# Patient Record
Sex: Female | Born: 1965
Health system: Southern US, Community
[De-identification: ages and names within clinical notes are randomized; demographics above are authoritative.]

## PROBLEM LIST (undated history)

## (undated) DIAGNOSIS — I493 Ventricular premature depolarization: Secondary | ICD-10-CM

## (undated) DIAGNOSIS — I4729 Other ventricular tachycardia: Secondary | ICD-10-CM

## (undated) DIAGNOSIS — R55 Syncope and collapse: Secondary | ICD-10-CM

## (undated) DIAGNOSIS — I700 Atherosclerosis of aorta: Secondary | ICD-10-CM

## (undated) DIAGNOSIS — E119 Type 2 diabetes mellitus without complications: Secondary | ICD-10-CM

## (undated) DIAGNOSIS — K219 Gastro-esophageal reflux disease without esophagitis: Secondary | ICD-10-CM

## (undated) DIAGNOSIS — N182 Chronic kidney disease, stage 2 (mild): Secondary | ICD-10-CM

## (undated) DIAGNOSIS — I480 Paroxysmal atrial fibrillation: Secondary | ICD-10-CM

## (undated) DIAGNOSIS — I4719 Other supraventricular tachycardia: Secondary | ICD-10-CM

## (undated) DIAGNOSIS — K5100 Ulcerative (chronic) pancolitis without complications: Secondary | ICD-10-CM

## (undated) DIAGNOSIS — I10 Essential (primary) hypertension: Secondary | ICD-10-CM

## (undated) DIAGNOSIS — Z780 Asymptomatic menopausal state: Secondary | ICD-10-CM

## (undated) DIAGNOSIS — R001 Bradycardia, unspecified: Secondary | ICD-10-CM

## (undated) DIAGNOSIS — M069 Rheumatoid arthritis, unspecified: Secondary | ICD-10-CM

## (undated) DIAGNOSIS — N952 Postmenopausal atrophic vaginitis: Secondary | ICD-10-CM

## (undated) DIAGNOSIS — G4733 Obstructive sleep apnea (adult) (pediatric): Secondary | ICD-10-CM

## (undated) DIAGNOSIS — K859 Acute pancreatitis without necrosis or infection, unspecified: Secondary | ICD-10-CM

## (undated) DIAGNOSIS — I471 Supraventricular tachycardia: Secondary | ICD-10-CM

## (undated) DIAGNOSIS — I2510 Atherosclerotic heart disease of native coronary artery without angina pectoris: Secondary | ICD-10-CM

## (undated) DIAGNOSIS — K8020 Calculus of gallbladder without cholecystitis without obstruction: Secondary | ICD-10-CM

## (undated) DIAGNOSIS — E785 Hyperlipidemia, unspecified: Secondary | ICD-10-CM

## (undated) DIAGNOSIS — B251 Cytomegaloviral hepatitis: Secondary | ICD-10-CM

## (undated) DIAGNOSIS — E669 Obesity, unspecified: Secondary | ICD-10-CM

## (undated) DIAGNOSIS — I491 Atrial premature depolarization: Secondary | ICD-10-CM

## (undated) DIAGNOSIS — K5190 Ulcerative colitis, unspecified, without complications: Secondary | ICD-10-CM

## (undated) DIAGNOSIS — D49519 Neoplasm of unspecified behavior of unspecified kidney: Secondary | ICD-10-CM

## (undated) HISTORY — DX: Gastro-esophageal reflux disease without esophagitis: K21.9

## (undated) HISTORY — DX: Obstructive sleep apnea (adult) (pediatric): G47.33

## (undated) HISTORY — DX: Cytomegaloviral hepatitis: B25.1

## (undated) HISTORY — DX: Essential (primary) hypertension: I10

## (undated) HISTORY — DX: Atherosclerosis of aorta: I70.0

## (undated) HISTORY — DX: Syncope and collapse: R55

## (undated) HISTORY — DX: Other ventricular tachycardia: I47.29

## (undated) HISTORY — DX: Other supraventricular tachycardia: I47.19

## (undated) HISTORY — DX: Type 2 diabetes mellitus without complications: E11.9

## (undated) HISTORY — DX: Obesity, unspecified: E66.9

## (undated) HISTORY — DX: Asymptomatic menopausal state: Z78.0

## (undated) HISTORY — DX: Bradycardia, unspecified: R00.1

## (undated) HISTORY — DX: Chronic kidney disease, stage 2 (mild): N18.2

## (undated) HISTORY — DX: Atrial premature depolarization: I49.1

## (undated) HISTORY — DX: Rheumatoid arthritis, unspecified: M06.9

## (undated) HISTORY — PX: CATARACT EXTRACTION: SUR2

## (undated) HISTORY — DX: Atherosclerotic heart disease of native coronary artery without angina pectoris: I25.10

## (undated) HISTORY — DX: Ulcerative (chronic) pancolitis without complications: K51.00

## (undated) HISTORY — DX: Hyperlipidemia, unspecified: E78.5

## (undated) HISTORY — DX: Acute pancreatitis without necrosis or infection, unspecified: K85.90

## (undated) HISTORY — DX: Ventricular premature depolarization: I49.3

## (undated) HISTORY — PX: EYE SURGERY: SHX253

## (undated) HISTORY — DX: Neoplasm of unspecified behavior of unspecified kidney: D49.519

## (undated) HISTORY — DX: Paroxysmal atrial fibrillation: I48.0

## (undated) HISTORY — DX: Calculus of gallbladder without cholecystitis without obstruction: K80.20

## (undated) HISTORY — DX: Supraventricular tachycardia: I47.1

## (undated) HISTORY — DX: Postmenopausal atrophic vaginitis: N95.2

## (undated) HISTORY — DX: Ulcerative colitis, unspecified, without complications: K51.90

---

## 2000-07-16 HISTORY — PX: DILATION AND CURETTAGE OF UTERUS: SHX78

## 2000-07-16 HISTORY — PX: TOTAL ABDOMINAL HYSTERECTOMY W/ BILATERAL SALPINGOOPHORECTOMY: SHX83

## 2003-07-17 DIAGNOSIS — B251 Cytomegaloviral hepatitis: Secondary | ICD-10-CM

## 2003-07-17 HISTORY — DX: Cytomegaloviral hepatitis: B25.1

## 2004-03-11 ENCOUNTER — Emergency Department (HOSPITAL_COMMUNITY): Admission: EM | Admit: 2004-03-11 | Discharge: 2004-03-12 | Payer: Self-pay | Admitting: Emergency Medicine

## 2004-08-01 ENCOUNTER — Encounter: Admission: RE | Admit: 2004-08-01 | Discharge: 2004-10-30 | Payer: Self-pay | Admitting: Family Medicine

## 2005-09-20 ENCOUNTER — Encounter: Admission: RE | Admit: 2005-09-20 | Discharge: 2005-09-20 | Payer: Self-pay | Admitting: Family Medicine

## 2005-11-10 ENCOUNTER — Emergency Department (HOSPITAL_COMMUNITY): Admission: EM | Admit: 2005-11-10 | Discharge: 2005-11-10 | Payer: Self-pay | Admitting: Emergency Medicine

## 2006-01-13 DIAGNOSIS — E119 Type 2 diabetes mellitus without complications: Secondary | ICD-10-CM | POA: Insufficient documentation

## 2006-01-13 DIAGNOSIS — N952 Postmenopausal atrophic vaginitis: Secondary | ICD-10-CM | POA: Insufficient documentation

## 2006-01-13 HISTORY — DX: Type 2 diabetes mellitus without complications: E11.9

## 2006-01-13 HISTORY — DX: Postmenopausal atrophic vaginitis: N95.2

## 2006-07-23 ENCOUNTER — Emergency Department (HOSPITAL_COMMUNITY): Admission: EM | Admit: 2006-07-23 | Discharge: 2006-07-23 | Payer: Self-pay | Admitting: Emergency Medicine

## 2006-10-17 ENCOUNTER — Encounter: Admission: RE | Admit: 2006-10-17 | Discharge: 2006-10-17 | Payer: Self-pay | Admitting: Obstetrics and Gynecology

## 2007-05-10 ENCOUNTER — Emergency Department (HOSPITAL_COMMUNITY): Admission: EM | Admit: 2007-05-10 | Discharge: 2007-05-10 | Payer: Self-pay | Admitting: Emergency Medicine

## 2007-07-18 ENCOUNTER — Emergency Department (HOSPITAL_COMMUNITY): Admission: EM | Admit: 2007-07-18 | Discharge: 2007-07-18 | Payer: Self-pay | Admitting: Emergency Medicine

## 2007-10-23 ENCOUNTER — Encounter: Admission: RE | Admit: 2007-10-23 | Discharge: 2007-10-23 | Payer: Self-pay | Admitting: General Practice

## 2007-11-03 ENCOUNTER — Encounter: Admission: RE | Admit: 2007-11-03 | Discharge: 2007-11-03 | Payer: Self-pay | Admitting: Family Medicine

## 2008-07-27 ENCOUNTER — Encounter: Admission: RE | Admit: 2008-07-27 | Discharge: 2008-07-27 | Payer: Self-pay | Admitting: Family Medicine

## 2009-10-25 ENCOUNTER — Encounter: Admission: RE | Admit: 2009-10-25 | Discharge: 2009-10-25 | Payer: Self-pay | Admitting: Obstetrics & Gynecology

## 2010-08-06 ENCOUNTER — Encounter: Payer: Self-pay | Admitting: Family Medicine

## 2010-09-07 ENCOUNTER — Emergency Department (HOSPITAL_BASED_OUTPATIENT_CLINIC_OR_DEPARTMENT_OTHER)
Admission: EM | Admit: 2010-09-07 | Discharge: 2010-09-07 | Disposition: A | Discharge status: Home / Self Care | Payer: 59 | Attending: Emergency Medicine | Admitting: Emergency Medicine

## 2010-09-07 DIAGNOSIS — R197 Diarrhea, unspecified: Secondary | ICD-10-CM | POA: Insufficient documentation

## 2010-09-07 DIAGNOSIS — K219 Gastro-esophageal reflux disease without esophagitis: Secondary | ICD-10-CM | POA: Insufficient documentation

## 2010-09-07 DIAGNOSIS — R112 Nausea with vomiting, unspecified: Secondary | ICD-10-CM | POA: Insufficient documentation

## 2010-09-07 DIAGNOSIS — E119 Type 2 diabetes mellitus without complications: Secondary | ICD-10-CM | POA: Insufficient documentation

## 2010-09-07 DIAGNOSIS — E7800 Pure hypercholesterolemia, unspecified: Secondary | ICD-10-CM | POA: Insufficient documentation

## 2010-09-07 LAB — URINALYSIS, ROUTINE W REFLEX MICROSCOPIC
Bilirubin Urine: NEGATIVE
Ketones, ur: 15 mg/dL — AB
Leukocytes, UA: NEGATIVE
Nitrite: NEGATIVE
Urine Glucose, Fasting: >1000 mg/dL — AB
Urobilinogen, UA: 0.2 mg/dL (ref 0.0–1.0)

## 2010-09-07 LAB — URINE MICROSCOPIC-ADD ON

## 2010-09-07 LAB — BASIC METABOLIC PANEL WITH GFR
BUN: 20 mg/dL (ref 6–23)
CO2: 28 meq/L (ref 19–32)
Calcium: 9 mg/dL (ref 8.4–10.5)
Chloride: 99 meq/L (ref 96–112)
Creatinine, Ser: 0.9 mg/dL (ref 0.4–1.2)
GFR calc Af Amer: >60 mL/min (ref >60)
GFR calc non Af Amer: >60 mL/min (ref >60)
Glucose, Bld: 337 mg/dL — ABNORMAL HIGH (ref 70–99)
Potassium: 4.4 meq/L (ref 3.5–5.1)
Sodium: 138 meq/L (ref 135–145)

## 2010-09-07 LAB — GLUCOSE, CAPILLARY
Glucose-Capillary: 365 mg/dL — ABNORMAL HIGH (ref 70–99)
Glucose-Capillary: 274 mg/dL — ABNORMAL HIGH (ref 70–99)

## 2010-11-28 ENCOUNTER — Other Ambulatory Visit: Payer: Self-pay | Admitting: Nurse Practitioner

## 2010-11-28 DIAGNOSIS — Z1231 Encounter for screening mammogram for malignant neoplasm of breast: Secondary | ICD-10-CM

## 2010-12-13 ENCOUNTER — Ambulatory Visit: Payer: 59

## 2010-12-13 ENCOUNTER — Other Ambulatory Visit: Payer: Self-pay | Admitting: Obstetrics & Gynecology

## 2010-12-13 ENCOUNTER — Ambulatory Visit
Admission: RE | Admit: 2010-12-13 | Discharge: 2010-12-13 | Disposition: A | Discharge status: Home / Self Care | Payer: 59 | Source: Ambulatory Visit | Attending: Nurse Practitioner | Admitting: Nurse Practitioner

## 2010-12-13 DIAGNOSIS — Z1231 Encounter for screening mammogram for malignant neoplasm of breast: Secondary | ICD-10-CM

## 2011-03-22 ENCOUNTER — Ambulatory Visit (HOSPITAL_COMMUNITY)
Admission: RE | Admit: 2011-03-22 | Discharge: 2011-03-22 | Disposition: A | Discharge status: Home / Self Care | Payer: 59 | Source: Ambulatory Visit | Attending: Rheumatology | Admitting: Rheumatology

## 2011-03-22 ENCOUNTER — Other Ambulatory Visit (HOSPITAL_COMMUNITY): Payer: Self-pay | Admitting: Rheumatology

## 2011-03-22 DIAGNOSIS — D869 Sarcoidosis, unspecified: Secondary | ICD-10-CM

## 2011-03-22 DIAGNOSIS — Z111 Encounter for screening for respiratory tuberculosis: Secondary | ICD-10-CM | POA: Insufficient documentation

## 2011-03-27 LAB — HEPATITIS PANEL, ACUTE: HM Hepatitis Screen: NEGATIVE

## 2011-04-05 LAB — BASIC METABOLIC PANEL
BUN: 16
Calcium: 9.2
Chloride: 101
Creatinine, Ser: 0.91
GFR calc Af Amer: >60
GFR calc non Af Amer: >60

## 2011-04-05 LAB — URINE MICROSCOPIC-ADD ON

## 2011-04-05 LAB — CBC
Hemoglobin: 13.3
MCHC: 34.3
Platelets: 228

## 2011-04-05 LAB — URINALYSIS, ROUTINE W REFLEX MICROSCOPIC
Leukocytes, UA: NEGATIVE
Urobilinogen, UA: 1

## 2011-04-05 LAB — DIFFERENTIAL
Eosinophils Absolute: 0
Lymphocytes Relative: 9 — ABNORMAL LOW
Lymphs Abs: 1
Monocytes Absolute: 0.4
Monocytes Relative: 4

## 2012-07-16 DIAGNOSIS — K859 Acute pancreatitis without necrosis or infection, unspecified: Secondary | ICD-10-CM

## 2012-07-16 HISTORY — DX: Acute pancreatitis without necrosis or infection, unspecified: K85.90

## 2012-08-06 ENCOUNTER — Other Ambulatory Visit: Payer: Self-pay | Admitting: Obstetrics & Gynecology

## 2012-08-06 DIAGNOSIS — Z1231 Encounter for screening mammogram for malignant neoplasm of breast: Secondary | ICD-10-CM

## 2012-08-08 ENCOUNTER — Ambulatory Visit
Admission: RE | Admit: 2012-08-08 | Discharge: 2012-08-08 | Disposition: A | Discharge status: Home / Self Care | Payer: 59 | Source: Ambulatory Visit | Attending: Obstetrics & Gynecology | Admitting: Obstetrics & Gynecology

## 2012-08-08 DIAGNOSIS — Z1231 Encounter for screening mammogram for malignant neoplasm of breast: Secondary | ICD-10-CM

## 2012-11-07 ENCOUNTER — Encounter: Payer: Self-pay | Admitting: *Deleted

## 2012-11-10 ENCOUNTER — Encounter: Payer: Self-pay | Admitting: Nurse Practitioner

## 2012-11-10 ENCOUNTER — Ambulatory Visit (INDEPENDENT_AMBULATORY_CARE_PROVIDER_SITE_OTHER): Payer: 59 | Admitting: Nurse Practitioner

## 2012-11-10 VITALS — BP 104/64 | Ht 64.75 in | Wt 232.0 lb

## 2012-11-10 DIAGNOSIS — Z01419 Encounter for gynecological examination (general) (routine) without abnormal findings: Secondary | ICD-10-CM

## 2012-11-10 DIAGNOSIS — N952 Postmenopausal atrophic vaginitis: Secondary | ICD-10-CM

## 2012-11-10 MED ORDER — ESTROGENS, CONJUGATED 0.625 MG/GM VA CREA: 1.0000 g | TOPICAL_CREAM | VAGINAL | Status: DC

## 2012-11-10 MED ORDER — ESTRADIOL 0.5 MG PO TABS: 0.5000 mg | ORAL_TABLET | Freq: Every day | ORAL | Status: DC

## 2012-11-10 NOTE — Patient Instructions (Addendum)

## 2012-11-10 NOTE — Progress Notes (Deleted)
47 y.o. G3P3 Married {Race/ethnicity:17218} Fe here for annual exam.    Patient's last menstrual period was 07/16/2000.          Sexually active: {yes no:314532}  The current method of family planning is {contraception:315051}.    Exercising: {yes no:314532}  {types:19826} Smoker:  {YES NO:22349}  Health Maintenance: Pap:  * MMG:  *** Colonoscopy:  *** BMD:   *** TDaP:  *** Labs: ***   reports that she has never smoked. She does not have any smokeless tobacco history on file. She reports that  drinks alcohol. She reports that she does not use illicit drugs.  Past Medical History  Diagnosis Date  . Depression 01/2006  . Atrophic vaginitis 01/2006  . Diabetes mellitus without complication 01/2006  . Viral hepatitis 2005  . Idiopathic edema 2010  . Hypertension     on meds just to protect kidneys. pt does not have high blood pressure    Past Surgical History  Procedure Laterality Date  . Dilation and curettage of uterus  2002  . Abdominal hysterectomy  2002  . Cesarean section      Current Outpatient Prescriptions  Medication Sig Dispense Refill  . aspirin 325 MG EC tablet Take 325 mg by mouth daily.      . Calcium-Vitamin D-Vitamin K (CALCIUM + D + K PO) Take by mouth daily.      Marland Kitchen estradiol (ESTRACE) 0.5 MG tablet Take 0.5 mg by mouth daily.      . Etanercept (ENBREL [place]) Inject into the skin every 14 (fourteen) days.       . Exenatide (BYETTA 5 MCG PEN [place]) Inject into the skin daily.      Marland Kitchen FOLIC ACID PO Take by mouth. Take 2 a day      . glipiZIDE (GLUCOTROL) 5 MG tablet Take 5 mg by mouth 2 (two) times daily before a meal.      . methotrexate (RHEUMATREX) 2.5 MG tablet Take by mouth. Take 4 pills every other week      . Multiple Vitamins-Minerals (MULTIVITAMIN PO) Take by mouth daily.      . RABEprazole Sodium (ACIPHEX PO) Take by mouth daily.      . ramipril (ALTACE) 5 MG tablet Take 5 mg by mouth daily.       No current facility-administered medications for this  visit.    Family History  Problem Relation Age of Onset  . Diabetes Mother   . Hypertension Mother   . Hypertension Father   . Hypertension Sister   . Diabetes Paternal Aunt   . Cancer Paternal Aunt     bone  . Cancer Maternal Grandmother     ovarian  . Diabetes Maternal Grandmother     ROS:  Pertinent items are noted in HPI.  Otherwise, a comprehensive ROS was negative.  Exam:   BP 104/64  Ht 5' 4.75" (1.645 m)  Wt 232 lb (105.235 kg)  BMI 38.89 kg/m2  LMP 07/16/2000  Weight change: Height: 5' 4.75" (164.5 cm)  Ht Readings from Last 3 Encounters:  11/10/12 5' 4.75" (1.645 m)    General appearance: alert, cooperative and appears stated age Head: Normocephalic, without obvious abnormality, atraumatic Neck: no adenopathy, supple, symmetrical, trachea midline and thyroid {EXAM; THYROID:18604} Lungs: clear to auscultation bilaterally Breasts: {Exam; breast:13139::"normal appearance, no masses or tenderness"} Heart: regular rate and rhythm Abdomen: soft, non-tender; no masses,  no organomegaly Extremities: extremities normal, atraumatic, no cyanosis or edema Skin: Skin color, texture, turgor normal. No  rashes or lesions Lymph nodes: Cervical, supraclavicular, and axillary nodes normal. No abnormal inguinal nodes palpated Neurologic: Grossly normal   Pelvic: External genitalia:  no lesions              Urethra:  normal appearing urethra with no masses, tenderness or lesions              Bartholin's and Skene's: normal                 Vagina: normal appearing vagina with normal color and discharge, no lesions              Cervix: {exam; cervix:14595}              Pap taken: {yes no:314532} Bimanual Exam:  Uterus:  {exam; uterus:12215}              Adnexa: {exam; adnexa:12223}               Rectovaginal: Confirms               Anus:  normal sphincter tone, no lesions  A:  Well Woman with normal exam  P:   Pap smear as per guidelines   {plan; gyn:5269::"mammogram","pap  smear","return annually or prn"}  An After Visit Summary was printed and given to the patient.

## 2012-11-10 NOTE — Progress Notes (Signed)
47 y.o. G3P3 Married Caucasian Fe here for annual exam.  Past week some increase in vaso symptoms that may be related to new introduction of essential oils.  She feels well on ERT and wants to continue.  Dyspareunia is much better on vaginal estrogen.  Patient's last menstrual period was 07/16/2000.          Sexually active: yes  The current method of family planning is status post hysterectomy.    Exercising: yes  treadmill & exercise machines Smoker:  no  Health Maintenance: Pap:  2003 normal MMG:  08/08/12 normal Colonoscopy:  none BMD:   10/17/06 T Score 0.8 spine and 0.6 hip TDaP:  Unknown (will check with PCP) Labs: none -done at PCP   reports that she has never smoked. She does not have any smokeless tobacco history on file. She reports that  drinks alcohol. She reports that she does not use illicit drugs.  Past Medical History  Diagnosis Date  . Depression 01/2006  . Atrophic vaginitis 01/2006  . Diabetes mellitus without complication 01/2006  . Viral hepatitis 2005  . Idiopathic edema 2010  . Hypertension     on meds just to protect kidneys. pt does not have high blood pressure    Past Surgical History  Procedure Laterality Date  . Dilation and curettage of uterus  2002  . Abdominal hysterectomy  2002  . Cesarean section      Current Outpatient Prescriptions  Medication Sig Dispense Refill  . aspirin 325 MG EC tablet Take 325 mg by mouth daily.      . Calcium-Vitamin D-Vitamin K (CALCIUM + D + K PO) Take by mouth daily.      Marland Kitchen estradiol (ESTRACE) 0.5 MG tablet Take 0.5 mg by mouth daily.      . Etanercept (ENBREL [place]) Inject into the skin every 14 (fourteen) days.       . Exenatide (BYETTA 5 MCG PEN [place]) Inject into the skin daily.      Marland Kitchen FOLIC ACID PO Take by mouth. Take 2 a day      . glipiZIDE (GLUCOTROL) 5 MG tablet Take 5 mg by mouth 2 (two) times daily before a meal.      . methotrexate (RHEUMATREX) 2.5 MG tablet Take by mouth. Take 4 pills every other week      .  Multiple Vitamins-Minerals (MULTIVITAMIN PO) Take by mouth daily.      . RABEprazole Sodium (ACIPHEX PO) Take by mouth daily.      . ramipril (ALTACE) 5 MG tablet Take 5 mg by mouth daily.       No current facility-administered medications for this visit.    Family History  Problem Relation Age of Onset  . Diabetes Mother   . Hypertension Mother   . Hypertension Father   . Hypertension Sister   . Diabetes Paternal Aunt   . Cancer Paternal Aunt     bone  . Cancer Maternal Grandmother     ovarian  . Diabetes Maternal Grandmother     ROS:  Pertinent items are noted in HPI.  Otherwise, a comprehensive ROS was negative.  Exam:   BP 104/64  Ht 5' 4.75" (1.645 m)  Wt 232 lb (105.235 kg)  BMI 38.89 kg/m2  LMP 07/16/2000 Height: 5' 4.75" (164.5 cm)  Ht Readings from Last 3 Encounters:  11/10/12 5' 4.75" (1.645 m)    General appearance: alert, cooperative and appears stated age Head: Normocephalic, without obvious abnormality, atraumatic Neck: no adenopathy,  supple, symmetrical, trachea midline and thyroid normal to inspection and palpation Lungs: clear to auscultation bilaterally Breasts: normal appearance, no masses or tenderness Heart: regular rate and rhythm Abdomen: soft, non-tender; no masses,  no organomegaly Extremities: extremities normal, atraumatic, no cyanosis or edema Skin: Skin color, texture, turgor normal. No rashes or lesions Lymph nodes: Cervical, supraclavicular, and axillary nodes normal. No abnormal inguinal nodes palpated Neurologic: Grossly normal   Pelvic: External genitalia:  no lesions              Urethra:  normal appearing urethra with no masses, tenderness or lesions              Bartholin's and Skene's: normal                 Vagina: normal appearing vagina with normal color and discharge, no lesions              Cervix: absent              Pap taken: no Bimanual Exam:  Uterus:  uterus absent              Adnexa: no mass, fullness,  tenderness               Rectovaginal: Confirms               Anus:  normal sphincter tone, no lesions  A:  Well Woman with normal exam  S/P TAH/ BSO secondary to AUB, on ERT  P:   Mammogram is due 07/2013  pap smear not indicated  Reviewed risk of ERT of heart disease, cancer, and CVA. Patient is aware and wants to continue  return annually or prn  An After Visit Summary was printed and given to the patient.

## 2012-11-11 NOTE — Progress Notes (Signed)
Encounter reviewed by Dr. Danyla Wattley Silva.  

## 2013-01-13 DIAGNOSIS — K8020 Calculus of gallbladder without cholecystitis without obstruction: Secondary | ICD-10-CM

## 2013-01-13 HISTORY — DX: Calculus of gallbladder without cholecystitis without obstruction: K80.20

## 2013-01-13 HISTORY — PX: CHOLECYSTECTOMY: SHX55

## 2013-01-13 HISTORY — PX: HERNIA REPAIR: SHX51

## 2013-02-13 HISTORY — PX: PARTIAL NEPHRECTOMY: SHX414

## 2013-03-16 DIAGNOSIS — D49519 Neoplasm of unspecified behavior of unspecified kidney: Secondary | ICD-10-CM

## 2013-03-16 HISTORY — PX: OTHER SURGICAL HISTORY: SHX169

## 2013-03-16 HISTORY — DX: Neoplasm of unspecified behavior of unspecified kidney: D49.519

## 2013-04-13 DIAGNOSIS — Z905 Acquired absence of kidney: Secondary | ICD-10-CM | POA: Insufficient documentation

## 2013-04-15 HISTORY — PX: OTHER SURGICAL HISTORY: SHX169

## 2013-05-29 ENCOUNTER — Other Ambulatory Visit: Payer: Self-pay

## 2013-05-29 DIAGNOSIS — Z1231 Encounter for screening mammogram for malignant neoplasm of breast: Secondary | ICD-10-CM

## 2013-07-16 DIAGNOSIS — K5100 Ulcerative (chronic) pancolitis without complications: Secondary | ICD-10-CM

## 2013-07-16 DIAGNOSIS — K5190 Ulcerative colitis, unspecified, without complications: Secondary | ICD-10-CM

## 2013-07-16 HISTORY — DX: Ulcerative (chronic) pancolitis without complications: K51.00

## 2013-07-16 HISTORY — DX: Ulcerative colitis, unspecified, without complications: K51.90

## 2013-07-16 HISTORY — PX: COLONOSCOPY: SHX174

## 2013-08-10 ENCOUNTER — Ambulatory Visit
Admission: RE | Admit: 2013-08-10 | Discharge: 2013-08-10 | Disposition: A | Discharge status: Home / Self Care | Payer: 59 | Source: Ambulatory Visit

## 2013-08-10 DIAGNOSIS — Z1231 Encounter for screening mammogram for malignant neoplasm of breast: Secondary | ICD-10-CM

## 2013-08-29 ENCOUNTER — Encounter: Payer: Self-pay | Admitting: Cardiology

## 2013-08-29 DIAGNOSIS — B251 Cytomegaloviral hepatitis: Secondary | ICD-10-CM | POA: Insufficient documentation

## 2013-09-03 ENCOUNTER — Ambulatory Visit: Payer: 59 | Admitting: Cardiology

## 2013-11-16 ENCOUNTER — Ambulatory Visit: Payer: 59 | Admitting: Nurse Practitioner

## 2013-11-16 ENCOUNTER — Encounter: Payer: Self-pay | Admitting: Nurse Practitioner

## 2014-01-20 ENCOUNTER — Emergency Department (HOSPITAL_COMMUNITY)
Admission: EM | Admit: 2014-01-20 | Discharge: 2014-01-20 | Disposition: A | Discharge status: Home / Self Care | Payer: 59 | Attending: Emergency Medicine | Admitting: Emergency Medicine

## 2014-01-20 ENCOUNTER — Encounter (HOSPITAL_COMMUNITY): Payer: Self-pay | Admitting: Emergency Medicine

## 2014-01-20 ENCOUNTER — Emergency Department (HOSPITAL_COMMUNITY): Payer: 59

## 2014-01-20 DIAGNOSIS — Z8619 Personal history of other infectious and parasitic diseases: Secondary | ICD-10-CM | POA: Insufficient documentation

## 2014-01-20 DIAGNOSIS — Z79899 Other long term (current) drug therapy: Secondary | ICD-10-CM | POA: Insufficient documentation

## 2014-01-20 DIAGNOSIS — Z8719 Personal history of other diseases of the digestive system: Secondary | ICD-10-CM | POA: Insufficient documentation

## 2014-01-20 DIAGNOSIS — Z8742 Personal history of other diseases of the female genital tract: Secondary | ICD-10-CM | POA: Insufficient documentation

## 2014-01-20 DIAGNOSIS — R112 Nausea with vomiting, unspecified: Secondary | ICD-10-CM

## 2014-01-20 DIAGNOSIS — R197 Diarrhea, unspecified: Secondary | ICD-10-CM

## 2014-01-20 DIAGNOSIS — E119 Type 2 diabetes mellitus without complications: Secondary | ICD-10-CM | POA: Insufficient documentation

## 2014-01-20 DIAGNOSIS — R1013 Epigastric pain: Secondary | ICD-10-CM | POA: Insufficient documentation

## 2014-01-20 DIAGNOSIS — F411 Generalized anxiety disorder: Secondary | ICD-10-CM | POA: Insufficient documentation

## 2014-01-20 DIAGNOSIS — R109 Unspecified abdominal pain: Secondary | ICD-10-CM

## 2014-01-20 DIAGNOSIS — Z7982 Long term (current) use of aspirin: Secondary | ICD-10-CM | POA: Insufficient documentation

## 2014-01-20 DIAGNOSIS — R911 Solitary pulmonary nodule: Secondary | ICD-10-CM

## 2014-01-20 LAB — URINE MICROSCOPIC-ADD ON

## 2014-01-20 LAB — CBC
HCT: 43 % (ref 36.0–46.0)
Hemoglobin: 14.6 g/dL (ref 12.0–15.0)
MCH: 29.4 pg (ref 26.0–34.0)
MCHC: 34 g/dL (ref 30.0–36.0)
MCV: 86.7 fL (ref 78.0–100.0)
Platelets: 276 10*3/uL (ref 150–400)
RBC: 4.96 MIL/uL (ref 3.87–5.11)
RDW: 15.6 % — AB (ref 11.5–15.5)
WBC: 21.4 10*3/uL — ABNORMAL HIGH (ref 4.0–10.5)

## 2014-01-20 LAB — COMPREHENSIVE METABOLIC PANEL
ALBUMIN: 3.8 g/dL (ref 3.5–5.2)
ALT: 31 U/L (ref 0–35)
AST: 26 U/L (ref 0–37)
Alkaline Phosphatase: 77 U/L (ref 39–117)
Anion gap: 19 — ABNORMAL HIGH (ref 5–15)
BUN: 25 mg/dL — ABNORMAL HIGH (ref 6–23)
CO2: 22 mEq/L (ref 19–32)
CREATININE: 1.24 mg/dL — AB (ref 0.50–1.10)
Calcium: 9.7 mg/dL (ref 8.4–10.5)
Chloride: 97 mEq/L (ref 96–112)
GFR calc non Af Amer: 51 mL/min — ABNORMAL LOW (ref >90)
GFR, EST AFRICAN AMERICAN: 59 mL/min — AB (ref >90)
Glucose, Bld: 356 mg/dL — ABNORMAL HIGH (ref 70–99)
Potassium: 3.7 mEq/L (ref 3.7–5.3)
Sodium: 138 mEq/L (ref 137–147)
Total Bilirubin: 0.5 mg/dL (ref 0.3–1.2)
Total Protein: 7.6 g/dL (ref 6.0–8.3)

## 2014-01-20 LAB — URINALYSIS, ROUTINE W REFLEX MICROSCOPIC
BILIRUBIN URINE: NEGATIVE
Glucose, UA: 100 mg/dL — AB
Hgb urine dipstick: NEGATIVE
Ketones, ur: NEGATIVE mg/dL
NITRITE: NEGATIVE
Protein, ur: NEGATIVE mg/dL
SPECIFIC GRAVITY, URINE: 1.021 (ref 1.005–1.030)
Urobilinogen, UA: 0.2 mg/dL (ref 0.0–1.0)
pH: 5 (ref 5.0–8.0)

## 2014-01-20 LAB — LIPASE, BLOOD: LIPASE: 26 U/L (ref 11–59)

## 2014-01-20 MED ORDER — EPINEPHRINE 0.3 MG/0.3ML IJ SOAJ: 0.3000 mg | INTRAMUSCULAR | Status: DC | PRN

## 2014-01-20 MED ORDER — ONDANSETRON 8 MG PO TBDP: ORAL_TABLET | ORAL | Status: DC

## 2014-01-20 MED ORDER — SODIUM CHLORIDE 0.9 % IV BOLUS (SEPSIS)
1000.0000 mL | Freq: Once | INTRAVENOUS | Status: AC
  Administered 2014-01-20: 1000 mL via INTRAVENOUS

## 2014-01-20 MED ORDER — ONDANSETRON HCL 4 MG/2ML IJ SOLN
4.0000 mg | Freq: Once | INTRAMUSCULAR | Status: AC
  Administered 2014-01-20: 4 mg via INTRAVENOUS
  Filled 2014-01-20: qty 2

## 2014-01-20 MED ORDER — HYDROXYZINE HCL 25 MG PO TABS: 25.0000 mg | ORAL_TABLET | Freq: Four times a day (QID) | ORAL | Status: DC

## 2014-01-20 MED ORDER — SODIUM CHLORIDE 0.9 % IV BOLUS (SEPSIS)
500.0000 mL | Freq: Once | INTRAVENOUS | Status: AC
  Administered 2014-01-20: 500 mL via INTRAVENOUS

## 2014-01-20 MED ORDER — LORAZEPAM 2 MG/ML IJ SOLN
1.0000 mg | Freq: Once | INTRAMUSCULAR | Status: AC
  Administered 2014-01-20: 0.5 mg via INTRAVENOUS
  Filled 2014-01-20: qty 1

## 2014-01-20 NOTE — ED Provider Notes (Signed)
Medical screening examination/treatment/procedure(s) were performed by non-physician practitioner and as supervising physician I was immediately available for consultation/collaboration.     Veryl Speak, MD 01/20/14 2312

## 2014-01-20 NOTE — ED Provider Notes (Signed)
Courtney Olson S 8:00 PM patient discussed in sign out. Patient with acute episodes of vomiting and watery diarrhea. 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 She also has past history of cholecystectomy and a right renal cyst removal. No recent travel or sick contacts. No fever. Patient does have elevated WBC. CT scan pending to rule out any concerning cause of symptoms.  9:30 PM CT scan discussed with attending physician. Nonspecific serous fluid collection around the liver. Very slight inflammation around the rectum without other signs of acute colitis. At this time may discuss fluid with GI for any thoughts.   9:40 PM spoke with [facility]. He feels fluid is most likely postsurgical from renal cyst removal one year ago and unlikely infectious in nature. He is unsure about possible "gastric anaphylaxis" but does state her some conditions when there is abdominal angioedema and swelling of the intestines which may release causing diarrhea but this is usually seen on CT. He does recommend adding a GI pathogen stool panel if patient is able to produce a sample. I will also plan to consult general surgery for any recommendations about CT scan findings.  10:05 PM spoke with Dr. Brantley Stage with general surgery. He has reviewed the CT scan we discussed patient's history. He feels this is probably a chronic fluid collection related to the surgery. He feels there is probably low chances this has become infected. He recommends that the patient have a recheck of symptoms and white blood cell count in the next one to 2 days. If it is not trending down he would recommend having IR collect fluid for sampling. Otherwise there is no recommendations for intervention on this finding.  10:15 PM I spoke with patient about CT scan findings including fluid collection which appears chronic post surgery as well a small 3 mm lung nodule. She continues to state that her symptoms feel most  similar to her "gastric anaphylaxis". She states she usually improves the best with Benadryl and hydroxyzine. Will provide a prescription for hydroxyzine as well as Zofran for nausea and vomiting. She will be on a soft diet. She has been instructed to have a recheck of her WBC and 1-2 days. Strict return precautions given.  Martie Lee, PA-C 01/20/14 2227

## 2014-01-20 NOTE — Discharge Instructions (Signed)
Rest. Drink plenty of fluids. Follow up with your primary care doctor tomorrow to have a recheck of your white blood cell count. Please also discussed with your primary care doctor followup regarding your small lung nodule.  Return to ER right away if worse, persistent vomiting, fevers, weak/faint, worsening or severe abdominal pain, other concern.   Also, follow up with your doctor to discuss facilitating referral to [facility] specialist, and Allergy specialist.     Nausea and Vomiting Nausea is a sick feeling that often comes before throwing up (vomiting). Vomiting is a reflex where stomach contents come out of your mouth. Vomiting can cause severe loss of body fluids (dehydration). Children and elderly adults can become dehydrated quickly, especially if they also have diarrhea. Nausea and vomiting are symptoms of a condition or disease. It is important to find the cause of your symptoms. CAUSES   Direct irritation of the stomach lining. This irritation can result from increased acid production (gastroesophageal reflux disease), infection, food poisoning, taking certain medicines (such as nonsteroidal anti-inflammatory drugs), alcohol use, or tobacco use.  Signals from the brain.These signals could be caused by a headache, heat exposure, an inner ear disturbance, increased pressure in the brain from injury, infection, a tumor, or a concussion, pain, emotional stimulus, or metabolic problems.  An obstruction in the gastrointestinal tract (bowel obstruction).  Illnesses such as diabetes, hepatitis, gallbladder problems, appendicitis, kidney problems, cancer, sepsis, atypical symptoms of a heart attack, or eating disorders.  Medical treatments such as chemotherapy and radiation.  Receiving medicine that makes you sleep (general anesthetic) during surgery. DIAGNOSIS Your caregiver may ask for tests to be done if the problems do not improve after a few days. Tests may also be done if symptoms are  severe or if the reason for the nausea and vomiting is not clear. Tests may include:  Urine tests.  Blood tests.  Stool tests.  Cultures (to look for evidence of infection).  X-rays or other imaging studies. Test results can help your caregiver make decisions about treatment or the need for additional tests. TREATMENT You need to stay well hydrated. Drink frequently but in small amounts.You may wish to drink water, sports drinks, clear broth, or eat frozen ice pops or gelatin dessert to help stay hydrated.When you eat, eating slowly may help prevent nausea.There are also some antinausea medicines that may help prevent nausea. HOME CARE INSTRUCTIONS   Take all medicine as directed by your caregiver.  If you do not have an appetite, do not force yourself to eat. However, you must continue to drink fluids.  If you have an appetite, eat a normal diet unless your caregiver tells you differently.  Eat a variety of complex carbohydrates (rice, wheat, potatoes, bread), lean meats, yogurt, fruits, and vegetables.  Avoid high-fat foods because they are more difficult to digest.  Drink enough water and fluids to keep your urine clear or pale yellow.  If you are dehydrated, ask your caregiver for specific rehydration instructions. Signs of dehydration may include:  Severe thirst.  Dry lips and mouth.  Dizziness.  Dark urine.  Decreasing urine frequency and amount.  Confusion.  Rapid breathing or pulse. SEEK IMMEDIATE MEDICAL CARE IF:   You have blood or brown flecks (like coffee grounds) in your vomit.  You have black or bloody stools.  You have a severe headache or stiff neck.  You are confused.  You have severe abdominal pain.  You have chest pain or trouble breathing.  You do not urinate  at least once every 8 hours.  You develop cold or clammy skin.  You continue to vomit for longer than 24 to 48 hours.  You have a fever. MAKE SURE YOU:   Understand these  instructions.  Will watch your condition.  Will get help right away if you are not doing well or get worse. Document Released: 07/02/2005 Document Revised: 09/24/2011 Document Reviewed: 11/29/2010 [facility] Patient Information 2015 [place], [place]. This information is not intended to replace advice given to you by your health care provider. Make sure you discuss any questions you have with your health care provider.     Diarrhea Diarrhea is frequent loose and watery bowel movements. It can cause you to feel weak and dehydrated. Dehydration can cause you to become tired and thirsty, have a dry mouth, and have decreased urination that often is dark yellow. Diarrhea is a sign of another problem, most often an infection that will not last long. In most cases, diarrhea typically lasts 2-3 days. However, it can last longer if it is a sign of something more serious. It is important to treat your diarrhea as directed by your caregive to lessen or prevent future episodes of diarrhea. CAUSES  Some common causes include:  Gastrointestinal infections caused by viruses, bacteria, or parasites.  Food poisoning or food allergies.  Certain medicines, such as antibiotics, chemotherapy, and laxatives.  Artificial sweeteners and fructose.  Digestive disorders. HOME CARE INSTRUCTIONS  Ensure adequate fluid intake (hydration): have 1 cup (8 oz) of fluid for each diarrhea episode. Avoid fluids that contain simple sugars or sports drinks, fruit juices, whole milk products, and sodas. Your urine should be clear or pale yellow if you are drinking enough fluids. Hydrate with an oral rehydration solution that you can purchase at pharmacies, retail stores, and online. You can prepare an oral rehydration solution at home by mixing the following ingredients together:   - tsp table salt.   tsp baking soda.   tsp salt substitute containing potassium chloride.  1  tablespoons sugar.  1 L (34 oz) of  water.  Certain foods and beverages may increase the speed at which food moves through the gastrointestinal (GI) tract. These foods and beverages should be avoided and include:  Caffeinated and alcoholic beverages.  High-fiber foods, such as raw fruits and vegetables, nuts, seeds, and whole grain breads and cereals.  Foods and beverages sweetened with sugar alcohols, such as xylitol, sorbitol, and mannitol.  Some foods may be well tolerated and may help thicken stool including:  Starchy foods, such as rice, toast, pasta, low-sugar cereal, oatmeal, grits, baked potatoes, crackers, and bagels.  Bananas.  Applesauce.  Add probiotic-rich foods to help increase healthy bacteria in the GI tract, such as yogurt and fermented milk products.  Wash your hands well after each diarrhea episode.  Only take over-the-counter or prescription medicines as directed by your caregiver.  Take a warm bath to relieve any burning or pain from frequent diarrhea episodes. SEEK IMMEDIATE MEDICAL CARE IF:   You are unable to keep fluids down.  You have persistent vomiting.  You have blood in your stool, or your stools are black and tarry.  You do not urinate in 6-8 hours, or there is only a small amount of very dark urine.  You have abdominal pain that increases or localizes.  You have weakness, dizziness, confusion, or lightheadedness.  You have a severe headache.  Your diarrhea gets worse or does not get better.  You have a fever or  persistent symptoms for more than 2-3 days.  You have a fever and your symptoms suddenly get worse. MAKE SURE YOU:   Understand these instructions.  Will watch your condition.  Will get help right away if you are not doing well or get worse. Document Released: 06/22/2002 Document Revised: 06/18/2012 Document Reviewed: 03/09/2012 [facility] Patient Information 2015 [place], [place]. This information is not intended to replace advice given to you by your health  care provider. Make sure you discuss any questions you have with your health care provider.     Abdominal Pain Many things can cause abdominal pain. Usually, abdominal pain is not caused by a disease and will improve without treatment. It can often be observed and treated at home. Your health care provider will do a physical exam and possibly order blood tests and X-rays to help determine the seriousness of your pain. However, in many cases, more time must pass before a clear cause of the pain can be found. Before that point, your health care provider may not know if you need more testing or further treatment. HOME CARE INSTRUCTIONS  Monitor your abdominal pain for any changes. The following actions may help to alleviate any discomfort you are experiencing:  Only take over-the-counter or prescription medicines as directed by your health care provider.  Do not take laxatives unless directed to do so by your health care provider.  Try a clear liquid diet (broth, tea, or water) as directed by your health care provider. Slowly move to a bland diet as tolerated. SEEK MEDICAL CARE IF:  You have unexplained abdominal pain.  You have abdominal pain associated with nausea or diarrhea.  You have pain when you urinate or have a bowel movement.  You experience abdominal pain that wakes you in the night.  You have abdominal pain that is worsened or improved by eating food.  You have abdominal pain that is worsened with eating fatty foods.  You have a fever. SEEK IMMEDIATE MEDICAL CARE IF:   Your pain does not go away within 2 hours.  You keep throwing up (vomiting).  Your pain is felt only in portions of the abdomen, such as the right side or the left lower portion of the abdomen.  You pass bloody or black tarry stools. MAKE SURE YOU:  Understand these instructions.   Will watch your condition.   Will get help right away if you are not doing well or get worse.  Document Released:  04/11/2005 Document Revised: 07/07/2013 Document Reviewed: 03/11/2013 [facility] Patient Information 2015 [place], [place]. This information is not intended to replace advice given to you by your health care provider. Make sure you discuss any questions you have with your health care provider.   Pulmonary Nodule  A pulmonary nodule is a small, round spot in your lung. It is usually found when pictures of your lungs are taken for other reasons. Most pulmonary nodules are not cancerous and do not cause symptoms. Tests will be done to make sure the nodule is not cancerous. Pulmonary nodules that are not cancerous usually do not require treatment. HOME CARE   Only take medicine as told by your doctor.  Follow up with your doctor as told. GET HELP IF:  You have trouble breathing when doing activities.  You feel sick or more tired than normal.  You do not feel like eating.  You lose weight without trying to.  You have chills.  You have night sweats. GET HELP RIGHT AWAY IF:  You cannot  catch your breath.  You start making whistling sounds when breathing (wheezing).  You have a cough that does not go away.  You cough up blood.  You are dizzy or feel like you are going to pass out.  You have sudden chest pain.  You have a fever or lasting symptoms for more than 2-3 days.  You have a fever and your symptoms suddenly get worse. MAKE SURE YOU:  Understand these instructions.  Will watch your condition.  Will get help right away if you are not doing well or get worse. Document Released: 08/04/2010 Document Revised: 03/04/2013 Document Reviewed: 12/22/2012 [facility] Patient Information 2015 [place], [place]. This information is not intended to replace advice given to you by your health care provider. Make sure you discuss any questions you have with your health care provider.
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
## 2014-01-20 NOTE — ED Notes (Signed)
Discharge instructions reviewed with pt. Pt verbalized understanding.   

## 2014-01-20 NOTE — ED Notes (Signed)
Pt presents to department via [facility] for evaluation of nausea/vomiting and diarrhea. History of GI issues. Hypotensive upon EMS arrival, 80/50. Received Epinephrine, Benadryl and Zantac. No relief of symptoms after receiving medications. Pt is alert and oriented x4. 20g L forearm.

## 2014-01-21 LAB — GI PATHOGEN PANEL BY PCR, STOOL
C difficile toxin A/B: NEGATIVE
CAMPYLOBACTER BY PCR: NEGATIVE
CRYPTOSPORIDIUM BY PCR: NEGATIVE
E COLI (ETEC) LT/ST: NEGATIVE
E COLI (STEC): NEGATIVE
E COLI 0157 BY PCR: NEGATIVE
G lamblia by PCR: NEGATIVE
Norovirus GI/GII: NEGATIVE
Rotavirus A by PCR: NEGATIVE
Salmonella by PCR: NEGATIVE
Shigella by PCR: NEGATIVE

## 2014-04-01 ENCOUNTER — Encounter: Payer: Self-pay | Admitting: Cardiology

## 2014-04-01 ENCOUNTER — Ambulatory Visit (INDEPENDENT_AMBULATORY_CARE_PROVIDER_SITE_OTHER): Payer: 59 | Admitting: Cardiology

## 2014-04-01 VITALS — BP 110/70 | HR 59 | Ht 64.75 in | Wt 201.0 lb

## 2014-04-01 DIAGNOSIS — Z0389 Encounter for observation for other suspected diseases and conditions ruled out: Secondary | ICD-10-CM

## 2014-04-01 DIAGNOSIS — G4733 Obstructive sleep apnea (adult) (pediatric): Secondary | ICD-10-CM | POA: Insufficient documentation

## 2014-04-01 DIAGNOSIS — R079 Chest pain, unspecified: Secondary | ICD-10-CM

## 2014-04-01 DIAGNOSIS — I10 Essential (primary) hypertension: Secondary | ICD-10-CM

## 2014-04-01 DIAGNOSIS — R072 Precordial pain: Secondary | ICD-10-CM | POA: Insufficient documentation

## 2014-04-01 DIAGNOSIS — E785 Hyperlipidemia, unspecified: Secondary | ICD-10-CM | POA: Insufficient documentation

## 2014-04-01 DIAGNOSIS — R55 Syncope and collapse: Secondary | ICD-10-CM

## 2014-04-01 DIAGNOSIS — K219 Gastro-esophageal reflux disease without esophagitis: Secondary | ICD-10-CM | POA: Insufficient documentation

## 2014-04-01 DIAGNOSIS — R002 Palpitations: Secondary | ICD-10-CM

## 2014-04-01 DIAGNOSIS — M069 Rheumatoid arthritis, unspecified: Secondary | ICD-10-CM | POA: Insufficient documentation

## 2014-04-01 NOTE — Patient Instructions (Signed)
Your physician recommends that you continue on your current medications as directed. Please refer to the Current Medication list given to you today.  Your physician has requested that you have an exercise tolerance test. For further information please visit [email or web address]. Please also follow instruction sheet, as given.  Your physician has requested that you have an echocardiogram. Echocardiography is a painless test that uses sound waves to create images of your heart. It provides your doctor with information about the size and shape of your heart and how well your heart's chambers and valves are working. This procedure takes approximately one hour. There are no restrictions for this procedure.  Your physician has recommended that you wear an event monitor. Event monitors are medical devices that record the heart's electrical activity. Doctors most often [place] these monitors to diagnose arrhythmias. Arrhythmias are problems with the speed or rhythm of the heartbeat. The monitor is a small, portable device. You can wear one while you do your normal daily activities. This is usually used to diagnose what is causing palpitations/syncope (passing out).  Your physician recommends that you schedule a follow-up appointment As Needed Pending Test results.

## 2014-04-01 NOTE — Progress Notes (Signed)
[place], [address], [place]  [address] Phone: [phone] Fax:  [phone]  Date:  04/01/2014   ID:  Courtney Olson, DOB [birth year], MRN [id number]  PCP:  Elton Sin, FNP  Cardiologist:  Fransico Him, MD     History of Present Illness: This is a 48yo female with a history of HTN, DM and remote history of chest pain with negative workup with stress test and echo.  She now presents back for followup.  She has had pancreatitis since I saw her last and was diagnosed with a kidney mass and underwent a large resection of a benign mass complicated by multiple infections.  This past July, she had an episode of severe abdominal pain with severe diarrhea and then became very diaphoretic with the pain and then got dizzy an d passed out. Apparently she has chronic issues with abdominal pain and diarrhea.   A CT scan was done to evaluate and she was also noted at that time to have calcifications of her abdominal aorta but no aneurysm.  She was concerned about the aorta and decided to followup with me.  She has had some anxiety attacks with associated chest pain after getting very upset about an abnormal CT scan done recently to followup on her kidney and is going to see a new urologist today.  Her symptoms have seemed to start after getting the CT scan.     Wt Readings from Last 3 Encounters:  04/01/14 201 lb (91.173 kg)  11/10/12 232 lb (105.235 kg)     Past Medical History  Diagnosis Date  . Atrophic vaginitis 01/2006  . Diabetes mellitus without complication 03/4495    on Ace inhibitor to protect kidneys  . Cytomegaloviral hepatitis 2005  . Hyperlipidemia   . Hypertension   . OSA (obstructive sleep apnea)   . RA (rheumatoid arthritis)   . GERD (gastroesophageal reflux disease)     Current Outpatient Prescriptions  Medication Sig Dispense Refill  . aspirin 325 MG EC tablet Take 325 mg by mouth daily.      Marland Kitchen EPINEPHrine (EPIPEN) 0.3 mg/0.3 mL IJ SOAJ injection Inject 0.3 mLs  (0.3 mg total) into the muscle as needed.  1 Device  0  . exenatide (BYETTA) 5 MCG/0.02ML SOPN injection Inject 10 mcg into the skin 2 (two) times daily with a meal.       . glipiZIDE (GLUCOTROL) 5 MG tablet Take 5 mg by mouth 2 (two) times daily before a meal.      . hydrochlorothiazide (MICROZIDE) 12.5 MG capsule Take 12.5 mg by mouth daily.       . hydrOXYzine (ATARAX/VISTARIL) 25 MG tablet Take 25 mg by mouth every 6 (six) hours. For Allergic reactions      . Multiple Vitamins-Minerals (MULTIVITAMIN PO) Take 1 tablet by mouth daily.       Marland Kitchen omeprazole (PRILOSEC) 40 MG capsule Take 40 mg by mouth daily.       . ondansetron (ZOFRAN ODT) 8 MG disintegrating tablet 8mg  ODT q4 hours prn nausea  20 tablet  0  . ramipril (ALTACE) 5 MG tablet Take 5 mg by mouth daily.      . Rosuvastatin Calcium (CRESTOR PO) Take 1 tablet by mouth daily.       No current facility-administered medications for this visit.    Allergies:    Allergies  Allergen Reactions  . Ibuprofen Nausea Only    Coating on tablet  . Orange Fruit [Citrus] Anaphylaxis  .  Tape Rash    Social History:  The patient  reports that she has never smoked. She does not have any smokeless tobacco history on file. She reports that she drinks alcohol. She reports that she does not use illicit drugs.   Family History:  The patient's family history includes Cancer in her maternal grandmother and paternal aunt; Diabetes in her father, maternal grandmother, mother, and paternal aunt; Heart disease in her father; Hypertension in her father, mother, and sister.   ROS:  Please see the history of present illness.      All other systems reviewed and negative.   PHYSICAL EXAM: VS:  BP 110/70  Pulse 59  Ht 5' 4.75" (1.645 m)  Wt 201 lb (91.173 kg)  BMI 33.69 kg/m2  LMP 07/16/2000 Well nourished, well developed, in no acute distress HEENT: normal Neck: no JVD Cardiac:  normal S1, S2; RRR; no murmur Lungs:  clear to auscultation bilaterally,  no wheezing, rhonchi or rales Abd: soft, nontender, no hepatomegaly Ext: no edema Skin: warm and dry Neuro:  CNs 2-12 intact, no focal abnormalities noted  EKG:  Sinus bradycardia with no ST changes     ASSESSMENT AND PLAN: 1.  Aortic calcifications with no aneurysm seen on abdominal CT- continue ASA 2.  Palpitations which are most likely related to panic attacks - event monitor to assess for arrhythmias 3.  Chest pain - atypical and is most likely related to panic attacks - ETT to rule out ischemia - 2D echo to assess LVF 4.  Syncope which sounds vasovagal triggered by abdominal pain - she has had several episodes like this in the past related to pain.  I explained to her that she needs to followup with her PCP to try to get her abdominal cramping under control.   5. Renal mass s/p resection with multiple complications and is followed by urology 6.  DM without complication 7.  HTN well controlled- continue HCTZ/Altace 8.  Dyslipidemia - continue Crestor  I spent over 35 minutes in direct patient care reviewing her CT scan reports and recent past medical history this year with multiple surgeries and hospitalizations.  She had multiple complaints today requiring significant time reviewing.  Followup PRN pending results of study  Signed, Fransico Him, MD 04/01/2014 9:30 AM

## 2014-04-06 ENCOUNTER — Encounter (INDEPENDENT_AMBULATORY_CARE_PROVIDER_SITE_OTHER): Payer: 59

## 2014-04-06 ENCOUNTER — Ambulatory Visit (HOSPITAL_COMMUNITY): Payer: 59 | Attending: Internal Medicine

## 2014-04-06 ENCOUNTER — Encounter: Payer: Self-pay | Admitting: *Deleted

## 2014-04-06 DIAGNOSIS — K219 Gastro-esophageal reflux disease without esophagitis: Secondary | ICD-10-CM | POA: Diagnosis not present

## 2014-04-06 DIAGNOSIS — M069 Rheumatoid arthritis, unspecified: Secondary | ICD-10-CM | POA: Insufficient documentation

## 2014-04-06 DIAGNOSIS — R002 Palpitations: Secondary | ICD-10-CM | POA: Diagnosis not present

## 2014-04-06 DIAGNOSIS — R072 Precordial pain: Secondary | ICD-10-CM | POA: Diagnosis not present

## 2014-04-06 DIAGNOSIS — I10 Essential (primary) hypertension: Secondary | ICD-10-CM | POA: Insufficient documentation

## 2014-04-06 DIAGNOSIS — R079 Chest pain, unspecified: Secondary | ICD-10-CM

## 2014-04-06 DIAGNOSIS — E119 Type 2 diabetes mellitus without complications: Secondary | ICD-10-CM | POA: Insufficient documentation

## 2014-04-06 DIAGNOSIS — I059 Rheumatic mitral valve disease, unspecified: Secondary | ICD-10-CM | POA: Insufficient documentation

## 2014-04-06 DIAGNOSIS — E785 Hyperlipidemia, unspecified: Secondary | ICD-10-CM | POA: Diagnosis not present

## 2014-04-06 DIAGNOSIS — G4733 Obstructive sleep apnea (adult) (pediatric): Secondary | ICD-10-CM | POA: Diagnosis not present

## 2014-04-06 DIAGNOSIS — R911 Solitary pulmonary nodule: Secondary | ICD-10-CM | POA: Diagnosis not present

## 2014-04-06 NOTE — Progress Notes (Signed)
2D Echo completed. 04/06/2014

## 2014-04-06 NOTE — Progress Notes (Signed)
Patient ID: Courtney Olson, female   DOB: 1966-01-11, 48 y.o.   MRN: [id number] [employer] 30 day cardiac event monitor applied to patient.

## 2014-04-15 ENCOUNTER — Telehealth (HOSPITAL_COMMUNITY): Payer: Self-pay

## 2014-04-15 NOTE — Telephone Encounter (Signed)
Encounter complete. 

## 2014-04-21 ENCOUNTER — Ambulatory Visit (HOSPITAL_COMMUNITY)
Admission: RE | Admit: 2014-04-21 | Discharge: 2014-04-21 | Disposition: A | Discharge status: Home / Self Care | Payer: 59 | Source: Ambulatory Visit | Attending: Cardiovascular Disease | Admitting: Cardiovascular Disease

## 2014-04-21 DIAGNOSIS — R079 Chest pain, unspecified: Secondary | ICD-10-CM | POA: Diagnosis present

## 2014-04-21 NOTE — Procedures (Signed)
Exercise Treadmill Test  Pre-Exercise Testing Evaluation   Test  Exercise Tolerance Test Ordering MD: Fransico Him, MD    Unique Test No: 1  Treadmill:  1  Indication for ETT: chest pain - rule out ischemia  Contraindication to ETT: No   Stress Modality: exercise - treadmill  Cardiac Imaging Performed: non   Protocol: standard Bruce - maximal  Max BP:  178/84  Max MPHR (bpm):  172 85% MPR (bpm):  146  MPHR obtained (bpm):  155 % MPHR obtained:  90  Reached 85% MPHR (min:sec):  6:55 Total Exercise Time (min-sec):  7:30  Workload in METS:  9.3 Borg Scale: 16  Reason ETT Terminated:  SOB and Leg fatigue    ST Segment Analysis At Rest: normal ST segments - no evidence of significant ST depression With Exercise: no evidence of significant ST depression  Other Information Arrhythmia:  No Angina during ETT:  absent (0) Quality of ETT:  diagnostic  ETT Interpretation:  normal - no evidence of ischemia by ST analysis  Comments: The patient had an fair exercise tolerance.  There was no chest pain.  There was an appropriate level of dyspnea.  There were rare PVCs, a normal heart rate response and normal BP response.  There were no ischemic ST T wave changes and a normal heart rate recovery.  The Duke score was 8 which puts him in the low risk category.   Recommendations: Negative stress test.  Further plans per Dr. Radford Pax.

## 2014-04-23 ENCOUNTER — Encounter: Payer: Self-pay | Admitting: *Deleted

## 2014-05-03 ENCOUNTER — Other Ambulatory Visit (HOSPITAL_COMMUNITY): Payer: Self-pay | Admitting: *Deleted

## 2014-05-03 ENCOUNTER — Other Ambulatory Visit (HOSPITAL_COMMUNITY): Payer: Self-pay | Admitting: Family Medicine

## 2014-05-03 DIAGNOSIS — Z87448 Personal history of other diseases of urinary system: Secondary | ICD-10-CM

## 2014-05-04 ENCOUNTER — Ambulatory Visit (INDEPENDENT_AMBULATORY_CARE_PROVIDER_SITE_OTHER): Payer: 59

## 2014-05-04 ENCOUNTER — Other Ambulatory Visit (HOSPITAL_COMMUNITY): Payer: Self-pay | Admitting: Family Medicine

## 2014-05-04 DIAGNOSIS — Z87448 Personal history of other diseases of urinary system: Secondary | ICD-10-CM

## 2014-05-04 DIAGNOSIS — Z9889 Other specified postprocedural states: Secondary | ICD-10-CM

## 2014-05-04 DIAGNOSIS — R1010 Upper abdominal pain, unspecified: Secondary | ICD-10-CM

## 2014-05-04 MED ORDER — IOHEXOL 300 MG/ML  SOLN
100.0000 mL | Freq: Once | INTRAMUSCULAR | Status: AC | PRN
Start: 2014-05-04 — End: 2014-05-04
  Administered 2014-05-04: 100 mL via INTRAVENOUS

## 2014-05-05 ENCOUNTER — Ambulatory Visit (HOSPITAL_COMMUNITY): Payer: 59

## 2014-05-14 ENCOUNTER — Telehealth: Payer: Self-pay | Admitting: Cardiology

## 2014-05-14 NOTE — Telephone Encounter (Signed)
Left message to call back.   Will attempt to call again before leaving the office today.

## 2014-05-14 NOTE — Telephone Encounter (Signed)
Please let patient know that heart monitor showed NSR with several episodes of paroxysmal atrial fibrillation, PVCs and atrial tachycardia.  She has a CHADS2VASC score of 3 (female/HTN/DM).  She needs to be on anticoagulation.  Please fine out if she has had any problems with bleeding anywhere.  If not then start on Xarelto 20mg  daily.  Recent 2D echo showed normal LVF.  Her heart monitor also showed some wide complex tachycardia that is most likely afib with aberration but cannot rule out VT.  Please get a lexiscan myoview to rule out ischemia.  She will need to stop ASA since she is starting Xarelto.  Please also have her start Toprol XL 25mg  daily.  I would like to see her back in the office in 1 week to see how she is doing.  I would also like her to come in on mOnday for a BMET, TSH and CBC for her Xarelto.

## 2014-05-16 DIAGNOSIS — R001 Bradycardia, unspecified: Secondary | ICD-10-CM

## 2014-05-16 HISTORY — DX: Bradycardia, unspecified: R00.1

## 2014-05-17 ENCOUNTER — Other Ambulatory Visit: Payer: Self-pay

## 2014-05-17 ENCOUNTER — Other Ambulatory Visit (INDEPENDENT_AMBULATORY_CARE_PROVIDER_SITE_OTHER): Payer: 59 | Admitting: *Deleted

## 2014-05-17 DIAGNOSIS — R002 Palpitations: Secondary | ICD-10-CM

## 2014-05-17 DIAGNOSIS — I10 Essential (primary) hypertension: Secondary | ICD-10-CM

## 2014-05-17 DIAGNOSIS — E785 Hyperlipidemia, unspecified: Secondary | ICD-10-CM

## 2014-05-17 LAB — CBC WITH DIFFERENTIAL/PLATELET
BASOS PCT: 0.7 % (ref 0.0–3.0)
Basophils Absolute: 0 10*3/uL (ref 0.0–0.1)
EOS PCT: 2.5 % (ref 0.0–5.0)
Eosinophils Absolute: 0.2 10*3/uL (ref 0.0–0.7)
HCT: 33.7 % — ABNORMAL LOW (ref 36.0–46.0)
Hemoglobin: 11.3 g/dL — ABNORMAL LOW (ref 12.0–15.0)
LYMPHS PCT: 26.1 % (ref 12.0–46.0)
Lymphs Abs: 1.9 10*3/uL (ref 0.7–4.0)
MCHC: 33.5 g/dL (ref 30.0–36.0)
MCV: 87.3 fl (ref 78.0–100.0)
Monocytes Absolute: 0.3 10*3/uL (ref 0.1–1.0)
Monocytes Relative: 4.3 % (ref 3.0–12.0)
NEUTROS PCT: 66.4 % (ref 43.0–77.0)
Neutro Abs: 4.7 10*3/uL (ref 1.4–7.7)
Platelets: 232 10*3/uL (ref 150.0–400.0)
RBC: 3.86 Mil/uL — AB (ref 3.87–5.11)
RDW: 15.4 % (ref 11.5–15.5)
WBC: 7.1 10*3/uL (ref 4.0–10.5)

## 2014-05-17 LAB — BASIC METABOLIC PANEL
BUN: 20 mg/dL (ref 6–23)
CO2: 28 mEq/L (ref 19–32)
Calcium: 9.5 mg/dL (ref 8.4–10.5)
Chloride: 100 mEq/L (ref 96–112)
Creatinine, Ser: 1.1 mg/dL (ref 0.4–1.2)
GFR: 54.5 mL/min — ABNORMAL LOW (ref >60.00)
GLUCOSE: 91 mg/dL (ref 70–99)
POTASSIUM: 4.4 meq/L (ref 3.5–5.1)
Sodium: 137 mEq/L (ref 135–145)

## 2014-05-17 LAB — LIPID PANEL
Cholesterol: 232 mg/dL — ABNORMAL HIGH (ref 0–200)
HDL: 55.5 mg/dL (ref >39.00)
LDL CALC: 142 mg/dL — AB (ref 0–99)
NonHDL: 176.5
TRIGLYCERIDES: 172 mg/dL — AB (ref 0.0–149.0)
Total CHOL/HDL Ratio: 4
VLDL: 34.4 mg/dL (ref 0.0–40.0)

## 2014-05-17 LAB — TSH: TSH: 2.39 u[IU]/mL (ref 0.35–4.50)

## 2014-05-17 LAB — HEMOGLOBIN A1C: HEMOGLOBIN A1C: 6.8 % — AB (ref 4.6–6.5)

## 2014-05-17 NOTE — Telephone Encounter (Addendum)
Informed patient of monitor findings, increased risk for stroke, and Dr. Theodosia Blender recommendations.  Patient is to have colonoscopy tomorrow to rule out cancer and bleeding ulcer-- no new medications ordered. Patient states she had recent stress test and does not want another one unless absolutely necessary.  Lab appointment made for today for BMET, CBC, TSH, A1C. Appointment made to see Dr. Radford Pax 05/28/14.

## 2014-05-17 NOTE — Telephone Encounter (Signed)
Left message to call back  

## 2014-05-17 NOTE — Telephone Encounter (Signed)
Patient had recent ETT that was normal so no need for nuclear stress test.  Please hold on starting Xarelto until GI issues are resolved.  Please have her call us to let us know what was found with colonoscopy

## 2014-05-17 NOTE — Telephone Encounter (Signed)
Informed patient no stress test will be ordered. Informed patient no new meds will be started until GI issues are resolved. Patient confirmed she will call us and let us know what is found with colonoscopy.  Patient had labs drawn this morning and has follow-up appointment next week.

## 2014-05-17 NOTE — Telephone Encounter (Signed)
Late entry 05/14/14 1820: Attempted to call back. No answer.

## 2014-05-18 ENCOUNTER — Telehealth: Payer: Self-pay

## 2014-05-18 NOTE — Telephone Encounter (Signed)
Left message on patient's private VM to call back tomorrow. Informed patient in message that labs are being sent to PCP for review and to call back tomorrow to verify Crestor dosages.

## 2014-05-18 NOTE — Telephone Encounter (Signed)
-----   Message from Sueanne Margarita, MD sent at 05/17/2014  3:58 PM EST ----- Please find out what dose of crestor patient is on.  Also forward labs to PCP to treat elevated Hbg A1C and anemia

## 2014-05-19 NOTE — Telephone Encounter (Signed)
Pt st she has not taken Crestor for 2 months -- she ran out and then her insurance denied coverage. Her PCP started her on 20 mg atorvastatin today.  Pt st her colonoscopy biopsies will not result for 7-10 days, but that her doctor says he thinks she has ulcerative colitis. She started 1.2 g Lialda qid today for treatment.   Patient asking about Xarelto and whether or not she should start for her A-fib seen on event monitor  Routing to Dr. Radford Pax for review and recommendations.

## 2014-05-19 NOTE — Telephone Encounter (Signed)
Follow up ° ° ° ° ° °Returning Katy's call °

## 2014-05-20 NOTE — Telephone Encounter (Signed)
Left message on pt's VM to call back and leave message with name of her GI MD.

## 2014-05-20 NOTE — Telephone Encounter (Signed)
Follow Up       Pt returning phone call from [facility]. Pt doesn't wish to leave name of GI MD in message and wants to speak to [facility]. Please call back and advise.

## 2014-05-20 NOTE — Telephone Encounter (Signed)
GI office closed.  Routing to triage to call Dr. Kathline Magic office at [phone] tomorrow for Xarelto clearance.

## 2014-05-20 NOTE — Telephone Encounter (Signed)
Patient st that her GI doctor is [facility]. Pt st that she is going to her PCP today -- she has blood in her urine.  Instructed patient to F/U with [place] after her PCP appointment.

## 2014-05-20 NOTE — Telephone Encounter (Signed)
Could you please let me know if patient is ok to start Xarelto after having colonoscopy with biopsy?

## 2014-05-20 NOTE — Telephone Encounter (Signed)
Please find out who her GI MD is and call them to see if and when it is safe to start Xarelto

## 2014-05-21 ENCOUNTER — Encounter: Payer: Self-pay | Admitting: Nurse Practitioner

## 2014-05-21 NOTE — Telephone Encounter (Signed)
Called Dr. Kathline Magic office & will need to call back later regarding the Xarelto. Contact person Dr. Kathline Magic assist: Ann Maki RN

## 2014-05-24 NOTE — Telephone Encounter (Signed)
Please call her Urologist office and find out if he is ok with patient going on Xarelto.  If ok then ,per GI,  have  patient start Xarelto on 06/04/2014.

## 2014-05-24 NOTE — Telephone Encounter (Signed)
Please check with Dr. Maceo Pro to make sure patient ok to take Xarelto

## 2014-05-24 NOTE — Telephone Encounter (Signed)
I spoke with Melissa at Dr. Kathline Magic office. Per Melissa, the patient had multiple small biopsy's done around the colon due to diarrhea. Dr. Michail Sermon felt like she may have Chron's disease or IBS. Biopsy results are pending. Per Melissa, when multiple biopsy's are done, it is safest to wait 2 weeks post procedure prior to restarting blood thinners if possible. The patient had her biopsies on 11/3 ( 2 weeks post = 11/17). Will forward to Dr. Radford Pax and Valetta Fuller to review.

## 2014-05-24 NOTE — Telephone Encounter (Signed)
Spoke with pt. She reports urologist was Dr. Pia Mau at [facility] in [place]. Pt reports she had many complications post surgery and is no longer seeing him.  Did see Dr. Vito Berger in Urology department at [facility] but was told she did not need to follow up with him.  Pt reports she saw Dr. Maceo Pro (primary care) today and lab tests showed no blood in her urine.  Pt reports no visable blood in her urine.

## 2014-05-24 NOTE — Telephone Encounter (Signed)
Spoke with Lattie Haw at Dr Delman Kitten office, [phone]. She is going to send a message to Dr Maceo Pro to get confirmation from him that it is OK for pt to take Xarelto.  We should expect call back today or tomorrow.

## 2014-05-26 ENCOUNTER — Telehealth: Payer: Self-pay

## 2014-05-26 NOTE — Telephone Encounter (Signed)
agree

## 2014-05-26 NOTE — Telephone Encounter (Signed)
Dr. Maceo Pro called and spoke with Dr. Radford Pax.  Per both doctors, patient is cleared to start Xarelto 20 mg daily starting June 04, 2014.   Will order and notify patient of new prescription.

## 2014-05-28 ENCOUNTER — Encounter: Payer: Self-pay | Admitting: Cardiology

## 2014-05-28 ENCOUNTER — Ambulatory Visit (INDEPENDENT_AMBULATORY_CARE_PROVIDER_SITE_OTHER): Payer: 59 | Admitting: Cardiology

## 2014-05-28 VITALS — BP 90/50 | HR 86 | Ht 65.0 in | Wt 195.2 lb

## 2014-05-28 DIAGNOSIS — I480 Paroxysmal atrial fibrillation: Secondary | ICD-10-CM

## 2014-05-28 DIAGNOSIS — R55 Syncope and collapse: Secondary | ICD-10-CM

## 2014-05-28 DIAGNOSIS — E785 Hyperlipidemia, unspecified: Secondary | ICD-10-CM

## 2014-05-28 DIAGNOSIS — I10 Essential (primary) hypertension: Secondary | ICD-10-CM

## 2014-05-28 MED ORDER — RAMIPRIL 2.5 MG PO CAPS: 2.5000 mg | ORAL_CAPSULE | Freq: Every day | ORAL | Status: DC

## 2014-05-28 MED ORDER — RIVAROXABAN 20 MG PO TABS: 20.0000 mg | ORAL_TABLET | Freq: Every day | ORAL | Status: DC

## 2014-05-28 NOTE — Patient Instructions (Addendum)
Your physician has recommended you make the following change in your medication: 1) START Xarelto 20 mg daily on November 20 2) STOP your Aspirin on November 19 3) STOP HCTZ 4) DECREASE Altace to 2.5 mg daily   Your physician recommends that you schedule a follow-up appointment in: 2 WEEKS with Richardson Dopp.  Your physician recommends that you schedule a follow-up appointment in: 6 weeks with Dr. Radford Pax.

## 2014-05-28 NOTE — Progress Notes (Signed)
[place], [place], [place]  [address] Phone: [phone] Fax:  [phone]  Date:  05/28/2014   ID:  Belinda Schlichting, DOB May 01, 1966, MRN [id number]  PCP:  Abigail Miyamoto, MD  Cardiologist:  Fransico Him, MD    History of Present Illness: This is a 48yo female with a history of HTN, DM and remote history of chest pain with negative workup with stress test and echo. She now presents back for followup. She has had pancreatitis since I saw her last and was diagnosed with a kidney mass and underwent a large resection of a benign mass complicated by multiple infections. This past July, she had an episode of severe abdominal pain with severe diarrhea and then became very diaphoretic with the pain and then got dizzy and passed out. Apparently she has chronic issues with abdominal pain and diarrhea. A CT scan was done to evaluate and she was also noted at that time to have calcifications of her abdominal aorta but no aneurysm. She was concerned about the aorta and decided to followup with me. She recently had a colonoscopy to workup her diarrhea and biopsies were taken and she was felt to have colitis.  She was having palpitations and a heart monitor showed PAF with RVR.  She also was having CP when I saw her and an ETT showed no ischemia.  2D echo showed normal LVF.  It was recommended that she start Xarelto for elevated CHADSVASC score and GI has recommended not starting this until 11/17 since she had colonic biopsies.  She denies any chest pain but has had some DOE.  She stopped exercising about 5 weeks ago and wants to start back slowly at the gym.   Wt Readings from Last 3 Encounters:  05/28/14 195 lb 3.2 oz (88.542 kg)  04/01/14 201 lb (91.173 kg)  11/10/12 232 lb (105.235 kg)     Past Medical History  Diagnosis Date  . Atrophic vaginitis 01/2006  . Diabetes mellitus without complication 12/9627    on Ace inhibitor to protect kidneys  . Cytomegaloviral hepatitis 2005  .  Hyperlipidemia   . Hypertension   . OSA (obstructive sleep apnea)   . RA (rheumatoid arthritis)   . GERD (gastroesophageal reflux disease)   . Syncope and collapse   . Obesity   . Postmenopausal     Current Outpatient Prescriptions  Medication Sig Dispense Refill  . aspirin 325 MG EC tablet Take 325 mg by mouth daily.    Marland Kitchen atorvastatin (LIPITOR) 20 MG tablet   5  . EPINEPHrine (EPIPEN) 0.3 mg/0.3 mL IJ SOAJ injection Inject 0.3 mLs (0.3 mg total) into the muscle as needed. 1 Device 0  . exenatide (BYETTA) 5 MCG/0.02ML SOPN injection Inject 10 mcg into the skin 2 (two) times daily with a meal.     . glipiZIDE (GLUCOTROL) 5 MG tablet Take 5 mg by mouth 2 (two) times daily before a meal.    . hydrochlorothiazide (MICROZIDE) 12.5 MG capsule Take 12.5 mg by mouth daily.     Marland Kitchen HYDROcodone-acetaminophen (NORCO/VICODIN) 5-325 MG per tablet Take 1 tablet by mouth every 6 (six) hours as needed for moderate pain.    . hydrOXYzine (ATARAX/VISTARIL) 25 MG tablet Take 25 mg by mouth every 6 (six) hours. For Allergic reactions    . mesalamine (LIALDA) 1.2 G EC tablet Take by mouth 4 (four) times daily.    . metaxalone (SKELAXIN) 800 MG tablet Take 800 mg by mouth as needed  for muscle spasms.    . Multiple Vitamins-Minerals (MULTIVITAMIN PO) Take 1 tablet by mouth daily.     Marland Kitchen omeprazole (PRILOSEC) 40 MG capsule Take 40 mg by mouth daily.     . ondansetron (ZOFRAN ODT) 8 MG disintegrating tablet 8mg  ODT q4 hours prn nausea 20 tablet 0  . ONETOUCH DELICA LANCETS 54S MISC by Does not apply route.    . ramipril (ALTACE) 5 MG tablet Take 5 mg by mouth daily.    . Rosuvastatin Calcium (CRESTOR PO) Take 1 tablet by mouth daily.     No current facility-administered medications for this visit.    Allergies:    Allergies  Allergen Reactions  . Ibuprofen Nausea Only    Coating on tablet  . Orange Fruit [Citrus] Anaphylaxis  . Bactrim [Sulfamethoxazole-Trimethoprim] Rash  . Tape Rash    Social  History:  The patient  reports that she has never smoked. She does not have any smokeless tobacco history on file. She reports that she drinks alcohol. She reports that she does not use illicit drugs.   Family History:  The patient's family history includes Cancer in her maternal grandmother and paternal aunt; Diabetes in her father, maternal grandmother, mother, and paternal aunt; Fibromyalgia in her mother; Heart disease in her father; Hypertension in her father, mother, and sister.   ROS:  Please see the history of present illness.      All other systems reviewed and negative.   PHYSICAL EXAM: VS:  BP 90/50 mmHg  Pulse 86  Ht 5\' 5"  (1.651 m)  Wt 195 lb 3.2 oz (88.542 kg)  BMI 32.48 kg/m2  SpO2 92%  LMP 07/16/2000 Well nourished, well developed, in no acute distress HEENT: normal Neck: no JVD Cardiac:  normal S1, S2; RRR; no murmur Lungs:  clear to auscultation bilaterally, no wheezing, rhonchi or rales Abd: soft, nontender, no hepatomegaly Ext: no edema Skin: warm and dry Neuro:  CNs 2-12 intact, no focal abnormalities noted     ASSESSMENT AND PLAN: 1. Aortic calcifications with no aneurysm seen on abdominal CT- continue ASA until she starts Xarelto and then she has been instructed to stop ASA 2. Palpitations with PAF noted on heart monitor.  Her CHADS2VASC score is 3 (female/DM).  She is going to start on the Xarelto on 06/04/2014 3. Chest pain - atypical and is most likely related to panic attack - no ischemia on ETT and normal LVF on 2D echo to assess LVF 4. Syncope which sounds vasovagal triggered by abdominal pain - she has had several episodes like this in the past related to pain.She has been seeing GI 5. Renal mass s/p resection with multiple complications and is followed by urology 6. DM without complication 7. HTN with soft BP today- I have recommended that we stop the HCTZ and decrease Altace to 2.5mg  daily.   8. Dyslipidemia - Her LDL was elevated at last check  but she had been off crestor for 3 months and Dr. Maceo Pro just put her on atorvastatin - recheck FLP and ALT in 6 weeks  Followup with PA in 2 weeks and if BP improved consider starting low dose BB to suppress PAF.  Followup with me in 6 weeks   Signed, Fransico Him, MD [facility] 05/28/2014 3:51 PM

## 2014-05-29 ENCOUNTER — Telehealth: Payer: Self-pay | Admitting: Physician Assistant

## 2014-05-29 NOTE — Telephone Encounter (Signed)
Courtney Olson is a 48 y.o. female with PAF.  Patient recently dx with UC. She has significant amounts of diarrhea.  She called in today with BPs as low as 60/45 with symptoms of near syncope.  She increased salt and fluids today.  Now BP 100/70. No BRBPR, fever, cough, chest pain or dyspnea. I have asked her to push fluids and salt today. Hold Altace. She has been advised to go to the urgent care or ED if BP remains < 90 systolic or she remains symptomatic. She agrees with this plan. Signed,  Richardson Dopp, PA-C   05/29/2014 3:22 PM

## 2014-06-09 ENCOUNTER — Telehealth: Payer: Self-pay | Admitting: Cardiology

## 2014-06-09 NOTE — Telephone Encounter (Signed)
Given small amount of blood and that it has resolved at this time, would not recommend holding Xarelto.  Would continue to monitor.

## 2014-06-09 NOTE — Telephone Encounter (Signed)
New problem   Pt need to know if she need to keep taking her Xarelto b/c of minor bleeding. Please call pt.

## 2014-06-09 NOTE — Telephone Encounter (Signed)
Patient started taking Xarelto this Sunday. Earlier this month, she had a colonoscopy where polyps were removed and she was diagnosed with ulcerative colitis.  Yesterday, she has a bowel movement and when she wiped, a small amount of frank blood was on the toilet paper and a little was seen in the toilet. Last night, she noticed if she pushed, a little tiny drop of blood came out. After driving to [facility] last night, when she got out of the car a little blood clot (about half the size of an eraser) came out of her anus.   Pt st she has recently started many new medications, including atorvastatin, amoxicillin (has 2 days left), a new DM drug, lialda, and xarelto.   She called her GI doc yesterday and they said they thought it might be the Xarelto, but for the patient to call us to be sure if she should hold the drug.   To Gay Filler for recommendations.

## 2014-06-09 NOTE — Telephone Encounter (Signed)
Follow Up        Pt calling stating there hasn't been any blood all night nor today.

## 2014-06-09 NOTE — Telephone Encounter (Signed)
Instructed patient to continue Xarelto but to monitor bleeding.  Patient st she has been in the car all day and has not used to bathroom so she does not know if it is still an issue.  Pt st she will call if bleeding is recurrent.

## 2014-06-14 ENCOUNTER — Ambulatory Visit (INDEPENDENT_AMBULATORY_CARE_PROVIDER_SITE_OTHER): Payer: 59 | Admitting: Physician Assistant

## 2014-06-14 ENCOUNTER — Encounter: Payer: Self-pay | Admitting: Physician Assistant

## 2014-06-14 VITALS — BP 130/70 | HR 68 | Ht 65.0 in | Wt 191.0 lb

## 2014-06-14 DIAGNOSIS — I700 Atherosclerosis of aorta: Secondary | ICD-10-CM | POA: Insufficient documentation

## 2014-06-14 DIAGNOSIS — R079 Chest pain, unspecified: Secondary | ICD-10-CM

## 2014-06-14 DIAGNOSIS — E119 Type 2 diabetes mellitus without complications: Secondary | ICD-10-CM

## 2014-06-14 DIAGNOSIS — E785 Hyperlipidemia, unspecified: Secondary | ICD-10-CM

## 2014-06-14 DIAGNOSIS — N2889 Other specified disorders of kidney and ureter: Secondary | ICD-10-CM

## 2014-06-14 DIAGNOSIS — R55 Syncope and collapse: Secondary | ICD-10-CM

## 2014-06-14 DIAGNOSIS — I480 Paroxysmal atrial fibrillation: Secondary | ICD-10-CM

## 2014-06-14 DIAGNOSIS — I10 Essential (primary) hypertension: Secondary | ICD-10-CM

## 2014-06-14 MED ORDER — METOPROLOL SUCCINATE ER 25 MG PO TB24: 12.5000 mg | ORAL_TABLET | Freq: Every day | ORAL | Status: DC

## 2014-06-14 MED ORDER — METOPROLOL SUCCINATE ER 25 MG PO TB24: 12.5000 mg | ORAL_TABLET | Freq: Every evening | ORAL | Status: DC

## 2014-06-14 MED ORDER — RIVAROXABAN 20 MG PO TABS: 20.0000 mg | ORAL_TABLET | Freq: Every day | ORAL | Status: DC

## 2014-06-14 NOTE — Patient Instructions (Signed)
START TOPROL XL 25 MG TABLET; YOU WILL BE TAKING 1/2 TAB EVERY EVENING TO = 12.5 MG   A REFILL FOR XARELTO HAS BEEN SENT IN  KEEP YOUR FOLLOW UP WITH DR. Radford Pax 07/13/14 AS PLANNED

## 2014-06-14 NOTE — Progress Notes (Signed)
Cardiology Office Note   Date:  06/14/2014   ID:  Courtney Olson, DOB 1965/08/02, MRN [id number]  PCP:  Abigail Miyamoto, MD  Cardiologist:  Dr. Fransico Him     History of Present Illness: Courtney Olson is a 48 y.o. female with a history of HTN, DM and remote history of chest pain with negative workup with stress test and echo. She has had pancreatitis and was diagnosed with a kidney mass and underwent a large resection of a benign mass complicated by multiple infections. This past July, she had an episode of severe abdominal pain with severe diarrhea and then became very diaphoretic with the pain and then got dizzy and passed out. Apparently she has chronic issues with abdominal pain and diarrhea. A CT scan was done to evaluate and she was also noted at that time to have calcifications of her abdominal aorta but no aneurysm. She was concerned about the aorta and decided to followup with Dr. Radford Pax. She recently had a colonoscopy to workup her diarrhea and biopsies were taken and she was felt to have ulcerative colitis. She was having palpitations and a heart monitor showed PAF with RVR. She also was having CP and an ETT showed no ischemia. 2D echo showed normal LVF. It was recommended that she start Xarelto for elevated CHADSVASC score (3 - HTN, DM, female) and GI has recommended not starting this until 11/17 since she had colonic biopsies.    Last seen by Dr. Fransico Him 05/28/14.  BP was running low at that time and her medications were adjusted.  She is brought back today with an eye towards starting low dose BB to suppress PAF if her BP will allow.  Since last seen, she is doing well.  Her diarrhea is much improved.  She notes that her BP is better.  She denies chest pain.  She has not been exercising as much as she has in the past.  She notes DOE with more moderate to extreme activities.  She denies orthopnea, PND, edema.  She denies syncope.  She continues to note palpitations, mainly at  night.   Studies:  - Echo (9/15):  Image quality was poor. EF 55% to 60%. Wall motion was normal; Grade 1 diastolic dysfunction  - Nuclear (07/2012):  No ischemia, EF 82%; Normal   - ETT (10/15):  Negative    Recent Labs: 01/20/2014: ALT 31 05/17/2014: BUN 20; Creatinine 1.1; Hemoglobin 11.3*; LDL (calc) 142*; Potassium 4.4; Sodium 137; TSH 2.39    Recent Radiology: No results found.    Wt Readings from Last 3 Encounters:  06/14/14 191 lb (86.637 kg)  05/28/14 195 lb 3.2 oz (88.542 kg)  04/01/14 201 lb (91.173 kg)     Past Medical History  Diagnosis Date  . Atrophic vaginitis 01/2006  . Diabetes mellitus without complication 10/979    on Ace inhibitor to protect kidneys  . Cytomegaloviral hepatitis 2005  . Hyperlipidemia   . Hypertension   . OSA (obstructive sleep apnea)   . RA (rheumatoid arthritis)   . GERD (gastroesophageal reflux disease)   . Syncope and collapse   . Obesity   . Postmenopausal   . PAF (paroxysmal atrial fibrillation)     Current Outpatient Prescriptions  Medication Sig Dispense Refill  . atorvastatin (LIPITOR) 20 MG tablet Take 20 mg by mouth daily at 6 PM.   5  . EPINEPHrine (EPIPEN) 0.3 mg/0.3 mL IJ SOAJ injection Inject 0.3 mLs (0.3 mg total) into the muscle as  needed. 1 Device 0  . Exenatide (BYETTA 10 MCG PEN [place]ki) Inject 10 mcg into the skin 2 (two) times daily.    Marland Kitchen glipiZIDE (GLUCOTROL) 5 MG tablet Take 5 mg by mouth 2 (two) times daily before a meal.    . HYDROcodone-acetaminophen (NORCO/VICODIN) 5-325 MG per tablet Take 1 tablet by mouth every 6 (six) hours as needed for moderate pain.    . hydrOXYzine (ATARAX/VISTARIL) 25 MG tablet Take 25 mg by mouth every 6 (six) hours. For Allergic reactions    . JARDIANCE 10 MG TABS tablet Take 10 mg by mouth daily.     . mesalamine (LIALDA) 1.2 G EC tablet Take 1.2 g by mouth daily. TAKE 4  TABS DAILY    . metaxalone (SKELAXIN) 800 MG tablet Take 800 mg by mouth as needed for muscle spasms.    .  Multiple Vitamins-Minerals (MULTIVITAMIN PO) Take 1 tablet by mouth daily.     Marland Kitchen omeprazole (PRILOSEC) 40 MG capsule Take 40 mg by mouth daily.     . ondansetron (ZOFRAN ODT) 8 MG disintegrating tablet 8mg  ODT q4 hours prn nausea 20 tablet 0  . ONETOUCH DELICA LANCETS 37J MISC by Does not apply route.    . ramipril (ALTACE) 2.5 MG capsule Take 1 capsule (2.5 mg total) by mouth daily. 90 capsule 3  . rivaroxaban (XARELTO) 20 MG TABS tablet Take 1 tablet (20 mg total) by mouth daily. 30 tablet 11   No current facility-administered medications for this visit.     Allergies:   Ibuprofen; Orange fruit; Bactrim; and Tape   Social History:  The patient  reports that she has never smoked. She does not have any smokeless tobacco history on file. She reports that she drinks alcohol. She reports that she does not use illicit drugs.   Family History:  The patient's family history includes Cancer in her maternal grandmother and paternal aunt; Diabetes in her father, maternal grandmother, mother, and paternal aunt; Fibromyalgia in her mother; Heart disease in her father; Hypertension in her father, mother, and sister.   ROS:  Please see the history of present illness.   She had some blood on the tissue with a bowel movement 2 days after starting Xarelto.  She denies any further bleeding since.   All other systems reviewed and negative.    PHYSICAL EXAM: VS:  BP 130/70 mmHg  Pulse 68  Ht 5\' 5"  (1.651 m)  Wt 191 lb (86.637 kg)  BMI 31.78 kg/m2  LMP 07/16/2000 Well nourished, well developed, in no acute distress HEENT: normal Neck: no JVD Cardiac:  normal S1, S2;  RRR; no murmur   Lungs:   clear to auscultation bilaterally, no wheezing, rhonchi or rales Abd: soft, nontender, no hepatomegaly Ext: no edema Skin: warm and dry Neuro:  CNs 2-12 intact, no focal abnormalities noted  EKG:  NSR, HR 68, normal axis, no ST changes, incomplete RBBB, no change from prior tracing      ASSESSMENT AND  PLAN:  1.  PAF (paroxysmal atrial fibrillation):  Maintaining NSR.  She is tolerating Xarelto.  She continues to have palpitations.  Her BP now looks like it will tolerate a low dose of beta blocker.      -  Start Toprol-XL 12.5 mg QD. 2.  Essential hypertension:  Controlled.  3.  Vasovagal syncope:  No recurrence. 4.  Hyperlipidemia:  Continue statin.   5.  Aortic calcification on Abdominal CT without aneurysm:  She is no longer on ASA  as she is on Xarelto. 6.  Chest pain - atypical:  Recent ETT negative.  She has not had a recurrence.  7.  Renal mass - s/p resection:  Followed by urology. 8.  Diabetes mellitus without complication:  FU with PCP.  05/17/2014: Hemoglobin-A1c 6.8*   Disposition:   FU with Dr. Fransico Him next month as planned.    Signed, Versie Starks, MHS 06/14/2014 10:15 AM    [facility] [address], [place], [place]  [address] Phone: [phone]; Fax: [phone]

## 2014-07-12 ENCOUNTER — Ambulatory Visit (INDEPENDENT_AMBULATORY_CARE_PROVIDER_SITE_OTHER): Payer: 59 | Admitting: Nurse Practitioner

## 2014-07-12 ENCOUNTER — Encounter: Payer: Self-pay | Admitting: Nurse Practitioner

## 2014-07-12 VITALS — BP 110/80 | HR 64 | Resp 18 | Ht 65.0 in | Wt 188.0 lb

## 2014-07-12 DIAGNOSIS — Z01419 Encounter for gynecological examination (general) (routine) without abnormal findings: Secondary | ICD-10-CM

## 2014-07-12 DIAGNOSIS — Z0000 Encounter for general adult medical examination without abnormal findings: Secondary | ICD-10-CM

## 2014-07-12 LAB — POCT URINALYSIS DIPSTICK
Bilirubin, UA: NEGATIVE
Glucose, UA: 1000
Ketones, UA: NEGATIVE
Leukocytes, UA: NEGATIVE
Nitrite, UA: NEGATIVE
PROTEIN UA: NEGATIVE
RBC UA: NEGATIVE
UROBILINOGEN UA: NEGATIVE
pH, UA: 5

## 2014-07-12 NOTE — Patient Instructions (Signed)

## 2014-07-12 NOTE — Progress Notes (Signed)
48 y.o. G3P3 Married Caucasian Fe here for annual exam.  Having an increase in vaginal dryness.  Several medical issues past 2 years.  She had GB surgery and diagnosed with ulcerative colitis.  She then had a right renal cyst that was removed with other complications.  Then diagnosed with A Fib and is on Xarelto.  Her diabetes is under control.  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    Patient's last menstrual period was 07/16/2000.          Sexually active: Yes.    The current method of family planning is status post hysterectomy.    Exercising: Yes.    Cardio, Machines at [employer] Smoker:  no  Health Maintenance: Pap: 2003 Normal  MMG:  08/11/13 BIRADS1:neg Colonoscopy: 2015 Colitis  BMD:   10/2006 TDaP: PCP Labs: Cardiology 05/2014  UA: Glucose=1000, Ph=5.0   reports that she has never smoked. She has never used smokeless tobacco. She reports that she drinks alcohol. She reports that she does not use illicit drugs.  Past Medical History  Diagnosis Date  . Atrophic vaginitis 01/2006  . Diabetes mellitus without complication 01/622    on Ace inhibitor to protect kidneys  . Cytomegaloviral hepatitis 2005  . Hyperlipidemia   . Hypertension   . OSA (obstructive sleep apnea)   . RA (rheumatoid arthritis)   . GERD (gastroesophageal reflux disease)   . Syncope and collapse   . Obesity   . Postmenopausal   . PAF (paroxysmal atrial fibrillation)   . Bradycardia 05/2014  . Pancreatitis 2014  . Kidney tumor 03/2013    Right  . Gallstones 01/2013  . Ulcerative colitis 2015  . Chronic pancolonic ulcerative colitis 2015    unknown if chronic.    Past Surgical History  Procedure Laterality Date  . Dilation and curettage of uterus  2002  . Cesarean section    . Total abdominal hysterectomy w/ bilateral salpingoophorectomy  2002    Secondary to AUB  . Cholecystectomy  01/2013  . Hernia repair  01/2013  . Partial nephrectomy Right 02/2013    Tumor Removal   .  Kidney and bladder stent and tube placed Right 03/2013  . Procedure to remove kidney and bladder stent and tube Right 04/2013  . Colonoscopy  2015    Colitis    Current Outpatient Prescriptions  Medication Sig Dispense Refill  . atorvastatin (LIPITOR) 20 MG tablet Take 20 mg by mouth daily at 6 PM.   5  . exenatide (BYETTA) 10 MCG/0.04ML SOPN injection Inject 10 mcg into the skin 2 (two) times daily with a meal.    . glipiZIDE (GLUCOTROL) 5 MG tablet Take 5 mg by mouth 2 (two) times daily before a meal.    . JARDIANCE 25 MG TABS tablet 25 mg daily.     . mesalamine (LIALDA) 1.2 G EC tablet Take 1.2 g by mouth daily. TAKE 4  TABS DAILY    . metoprolol succinate (TOPROL-XL) 25 MG 24 hr tablet Take 0.5 tablets (12.5 mg total) by mouth every evening. 90 tablet 3  . Multiple Vitamins-Minerals (MULTIVITAMIN PO) Take 1 tablet by mouth daily.     Marland Kitchen omeprazole (PRILOSEC) 40 MG capsule Take 40 mg by mouth daily.     Glory Rosebush DELICA LANCETS 76E MISC by Does not apply route.    . ramipril (ALTACE) 2.5 MG capsule Take 1 capsule (2.5 mg total) by mouth daily. 90 capsule 3  . rivaroxaban (XARELTO)  20 MG TABS tablet Take 1 tablet (20 mg total) by mouth daily. 90 tablet 3  . EPINEPHrine (EPIPEN) 0.3 mg/0.3 mL IJ SOAJ injection Inject 0.3 mLs (0.3 mg total) into the muscle as needed. (Patient not taking: Reported on 07/12/2014) 1 Device 0  . HYDROcodone-acetaminophen (NORCO/VICODIN) 5-325 MG per tablet Take 1 tablet by mouth every 6 (six) hours as needed for moderate pain.    . hydrOXYzine (ATARAX/VISTARIL) 25 MG tablet Take 25 mg by mouth every 6 (six) hours as needed. For Allergic reactions    . metaxalone (SKELAXIN) 800 MG tablet Take 800 mg by mouth as needed for muscle spasms.    . ondansetron (ZOFRAN ODT) 8 MG disintegrating tablet 8mg  ODT q4 hours prn nausea (Patient not taking: Reported on 07/12/2014) 20 tablet 0   No current facility-administered medications for this visit.    Family History   Problem Relation Age of Onset  . Diabetes Mother   . Hypertension Mother   . Fibromyalgia Mother   . Hypertension Father   . Heart disease Father     CABG  . Diabetes Father   . Hypertension Sister   . Diabetes Paternal Aunt   . Cancer Paternal Aunt     bone  . Cancer Maternal Grandmother     ovarian  . Diabetes Maternal Grandmother     ROS:  Pertinent items are noted in HPI.  Otherwise, a comprehensive ROS was negative.  Exam:   BP 110/80 mmHg  Pulse 64  Resp 18  Ht 5\' 5"  (1.651 m)  Wt 188 lb (85.276 kg)  BMI 31.28 kg/m2  LMP 07/16/2000 Height: 5\' 5"  (165.1 cm)  Ht Readings from Last 3 Encounters:  07/12/14 5\' 5"  (1.651 m)  06/14/14 5\' 5"  (1.651 m)  05/28/14 5\' 5"  (1.651 m)    General appearance: alert, cooperative and appears stated age Head: Normocephalic, without obvious abnormality, atraumatic Neck: no adenopathy, supple, symmetrical, trachea midline and thyroid normal to inspection and palpation Lungs: clear to auscultation bilaterally Breasts: normal appearance, no masses or tenderness Heart: regular rate and rhythm Abdomen: soft, non-tender; no masses,  no organomegaly Extremities: extremities normal, atraumatic, no cyanosis or edema Skin: Skin color, texture, turgor normal. No rashes or lesions Lymph nodes: Cervical, supraclavicular, and axillary nodes normal. No abnormal inguinal nodes palpated Neurologic: Grossly normal   Pelvic: External genitalia:  no lesions              Urethra:  normal appearing urethra with no masses, tenderness or lesions              Bartholin's and Skene's: normal                 Vagina: normal appearing vagina with normal color and discharge, no lesions except for small vaginal wall cyst on the right mid vagina              Cervix: absent              Pap taken: Yes.   Bimanual Exam:  Uterus:  uterus absent              Adnexa: no mass, fullness, tenderness               Rectovaginal: Confirms               Anus:  normal  sphincter tone, no lesions  A:  Well Woman with normal exam  S/P TAH/BSO secondary to AUB 2002  History of  DM, HTN, idiopathic edema  history of weight loss from 287 lb. to 188 lbs.  S/P Lap Chole with pancreatitis  S/P Right Partial Nephrectomy 03/11/13 for benign mass with post op complications requiring nephrostomy tubes and stents.  New diagnoses of A Fib - on Xeralto    P:   Reviewed health and wellness pertinent to exam  Pap smear taken today  Mammogram is due 1/16  Counseled on breast self exam, mammography screening, adequate intake of calcium and vitamin D, diet and exercise return annually or prn  An After Visit Summary was printed and given to the patient.

## 2014-07-13 ENCOUNTER — Ambulatory Visit (INDEPENDENT_AMBULATORY_CARE_PROVIDER_SITE_OTHER): Payer: 59 | Admitting: Cardiology

## 2014-07-13 ENCOUNTER — Encounter: Payer: Self-pay | Admitting: Cardiology

## 2014-07-13 DIAGNOSIS — I10 Essential (primary) hypertension: Secondary | ICD-10-CM

## 2014-07-13 DIAGNOSIS — G4733 Obstructive sleep apnea (adult) (pediatric): Secondary | ICD-10-CM

## 2014-07-13 DIAGNOSIS — I480 Paroxysmal atrial fibrillation: Secondary | ICD-10-CM

## 2014-07-13 NOTE — Patient Instructions (Signed)
Your physician recommends that you continue on your current medications as directed. Please refer to the Current Medication list given to you today.  Your physician wants you to follow-up in: 6 months with Dr Turner You will receive a reminder letter in the mail two months in advance. If you don't receive a letter, please call our office to schedule the follow-up appointment.  

## 2014-07-13 NOTE — Progress Notes (Signed)
[place], [place], [place]  [address] Phone: [phone] Fax:  [phone]  Date:  07/13/2014   ID:  Courtney Olson, DOB 1965/10/08, MRN [id number]  PCP:  Abigail Miyamoto, MD  Cardiologist:  Fransico Him, MD    History of Present Illness: This is a 48yo female with a history of HTN, DM and remote history of chest pain with negative workup with stress test and echo. She now presents back for followup. She has had pancreatitis since I saw her last and was diagnosed with a kidney mass and underwent a large resection of a benign mass complicated by multiple infections. This past July, she had an episode of severe abdominal pain with severe diarrhea and then became very diaphoretic with the pain and then got dizzy and passed out. Apparently she has chronic issues with abdominal pain and diarrhea. A CT scan was done to evaluate and she was also noted at that time to have calcifications of her abdominal aorta but no aneurysm. She was concerned about the aorta and decided to followup with me. She recently had a colonoscopy to workup her diarrhea and biopsies were taken and she was felt to have colitis. She was having palpitations and a heart monitor showed PAF with RVR. She also was having CP when I saw her and an ETT showed no ischemia. 2D echo showed normal LVF. It was recommended that she start Xarelto for elevated CHADSVASC score and GI has recommended not starting this until 11/17 since she had colonic biopsies. She denies any chest pain or SOB. Her palpitations have significantly improved on BB.  She denies any LE edema, dizziness or syncope.  Wt Readings from Last 3 Encounters:  07/13/14 189 lb 12.8 oz (86.093 kg)  07/12/14 188 lb (85.276 kg)  06/14/14 191 lb (86.637 kg)     Past Medical History  Diagnosis Date  . Atrophic vaginitis 01/2006  . Diabetes mellitus without complication 12/6061    on Ace inhibitor to protect kidneys  . Cytomegaloviral hepatitis 2005  .  Hyperlipidemia   . Hypertension   . OSA (obstructive sleep apnea)   . RA (rheumatoid arthritis)   . GERD (gastroesophageal reflux disease)   . Syncope and collapse   . Obesity   . Postmenopausal   . PAF (paroxysmal atrial fibrillation)   . Bradycardia 05/2014  . Pancreatitis 2014  . Kidney tumor 03/2013    Right  . Gallstones 01/2013  . Ulcerative colitis 2015  . Chronic pancolonic ulcerative colitis 2015    unknown if chronic.    Current Outpatient Prescriptions  Medication Sig Dispense Refill  . atorvastatin (LIPITOR) 20 MG tablet Take 20 mg by mouth daily at 6 PM.   5  . EPINEPHrine (EPIPEN) 0.3 mg/0.3 mL IJ SOAJ injection Inject 0.3 mLs (0.3 mg total) into the muscle as needed. 1 Device 0  . exenatide (BYETTA) 10 MCG/0.04ML SOPN injection Inject 10 mcg into the skin 2 (two) times daily with a meal.    . glipiZIDE (GLUCOTROL) 5 MG tablet Take 5 mg by mouth 2 (two) times daily before a meal.    . hydrOXYzine (ATARAX/VISTARIL) 25 MG tablet Take 25 mg by mouth every 6 (six) hours as needed. For Allergic reactions    . JARDIANCE 25 MG TABS tablet 25 mg daily.     . mesalamine (LIALDA) 1.2 G EC tablet Take 1.2 g by mouth daily. TAKE 4  TABS DAILY    . metoprolol succinate (TOPROL-XL) 25  MG 24 hr tablet Take 0.5 tablets (12.5 mg total) by mouth every evening. 90 tablet 3  . Multiple Vitamins-Minerals (MULTIVITAMIN PO) Take 1 tablet by mouth daily.     Marland Kitchen omeprazole (PRILOSEC) 40 MG capsule Take 40 mg by mouth daily.     . ondansetron (ZOFRAN ODT) 8 MG disintegrating tablet 8mg  ODT q4 hours prn nausea 20 tablet 0  . ONETOUCH DELICA LANCETS 65L MISC by Does not apply route.    . ramipril (ALTACE) 2.5 MG capsule Take 1 capsule (2.5 mg total) by mouth daily. 90 capsule 3  . rivaroxaban (XARELTO) 20 MG TABS tablet Take 1 tablet (20 mg total) by mouth daily. 90 tablet 3   No current facility-administered medications for this visit.    Allergies:    Allergies  Allergen Reactions  .  Ibuprofen Nausea Only    Coating on tablet  . Orange Fruit [Citrus] Anaphylaxis  . Bactrim [Sulfamethoxazole-Trimethoprim] Rash  . Tape Rash    Social History:  The patient  reports that she has never smoked. She has never used smokeless tobacco. She reports that she drinks alcohol. She reports that she does not use illicit drugs.   Family History:  The patient's family history includes Cancer in her maternal grandmother and paternal aunt; Diabetes in her father, maternal grandmother, mother, and paternal aunt; Fibromyalgia in her mother; Heart disease in her father; Hypertension in her father, mother, and sister.   ROS:  Please see the history of present illness.      All other systems reviewed and negative.   PHYSICAL EXAM: VS:  BP 114/78 mmHg  Pulse 67  Ht 5\' 5"  (1.651 m)  Wt 189 lb 12.8 oz (86.093 kg)  BMI 31.58 kg/m2  SpO2 96%  LMP 07/16/2000 Well nourished, well developed, in no acute distress HEENT: normal Neck: no JVD Cardiac:  normal S1, S2; RRR; no murmur Lungs:  clear to auscultation bilaterally, no wheezing, rhonchi or rales Abd: soft, nontender, no hepatomegaly Ext: no edema Skin: warm and dry Neuro:  CNs 2-12 intact, no focal abnormalities noted   ASSESSMENT AND PLAN: 1. Aortic calcifications with no aneurysm seen on abdominal CT 2. Palpitations with PAF noted on heart monitor.Her palpitations have significantly improved on the Toprol.   Her CHADS2VASC score is 3 (female/DM). Continue Xarelto/Toprol   3. Chest pain - atypical and is most likely related to panic attack - no ischemia on ETT and normal LVF on 2D echo to assess LVF 4. Syncope which sounds vasovagal triggered by abdominal pain - she has had several episodes like this in the past related to pain.She has been seeing GI.  She has had no other episodes of syncope.   5.  Renal mass s/p resection with multiple complications and is followed by urology 6. DM without complication 7. HTN - well  controlled.  Continue BB/ACE I 8. Dyslipidemia - Continue atorvastatin  Followup with me in 6 months     Signed, Fransico Him, MD [facility] 07/13/2014 12:10 PM

## 2014-07-14 ENCOUNTER — Ambulatory Visit: Payer: 59 | Admitting: *Deleted

## 2014-07-14 LAB — IPS PAP TEST WITH REFLEX TO HPV

## 2014-07-14 NOTE — Progress Notes (Signed)
Encounter reviewed by Dr. Louann Hopson Silva.  

## 2014-09-13 ENCOUNTER — Telehealth: Payer: Self-pay | Admitting: Cardiology

## 2014-09-13 NOTE — Telephone Encounter (Signed)
Left message to call back  

## 2014-09-13 NOTE — Telephone Encounter (Signed)
New message     patient calling wants to know what can she take over the counter for cough at night with her current heart medication.

## 2014-09-14 ENCOUNTER — Other Ambulatory Visit: Payer: Self-pay | Admitting: *Deleted

## 2014-09-14 MED ORDER — RAMIPRIL 2.5 MG PO CAPS: 2.5000 mg | ORAL_CAPSULE | Freq: Every day | ORAL | Status: DC

## 2014-10-01 ENCOUNTER — Encounter: Payer: Self-pay | Admitting: Cardiology

## 2014-11-16 ENCOUNTER — Other Ambulatory Visit: Payer: Self-pay

## 2014-11-16 DIAGNOSIS — Z1231 Encounter for screening mammogram for malignant neoplasm of breast: Secondary | ICD-10-CM

## 2014-12-06 ENCOUNTER — Encounter: Payer: Self-pay | Admitting: Cardiology

## 2014-12-09 ENCOUNTER — Encounter: Payer: Self-pay | Admitting: Cardiology

## 2014-12-09 ENCOUNTER — Ambulatory Visit
Admission: RE | Admit: 2014-12-09 | Discharge: 2014-12-09 | Disposition: A | Discharge status: Home / Self Care | Payer: BLUE CROSS/BLUE SHIELD | Source: Ambulatory Visit

## 2014-12-09 DIAGNOSIS — Z1231 Encounter for screening mammogram for malignant neoplasm of breast: Secondary | ICD-10-CM

## 2014-12-24 ENCOUNTER — Ambulatory Visit: Payer: Self-pay | Admitting: Cardiology

## 2015-02-21 ENCOUNTER — Encounter: Payer: Self-pay | Admitting: Cardiology

## 2015-02-21 ENCOUNTER — Ambulatory Visit (INDEPENDENT_AMBULATORY_CARE_PROVIDER_SITE_OTHER): Payer: BLUE CROSS/BLUE SHIELD | Admitting: Cardiology

## 2015-02-21 VITALS — BP 122/64 | HR 80 | Ht 65.0 in | Wt 183.2 lb

## 2015-02-21 DIAGNOSIS — I10 Essential (primary) hypertension: Secondary | ICD-10-CM

## 2015-02-21 DIAGNOSIS — I700 Atherosclerosis of aorta: Secondary | ICD-10-CM

## 2015-02-21 DIAGNOSIS — I480 Paroxysmal atrial fibrillation: Secondary | ICD-10-CM

## 2015-02-21 DIAGNOSIS — R55 Syncope and collapse: Secondary | ICD-10-CM

## 2015-02-21 DIAGNOSIS — E785 Hyperlipidemia, unspecified: Secondary | ICD-10-CM

## 2015-02-21 NOTE — Progress Notes (Signed)
Cardiology Office Note   Date:  02/21/2015   ID:  Courtney Olson, DOB 1965/10/08, MRN [id number]  PCP:  Courtney Miyamoto, MD    No chief complaint on file.     History of Present Illness: This is a 49yo female with a history of HTN, DM, PAF on NOAC, calcifications of the abdominal aorta by CT and remote history of chest pain with negative workup with stress test and echo. She now presents back for followup.  She denies any chest pain or SOB. Her palpitations have significantly improved on BB although the past 3 nights she did have palpitations at night. She denies any LE edema, dizziness or syncope.  She has been walking 5 - 10 miles over 2 hours daily.     Past Medical History  Diagnosis Date  . Atrophic vaginitis 01/2006  . Diabetes mellitus without complication 12/7339    on Ace inhibitor to protect kidneys  . Cytomegaloviral hepatitis 2005  . Hyperlipidemia   . Hypertension   . OSA (obstructive sleep apnea)   . RA (rheumatoid arthritis)   . GERD (gastroesophageal reflux disease)   . Syncope and collapse   . Obesity   . Postmenopausal   . PAF (paroxysmal atrial fibrillation)   . Bradycardia 05/2014  . Pancreatitis 2014  . Kidney tumor 03/2013    Right  . Gallstones 01/2013  . Ulcerative colitis 2015  . Chronic pancolonic ulcerative colitis 2015    unknown if chronic.    Past Surgical History  Procedure Laterality Date  . Dilation and curettage of uterus  2002  . Cesarean section    . Total abdominal hysterectomy w/ bilateral salpingoophorectomy  2002    Secondary to AUB  . Cholecystectomy  01/2013  . Hernia repair  01/2013  . Partial nephrectomy Right 02/2013    Tumor Removal   . Kidney and bladder stent and tube placed Right 03/2013  . Procedure to remove kidney and bladder stent and tube Right 04/2013  . Colonoscopy  2015    Colitis     Current Outpatient Prescriptions  Medication Sig Dispense Refill  . atorvastatin (LIPITOR) 20 MG tablet  Take 20 mg by mouth daily at 6 PM.   5  . Dulaglutide (TRULICITY) 9.37 TK/2.4OX SOPN Inject into the skin once a week.    Marland Kitchen EPINEPHrine (EPIPEN) 0.3 mg/0.3 mL IJ SOAJ injection Inject 0.3 mLs (0.3 mg total) into the muscle as needed. 1 Device 0  . glipiZIDE (GLUCOTROL) 5 MG tablet Take 5 mg by mouth daily before breakfast.     . hydrOXYzine (ATARAX/VISTARIL) 25 MG tablet Take 25 mg by mouth every 6 (six) hours as needed. For Allergic reactions    . JARDIANCE 25 MG TABS tablet 25 mg daily.     . mesalamine (LIALDA) 1.2 G EC tablet Take 1.2 g by mouth daily. TAKE 4  TABS DAILY    . metoprolol succinate (TOPROL-XL) 25 MG 24 hr tablet Take 0.5 tablets (12.5 mg total) by mouth every evening. 90 tablet 3  . Multiple Vitamins-Minerals (MULTIVITAMIN PO) Take 1 tablet by mouth daily.     Glory Rosebush DELICA LANCETS 73Z MISC by Does not apply route.    . ramipril (ALTACE) 2.5 MG capsule Take 1 capsule (2.5 mg total) by mouth daily. 90 capsule 3  . rivaroxaban (XARELTO) 20 MG TABS tablet Take 1 tablet (20 mg total) by mouth  daily. 90 tablet 3   No current facility-administered medications for this visit.    Allergies:   Orange fruit; Bactrim; and Tape    Social History:  The patient  reports that she has never smoked. She has never used smokeless tobacco. She reports that she drinks alcohol. She reports that she does not use illicit drugs.   Family History:  The patient's family history includes Cancer in her maternal grandmother and paternal aunt; Diabetes in her father, maternal grandmother, mother, and paternal aunt; Fibromyalgia in her mother; Heart disease in her father; Hypertension in her father, mother, and sister.    ROS:  Please see the history of present illness.   Otherwise, review of systems are positive for none.   All other systems are reviewed and negative.    PHYSICAL EXAM: VS:  BP 122/64 mmHg  Pulse 80  Ht 5\' 5"  (1.651 m)  Wt 183 lb 3.2 oz (83.099 kg)  BMI 30.49 kg/m2  SpO2 98%   LMP 07/16/2000 , BMI Body mass index is 30.49 kg/(m^2). GEN: Well nourished, well developed, in no acute distress HEENT: normal Neck: no JVD, carotid bruits, or masses Cardiac: RRR; no murmurs, rubs, or gallops,no edema  Respiratory:  clear to auscultation bilaterally, normal work of breathing GI: soft, nontender, nondistended, + BS MS: no deformity or atrophy Skin: warm and dry, no rash Neuro:  Strength and sensation are intact Psych: euthymic mood, full affect   EKG:  EKG is not ordered today.    Recent Labs: 05/17/2014: BUN 20; Creatinine, Ser 1.1; Hemoglobin 11.3*; Platelets 232.0; Potassium 4.4; Sodium 137; TSH 2.39    Lipid Panel    Component Value Date/Time   CHOL 232* 05/17/2014 1113   TRIG 172.0* 05/17/2014 1113   HDL 55.50 05/17/2014 1113   CHOLHDL 4 05/17/2014 1113   VLDL 34.4 05/17/2014 1113   LDLCALC 142* 05/17/2014 1113      Wt Readings from Last 3 Encounters:  02/21/15 183 lb 3.2 oz (83.099 kg)  07/13/14 189 lb 12.8 oz (86.093 kg)  07/12/14 188 lb (85.276 kg)     ASSESSMENT AND PLAN: 1. Aortic calcifications with no aneurysm seen on abdominal CT 2. Palpitations with PAF noted on heart monitor.Her palpitations have significantly improved on the Toprol. Her CHADS2VASC score is 3 (female/DM). Continue Xarelto/Toprol.  Check NOAC panel. 3. Chest pain - atypical and is most likely related to panic attack - no ischemia on ETT and normal LVF on 2D echo to assess LVF 4. Syncope which sounds vasovagal triggered by abdominal pain - she has had several episodes like this in the past related to pain.She has been seeing GI. She has had no other episodes of syncope.  5. Renal mass s/p resection with multiple complications and is followed by urology 6. DM without complication 7. HTN - well controlled. Continue BB/ACE I 8. Dyslipidemia - Continue atorvastatin.  Check FLP and ALT   Current medicines are reviewed at length with the patient today.  The  patient does not have concerns regarding medicines.  The following changes have been made:  no change  Labs/ tests ordered today: See above Assessment and Plan No orders of the defined types were placed in this encounter.     Disposition:   FU with me in 6 months  Signed, Sueanne Margarita, MD  02/21/2015 11:38 AM    [facility] [place], [place], [place]  [address] Phone: [phone]; Fax: [phone]

## 2015-02-21 NOTE — Patient Instructions (Signed)

## 2015-03-02 ENCOUNTER — Encounter: Payer: Self-pay | Admitting: Cardiology

## 2015-05-06 ENCOUNTER — Other Ambulatory Visit: Payer: Self-pay | Admitting: Physician Assistant

## 2015-05-10 ENCOUNTER — Telehealth: Payer: Self-pay | Admitting: Cardiology

## 2015-05-10 NOTE — Telephone Encounter (Signed)
Patient st she is considering a abdominoplasty and breast reduction after losing over 100 pounds in the last couple years. Instructed patient that if she decides to have surgery done, to have surgeon send our office a surgical clearance request. Patient agrees with treatment plan.

## 2015-05-10 NOTE — Telephone Encounter (Signed)
New Message    Pt calling wanting to speak to [facility] about upcoming surgery. Please call back and advise.

## 2015-05-26 ENCOUNTER — Telehealth: Payer: Self-pay | Admitting: Cardiology

## 2015-05-26 NOTE — Telephone Encounter (Signed)
New Message  Pt request a call back to determine if the surgical clearance was received, Filled out and faxed back.   Request for surgical clearance:  1. What type of surgery is being performed? Plastic surgery   2. When is this surgery scheduled? Not scheduled yet   3. Name of physician performing surgery? Vergil Willard    4. What is your office phone and fax number? Office number [phone]

## 2015-05-26 NOTE — Telephone Encounter (Signed)
Requested and received clearance from [facility].  To Dr. Radford Pax for review.

## 2015-05-26 NOTE — Telephone Encounter (Signed)
Informed patient clearance has been placed in MR "to be faxed" bin.  Patient grateful for callback.

## 2015-05-30 ENCOUNTER — Other Ambulatory Visit: Payer: Self-pay | Admitting: Physician Assistant

## 2015-06-16 ENCOUNTER — Encounter: Payer: Self-pay | Admitting: Cardiology

## 2015-07-20 ENCOUNTER — Encounter: Payer: Self-pay | Admitting: *Deleted

## 2015-07-20 ENCOUNTER — Encounter: Payer: Self-pay | Admitting: Nurse Practitioner

## 2015-07-20 ENCOUNTER — Ambulatory Visit: Payer: Self-pay | Admitting: Nurse Practitioner

## 2015-07-21 ENCOUNTER — Ambulatory Visit: Payer: 59 | Admitting: Nurse Practitioner

## 2015-08-19 ENCOUNTER — Other Ambulatory Visit: Payer: Self-pay | Admitting: Physician Assistant

## 2015-08-19 ENCOUNTER — Other Ambulatory Visit: Payer: Self-pay | Admitting: Cardiology

## 2015-09-14 ENCOUNTER — Ambulatory Visit: Payer: BLUE CROSS/BLUE SHIELD | Admitting: Cardiology

## 2015-10-21 ENCOUNTER — Ambulatory Visit: Payer: BLUE CROSS/BLUE SHIELD | Admitting: Cardiology

## 2015-11-16 ENCOUNTER — Other Ambulatory Visit: Payer: Self-pay | Admitting: Internal Medicine

## 2015-11-16 DIAGNOSIS — R1031 Right lower quadrant pain: Secondary | ICD-10-CM

## 2015-11-22 ENCOUNTER — Ambulatory Visit
Admission: RE | Admit: 2015-11-22 | Discharge: 2015-11-22 | Disposition: A | Discharge status: Home / Self Care | Payer: BLUE CROSS/BLUE SHIELD | Source: Ambulatory Visit | Attending: Internal Medicine | Admitting: Internal Medicine

## 2015-11-22 DIAGNOSIS — R1031 Right lower quadrant pain: Secondary | ICD-10-CM

## 2015-11-25 ENCOUNTER — Other Ambulatory Visit: Payer: Self-pay | Admitting: Cardiology

## 2015-11-25 ENCOUNTER — Other Ambulatory Visit: Payer: Self-pay

## 2015-11-25 MED ORDER — RIVAROXABAN 20 MG PO TABS: ORAL_TABLET | ORAL | Status: DC

## 2015-12-07 ENCOUNTER — Encounter: Payer: Self-pay | Admitting: Cardiology

## 2015-12-07 ENCOUNTER — Ambulatory Visit (INDEPENDENT_AMBULATORY_CARE_PROVIDER_SITE_OTHER): Payer: BLUE CROSS/BLUE SHIELD | Admitting: Cardiology

## 2015-12-07 VITALS — BP 114/66 | HR 63 | Ht 65.0 in | Wt 192.8 lb

## 2015-12-07 DIAGNOSIS — I700 Atherosclerosis of aorta: Secondary | ICD-10-CM

## 2015-12-07 DIAGNOSIS — I480 Paroxysmal atrial fibrillation: Secondary | ICD-10-CM

## 2015-12-07 DIAGNOSIS — E785 Hyperlipidemia, unspecified: Secondary | ICD-10-CM

## 2015-12-07 DIAGNOSIS — I10 Essential (primary) hypertension: Secondary | ICD-10-CM

## 2015-12-07 DIAGNOSIS — R55 Syncope and collapse: Secondary | ICD-10-CM | POA: Diagnosis not present

## 2015-12-07 NOTE — Progress Notes (Signed)
Cardiology Office Note    Date:  12/07/2015   ID:  Courtney Olson, DOB [birth year], MRN [id number]  PCP:  Abigail Miyamoto, MD  Cardiologist:  Fransico Him, MD   Chief Complaint  Patient presents with  . Atrial Fibrillation  . Hypertension    History of Present Illness:  Courtney Olson is a 50 y.o. female with a history of HTN, DM, PAF on NOAC, calcifications of the abdominal aorta by CT and remote history of chest pain with negative workup with stress test and echo. She now presents back for followup. She denies any chest pain or SOB. Her palpitations have significantly improved on BB but still gets them if she gets emotional. She denies any LE edema, dizziness or syncope. She has been walking 4-7 miles daily.      Past Medical History  Diagnosis Date  . Atrophic vaginitis 01/2006  . Diabetes mellitus without complication ([place]) 99991111    on Ace inhibitor to protect kidneys  . Cytomegaloviral hepatitis ([place]) 2005  . Hyperlipidemia   . Hypertension   . OSA (obstructive sleep apnea)   . RA (rheumatoid arthritis) ([place])   . GERD (gastroesophageal reflux disease)   . Syncope and collapse   . Obesity   . Postmenopausal   . PAF (paroxysmal atrial fibrillation) ([place])   . Bradycardia 05/2014  . Pancreatitis 2014  . Kidney tumor 03/2013    Right  . Gallstones 01/2013  . Ulcerative colitis ([place]) 2015  . Chronic pancolonic ulcerative colitis ([place]) 2015    unknown if chronic.    Past Surgical History  Procedure Laterality Date  . Dilation and curettage of uterus  2002  . Cesarean section    . Total abdominal hysterectomy w/ bilateral salpingoophorectomy  2002    Secondary to AUB  . Cholecystectomy  01/2013  . Hernia repair  01/2013  . Partial nephrectomy Right 02/2013    Tumor Removal   . Kidney and bladder stent and tube placed Right 03/2013  . Procedure to remove kidney and bladder stent and tube Right 04/2013  . Colonoscopy  2015    Colitis    Current Medications: Outpatient  Prescriptions Prior to Visit  Medication Sig Dispense Refill  . atorvastatin (LIPITOR) 20 MG tablet Take 20 mg by mouth daily at 6 PM.   5  . Dulaglutide (TRULICITY) [id number] [id number] SOPN Inject into the skin once a week.    Marland Kitchen EPINEPHrine (EPIPEN) 0.3 mg/0.3 mL IJ SOAJ injection Inject 0.3 mLs (0.3 mg total) into the muscle as needed. 1 Device 0  . hydrOXYzine (ATARAX/VISTARIL) 25 MG tablet Take 25 mg by mouth every 6 (six) hours as needed. For Allergic reactions    . mesalamine (LIALDA) 1.2 G EC tablet Take 1.2 g by mouth daily. TAKE 4  TABS DAILY    . metoprolol succinate (TOPROL-XL) 25 MG 24 hr tablet Take 0.5 tablets (12.5 mg total) by mouth every evening. 90 tablet 3  . Multiple Vitamins-Minerals (MULTIVITAMIN PO) Take 1 tablet by mouth daily.     Glory Rosebush DELICA LANCETS 99991111 MISC by Does not apply route.    . ramipril (ALTACE) 2.5 MG capsule TAKE 1 CAPSULE (2.5 MG TOTAL) BY MOUTH DAILY. 90 capsule 1  . rivaroxaban (XARELTO) 20 MG TABS tablet TAKE 1 TABLET (20 MG TOTAL) BY MOUTH DAILY. 90 tablet 3  . glipiZIDE (GLUCOTROL) 5 MG tablet Take 5 mg by mouth daily before breakfast. Reported on 12/07/2015    . JARDIANCE 25 MG TABS  tablet 25 mg daily. Reported on 12/07/2015    . metoprolol succinate (TOPROL-XL) 25 MG 24 hr tablet TAKE 0.5 TABLETS (12.5 MG TOTAL) BY MOUTH DAILY. (Patient not taking: Reported on 12/07/2015) 45 tablet 1   No facility-administered medications prior to visit.     Allergies:   Orange fruit; Bactrim; and Tape   Social History   Social History  . Marital Status: Married    Spouse Name: N/A  . Number of Children: N/A  . Years of Education: N/A   Social History Main Topics  . Smoking status: Never Smoker   . Smokeless tobacco: Never Used  . Alcohol Use: Yes     Comment: 2 a month  . Drug Use: No  . Sexual Activity:    Partners: Male    Birth Control/ Protection: Surgical     Comment: TAH/BSO   Other Topics Concern  . None   Social History Narrative      Family History:  The patient's family history includes Cancer in her maternal grandmother and paternal aunt; Diabetes in her father, maternal grandmother, mother, and paternal aunt; Fibromyalgia in her mother; Heart disease in her father; Hypertension in her father, mother, and sister.   ROS:   Please see the history of present illness.    ROS All other systems reviewed and are negative.   PHYSICAL EXAM:   VS:  BP 114/66 mmHg  Pulse 63  Ht 5\' 5"  (1.651 m)  Wt 192 lb 12.8 oz (87.454 kg)  BMI 32.08 kg/m2  LMP 07/16/2000   GEN: Well nourished, well developed, in no acute distress HEENT: normal Neck: no JVD, carotid bruits, or masses Cardiac: RRR; no murmurs, rubs, or gallops,no edema.  Intact distal pulses bilaterally.  Respiratory:  clear to auscultation bilaterally, normal work of breathing GI: soft, nontender, nondistended, + BS MS: no deformity or atrophy Skin: warm and dry, no rash Neuro:  Alert and Oriented x 3, Strength and sensation are intact Psych: euthymic mood, full affect  Wt Readings from Last 3 Encounters:  12/07/15 192 lb 12.8 oz (87.454 kg)  02/21/15 183 lb 3.2 oz (83.099 kg)  07/13/14 189 lb 12.8 oz (86.093 kg)      Studies/Labs Reviewed:   EKG:  EKG is ordered today.  The ekg ordered today demonstrates NSR at 63bpm with no ST changes  Recent Labs: No results found for requested labs within last 365 days.   Lipid Panel    Component Value Date/Time   CHOL 232* 05/17/2014 1113   TRIG 172.0* 05/17/2014 1113   HDL 55.50 05/17/2014 1113   CHOLHDL 4 05/17/2014 1113   VLDL 34.4 05/17/2014 1113   LDLCALC 142* 05/17/2014 1113    Additional studies/ records that were reviewed today include:  none    ASSESSMENT:    1. PAF (paroxysmal atrial fibrillation) ([place])   2. Aortic calcification (HCC)   3. Vasovagal syncope   4. Essential hypertension   5. Hyperlipidemia      PLAN:  In order of problems listed above:  1. PAF - maintaining NSR.   Continue BB and Xarelto.  Check NOAC panel.  2. Aortic calcifications - continue statin.  Not on ASA due to Xarelto. 3. Vasovagal syncope with no reoccurence 4. HTN - BP controlled.  Continue diuretic, BB and ACE I. 5. Hyperlipidemia - LDL goal < 70.  Continue statin.  Get last LDL from PCP.      Medication Adjustments/Labs and Tests Ordered: Current medicines are reviewed at length  with the patient today.  Concerns regarding medicines are outlined above.  Medication changes, Labs and Tests ordered today are listed in the Patient Instructions below.  There are no Patient Instructions on file for this visit.   Signed, Fransico Him, MD  12/07/2015 3:36 PM    [facility] [place], [place], [place]  [address] Phone: [phone]; Fax: [phone]

## 2015-12-07 NOTE — Patient Instructions (Signed)
Medication Instructions:  Your physician recommends that you continue on your current medications as directed. Please refer to the Current Medication list given to you today.   Labwork: None  Testing/Procedures: None  Follow-Up: Your physician wants you to follow-up in: 6 months with a PA or NP. You will receive a reminder letter in the mail two months in advance. If you don't receive a letter, please call our office to schedule the follow-up appointment.   Your physician wants you to follow-up in: 1 year with Dr. Turner. You will receive a reminder letter in the mail two months in advance. If you don't receive a letter, please call our office to schedule the follow-up appointment.   Any Other Special Instructions Will Be Listed Below (If Applicable).     If you need a refill on your cardiac medications before your next appointment, please call your pharmacy.   

## 2015-12-09 ENCOUNTER — Other Ambulatory Visit: Payer: Self-pay

## 2015-12-09 DIAGNOSIS — Z1231 Encounter for screening mammogram for malignant neoplasm of breast: Secondary | ICD-10-CM

## 2015-12-16 ENCOUNTER — Encounter: Payer: Self-pay | Admitting: Cardiology

## 2015-12-19 ENCOUNTER — Telehealth: Payer: Self-pay

## 2015-12-19 MED ORDER — ATORVASTATIN CALCIUM 40 MG PO TABS: 40.0000 mg | ORAL_TABLET | Freq: Every day | ORAL | Status: DC

## 2015-12-19 NOTE — Telephone Encounter (Signed)
-----   Message from Sueanne Margarita, MD sent at 12/16/2015  3:57 PM EDT ----- LDL 81 - increase atorvastatin to 40mg  daily and repeat FLP and ALT in 6 weeks

## 2015-12-19 NOTE — Telephone Encounter (Signed)
Informed patient of results and verbal understanding expressed.  Instructed patient to INCREASE LIPITOR to 40 mg daily. Patient requests to have lab work drawn at PCP.  Rx for lab work mailed to confirmed address to be drawn July 17-21. Patient was grateful for assistance.

## 2015-12-26 ENCOUNTER — Ambulatory Visit
Admission: RE | Admit: 2015-12-26 | Discharge: 2015-12-26 | Disposition: A | Discharge status: Home / Self Care | Payer: BLUE CROSS/BLUE SHIELD | Source: Ambulatory Visit

## 2015-12-26 ENCOUNTER — Other Ambulatory Visit: Payer: Self-pay | Admitting: Plastic Surgery

## 2015-12-26 DIAGNOSIS — Z1231 Encounter for screening mammogram for malignant neoplasm of breast: Secondary | ICD-10-CM

## 2015-12-27 ENCOUNTER — Ambulatory Visit (INDEPENDENT_AMBULATORY_CARE_PROVIDER_SITE_OTHER): Payer: BLUE CROSS/BLUE SHIELD | Admitting: Nurse Practitioner

## 2015-12-27 ENCOUNTER — Encounter: Payer: Self-pay | Admitting: Nurse Practitioner

## 2015-12-27 VITALS — BP 108/62 | HR 72 | Resp 16 | Ht 64.75 in | Wt 192.0 lb

## 2015-12-27 DIAGNOSIS — Z01419 Encounter for gynecological examination (general) (routine) without abnormal findings: Secondary | ICD-10-CM | POA: Diagnosis not present

## 2015-12-27 NOTE — Progress Notes (Signed)
50 y.o. G87P3003 Married  Caucasian Fe here for annual exam.  No new problems with A-fib.   Last HGB AIC was 7.2.  Still has vaginal dryness.  Has lost close to 100 lbs over a year.  Patient's last menstrual period was 07/16/2000.          Sexually active: Yes.    The current method of family planning is status post hysterectomy.    Exercising: Yes.    walking, weights/ machines Smoker:  no  Health Maintenance: Pap:  07/12/14 Neg MMG:  12/26/15 BIRADS1:neg Colonoscopy:  2015 ulcerative Colitis. Repeat in ? 5 yrs BMD:   10/17/2006 normal  TDaP:  10/2006  Pneumonia: ~ 2007 ? HIV: 2005  Labs: PCP   reports that she has never smoked. She has never used smokeless tobacco. She reports that she drinks alcohol. She reports that she does not use illicit drugs.  Past Medical History  Diagnosis Date  . Atrophic vaginitis 01/2006  . Diabetes mellitus without complication ([facility]) 99991111    on Ace inhibitor to protect kidneys  . Cytomegaloviral hepatitis ([place]) 2005  . Hyperlipidemia   . Hypertension   . OSA (obstructive sleep apnea)   . RA (rheumatoid arthritis) ([place])   . GERD (gastroesophageal reflux disease)   . Syncope and collapse   . Obesity   . Postmenopausal   . PAF (paroxysmal atrial fibrillation) ([facility])   . Bradycardia 05/2014  . Pancreatitis 2014  . Kidney tumor 03/2013    Right  . Gallstones 01/2013  . Ulcerative colitis ([facility]) 2015  . Chronic pancolonic ulcerative colitis ([place]) 2015    unknown if chronic.    Past Surgical History  Procedure Laterality Date  . Dilation and curettage of uterus  2002  . Cesarean section    . Total abdominal hysterectomy w/ bilateral salpingoophorectomy  2002    Secondary to AUB  . Cholecystectomy  01/2013  . Hernia repair  01/2013  . Partial nephrectomy Right 02/2013    Tumor Removal   . Kidney and bladder stent and tube placed Right 03/2013  . Procedure to remove kidney and bladder stent and tube Right 04/2013  . Colonoscopy  2015    Colitis     Current Outpatient Prescriptions  Medication Sig Dispense Refill  . atorvastatin (LIPITOR) 20 MG tablet Take 20 mg by mouth daily.  1  . CONTRAVE 8-90 MG TB12 Take 2 tablets by mouth 2 (two) times daily.  5  . Dulaglutide (TRULICITY) A999333 0000000 SOPN Inject into the skin once a week.    Marland Kitchen glipiZIDE (GLUCOTROL XL) 5 MG 24 hr tablet Take 1 tablet by mouth daily.  1  . hydrochlorothiazide (MICROZIDE) 12.5 MG capsule Take 12.5 mg by mouth daily.    . mesalamine (LIALDA) 1.2 G EC tablet Take 1.2 g by mouth daily. TAKE 4  TABS DAILY    . metoprolol succinate (TOPROL-XL) 25 MG 24 hr tablet Take 0.5 tablets (12.5 mg total) by mouth every evening. 90 tablet 3  . Multiple Vitamin (MULTIVITAMIN) tablet Take 1 tablet by mouth daily.    Marland Kitchen omeprazole (PRILOSEC) 40 MG capsule Take 40 mg by mouth daily.    Glory Rosebush DELICA LANCETS 99991111 MISC by Does not apply route.    . ramipril (ALTACE) 2.5 MG capsule TAKE 1 CAPSULE (2.5 MG TOTAL) BY MOUTH DAILY. 90 capsule 1  . rivaroxaban (XARELTO) 20 MG TABS tablet TAKE 1 TABLET (20 MG TOTAL) BY MOUTH DAILY. 90 tablet 3  . EPINEPHrine (  EPIPEN) 0.3 mg/0.3 mL IJ SOAJ injection Inject 0.3 mLs (0.3 mg total) into the muscle as needed. (Patient not taking: Reported on 12/27/2015) 1 Device 0  . glucose blood (ONE TOUCH ULTRA TEST) test strip Reported on 12/27/2015    . hydrOXYzine (ATARAX/VISTARIL) 25 MG tablet Take 25 mg by mouth every 6 (six) hours as needed. Reported on 12/27/2015     No current facility-administered medications for this visit.    Family History  Problem Relation Age of Onset  . Diabetes Mother   . Hypertension Mother   . Fibromyalgia Mother   . Hypertension Father   . Heart disease Father     CABG  . Diabetes Father   . Hypertension Sister   . Diabetes Paternal Aunt   . Cancer Paternal Aunt     bone  . Cancer Maternal Grandmother     ovarian  . Diabetes Maternal Grandmother     ROS:  Pertinent items are noted in HPI.  Otherwise, a  comprehensive ROS was negative.  Exam:   BP 108/62 mmHg  Pulse 72  Resp 16  Ht 5' 4.75" (1.645 m)  Wt 192 lb (87.091 kg)  BMI 32.18 kg/m2  LMP 07/16/2000 Height: 5' 4.75" (164.5 cm) Ht Readings from Last 3 Encounters:  12/27/15 5' 4.75" (1.645 m)  12/07/15 5\' 5"  (1.651 m)  02/21/15 5\' 5"  (1.651 m)    General appearance: alert, cooperative and appears stated age Head: Normocephalic, without obvious abnormality, atraumatic Neck: no adenopathy, supple, symmetrical, trachea midline and thyroid normal to inspection and palpation Lungs: clear to auscultation bilaterally Breasts: normal appearance, no masses or tenderness Heart: regular rate and rhythm Abdomen: soft, non-tender; no masses,  no organomegaly Extremities: extremities normal, atraumatic, no cyanosis or edema Skin: Skin color, texture, turgor normal. No rashes or lesions Lymph nodes: Cervical, supraclavicular, and axillary nodes normal. No abnormal inguinal nodes palpated Neurologic: Grossly normal   Pelvic: External genitalia:  no lesions              Urethra:  normal appearing urethra with no masses, tenderness or lesions              Bartholin's and Skene's: normal                 Vagina: normal appearing vagina with normal color and discharge, no lesions              Cervix: absent              Pap taken: No. Bimanual Exam:  Uterus:  uterus absent              Adnexa: no mass, fullness, tenderness               Rectovaginal: Confirms               Anus:  normal sphincter tone, no lesions  Chaperone present: no  A:  Well Woman with normal exam  S/P TAH/BSO secondary to AUB 2002 History of DM, HTN, idiopathic edema history of weight loss from 287 lb. to 192 lbs. currently S/P Lap Chole with pancreatitis S/P Right Partial Nephrectomy 03/11/13 for benign mass with post op complications requiring nephrostomy tubes and stents. New diagnoses of A Fib 2016 -  on Xeralto   P:   Reviewed health and wellness pertinent to exam  Pap smear as above  Mammogram is due 12/2016  Counseled on breast self exam, mammography screening, adequate intake of calcium and vitamin  D, diet and exercise, Kegel's exercises return annually or prn  An After Visit Summary was printed and given to the patient.

## 2015-12-27 NOTE — Patient Instructions (Addendum)

## 2015-12-30 NOTE — Progress Notes (Signed)
Encounter reviewed by Dr. Brook Amundson C. Silva.  

## 2016-02-29 ENCOUNTER — Other Ambulatory Visit: Payer: Self-pay | Admitting: Cardiology

## 2016-03-29 ENCOUNTER — Other Ambulatory Visit: Payer: Self-pay | Admitting: Cardiology

## 2016-04-05 ENCOUNTER — Other Ambulatory Visit: Payer: Self-pay | Admitting: Cardiology

## 2016-04-19 ENCOUNTER — Encounter: Payer: Self-pay | Admitting: Nurse Practitioner

## 2016-05-15 ENCOUNTER — Encounter: Payer: Self-pay | Admitting: Physician Assistant

## 2016-05-28 ENCOUNTER — Encounter: Payer: Self-pay | Admitting: Physician Assistant

## 2016-05-28 NOTE — Progress Notes (Signed)
Cardiology Office Note    Date:  05/29/2016  ID:  Courtney Olson, DOB 1966-06-07, MRN [id number] PCP:  Judge Stall, FNP  Cardiologist:  Dr. Radford Pax   Chief Complaint: f/u afib  History of Present Illness:  Courtney Olson is a 50 y.o. female with history of HTN, DM, PAF on NOAC, h/o vasovagal syncope, pancolitis, calcifications of the abdominal aorta by CT, CMV hepatitis, hyperlipidemia, OSA, RA, GERD, obesity, bradycardia, pancreatitis who presents for follow-up. She has remote history of chest pain with negative workup with stress test and echo. Nuc 07/2012 was normal. 2D echo 03/2014: technically difficult, EF 55-60%, grossly normal wall motion, normal thickness, diastolic dysfunction. ETT 04/2014 was normal. She has had pancreatitis and was diagnosed with a kidney mass and underwent a large resection of a benign mass complicated by multiple infections. In July 2015 she had an episode of syncope following severe abdominal pain, felt vasovagal in nature. She actually reports about 40 episodes of syncope in the last 10 years. These initially began in the setting of anaphylactic reactions but in more recent years have followed acute abdominal pain related to her ulcerative colitis. She reports 2 episodes in the last 2 months - one happened about 2 months ago while driving [place] the interstate to [place]. She began to feel worsening abdominal pain with fecal urgency then developed the stereotypical funny feeling in the back of her head that she usually gets before syncope, and apparently passed out on the floor of [employer]. She was taken by EMS to [facility] ER, does not sound like she required admission, no records available for review. She had another episode last Tuesday - had been having abdominal cramping during bible study. Felt like she needed to leave so got home and passed out in the bathroom while trying to have a BM. She has not had any major injuries as a result of these because she's usually able to  prepare herself and call for help due to the prodrome. She has not had any syncope in the absence of abdominal discomfort (aside from remote anyphylactic events). She states she's been able to lessen these episodes by drinking Powerade and taking Pepcid AC. Lialda has also helped tremendously. The episodes tend to be worse when she feels dehydrated. However, per our discussion, she is hesitant to discontinue the HCTZ because she states it has helped control her BP swings and mild edema. She states in general her atrial fib is well controlled but did feel some palpitations yesterday which were unusual. No bleeding issues noted on the Xarelto.   Last labs on file from 01/2015 showed Hgb 13.3, Plt 167, Na 142, K 4.4, BUN 18, Cr 0.97, LFTs wnl. Has labs with PCP on Thursday - she states typically Dr. Radford Pax will tell her what labs she'd like added onto these so that they're drawn at the same time.   Past Medical History:  Diagnosis Date  . Aortic calcification ([facility])   . Atrophic vaginitis 01/2006  . Bradycardia 05/2014  . Chronic pancolonic ulcerative colitis ([place]) 2015   unknown if chronic.  Marland Kitchen Cytomegaloviral hepatitis ([place]) 2005  . Diabetes mellitus without complication ([place]) 99991111   on Ace inhibitor to protect kidneys  . Gallstones 01/2013  . GERD (gastroesophageal reflux disease)   . Hyperlipidemia   . Hypertension   . Kidney tumor 03/2013   Right  . Obesity   . OSA (obstructive sleep apnea)   . PAF (paroxysmal atrial fibrillation) ([place])   . Pancreatitis 2014  .  Postmenopausal   . RA (rheumatoid arthritis) ([facility])   . Syncope and collapse   . Ulcerative colitis ([facility]) 2015    Past Surgical History:  Procedure Laterality Date  . CESAREAN SECTION    . CHOLECYSTECTOMY  01/2013  . COLONOSCOPY  2015   Colitis  . DILATION AND CURETTAGE OF UTERUS  2002  . HERNIA REPAIR  01/2013  . Kidney and Bladder Stent and tube placed Right 03/2013  . PARTIAL NEPHRECTOMY Right 02/2013   Tumor Removal   .  Procedure to remove kidney and bladder stent and tube Right 04/2013  . TOTAL ABDOMINAL HYSTERECTOMY W/ BILATERAL SALPINGOOPHORECTOMY  2002   Secondary to AUB    Current Medications: Current Outpatient Prescriptions  Medication Sig Dispense Refill  . atorvastatin (LIPITOR) 20 MG tablet Take 20 mg by mouth daily.  1  . Dulaglutide (TRULICITY) A999333 0000000 SOPN Inject into the skin once a week.    Marland Kitchen EPINEPHrine (EPIPEN) 0.3 mg/0.3 mL IJ SOAJ injection Inject 0.3 mLs (0.3 mg total) into the muscle as needed. 1 Device 0  . glipiZIDE (GLUCOTROL XL) 5 MG 24 hr tablet Take 1 tablet by mouth daily.  1  . glucose blood (ONE TOUCH ULTRA TEST) test strip Reported on 12/27/2015    . hydrochlorothiazide (MICROZIDE) 12.5 MG capsule Take 12.5 mg by mouth daily.    . hydrOXYzine (ATARAX/VISTARIL) 25 MG tablet Take 25 mg by mouth every 6 (six) hours as needed. Reported on 12/27/2015    . mesalamine (LIALDA) 1.2 G EC tablet Take 1.2 g by mouth daily. TAKE 4  TABS DAILY    . metoprolol succinate (TOPROL-XL) 25 MG 24 hr tablet Take 0.5 tablets (12.5 mg total) by mouth every evening. 90 tablet 3  . omeprazole (PRILOSEC) 40 MG capsule Take 40 mg by mouth daily.    Glory Rosebush DELICA LANCETS 99991111 MISC by Does not apply route.    . ramipril (ALTACE) 2.5 MG capsule TAKE 1 CAPSULE (2.5 MG TOTAL) BY MOUTH DAILY. 90 capsule 2  . rivaroxaban (XARELTO) 20 MG TABS tablet TAKE 1 TABLET (20 MG TOTAL) BY MOUTH DAILY. 90 tablet 3   No current facility-administered medications for this visit.      Allergies:   Orange fruit [citrus]; Bactrim [sulfamethoxazole-trimethoprim]; and Tape   Social History   Social History  . Marital status: Married    Spouse name: N/A  . Number of children: N/A  . Years of education: N/A   Social History Main Topics  . Smoking status: Never Smoker  . Smokeless tobacco: Never Used  . Alcohol use Yes     Comment: 2 a month  . Drug use: No  . Sexual activity: Yes    Partners: Male    Birth  control/ protection: Surgical     Comment: TAH/BSO   Other Topics Concern  . None   Social History Narrative  . None     Family History:  The patient's family history includes Cancer in her maternal grandmother and paternal aunt; Diabetes in her father, maternal grandmother, mother, and paternal aunt; Fibromyalgia in her mother; Heart disease in her father; Hypertension in her father, mother, and sister.   ROS:   Please see the history of present illness.  All other systems are reviewed and otherwise negative.    PHYSICAL EXAM:   VS:  BP 110/88 (BP Location: Left Arm, Patient Position: Sitting, Cuff Size: Normal)   Pulse 74   Ht 5\' 4"  (1.626 m)   Wt  203 lb (92.1 kg)   LMP 07/16/2000   SpO2 95%   BMI 34.84 kg/m   BMI: Body mass index is 34.84 kg/m. GEN: Well nourished, well developed WF, in no acute distress  HEENT: normocephalic, atraumatic Neck: no JVD, carotid bruits, or masses Cardiac: RRR; no murmurs, rubs, or gallops, no edema  Respiratory:  clear to auscultation bilaterally, normal work of breathing GI: soft, nontender, nondistended, + BS MS: no deformity or atrophy  Skin: warm and dry, no rash Neuro:  Alert and Oriented x 3, Strength and sensation are intact, follows commands Psych: euthymic mood, full affect  Wt Readings from Last 3 Encounters:  05/29/16 203 lb (92.1 kg)  12/27/15 192 lb (87.1 kg)  12/07/15 192 lb 12.8 oz (87.5 kg)      Studies/Labs Reviewed:   EKG:  EKG was ordered today and personally reviewed by me and demonstrates NSR 74bpm, no acute changes.  Recent Labs: No results found for requested labs within last 8760 hours.   Lipid Panel    Component Value Date/Time   CHOL 232 (H) 05/17/2014 1113   TRIG 172.0 (H) 05/17/2014 1113   HDL 55.50 05/17/2014 1113   CHOLHDL 4 05/17/2014 1113   VLDL 34.4 05/17/2014 1113   LDLCALC 142 (H) 05/17/2014 1113    Additional studies/ records that were reviewed today include: Summarized  above.    ASSESSMENT & PLAN:   1. Vasovagal syncope - every episode has been precipitated by acute abdominal pain. As such, she avoids driving during times of abdominal pain because of this sequelae. I advised it may be prudent for her to hold off driving until we obtain more information and demonstrate stability of her symptoms. She has never worn a heart monitor. I think given her AF, it would be prudent to screen for any underlying arrhythmias contributing to her syncope. Will arrange 30 day monitor. Will also update echocardiogram. Would ultimately recommend continued treatment of the underlying precipitant of abdominal pain. She actually has f/u with GI this afternoon. She also has labs planned with her PCP later this week. She states typically our office just lets her know what labs we would like at that time. I have recommended CMET, CBC, TSH, Mg and lipid panel. I requested she have them send [place] a copy. 2. Paroxysmal atrial fib - continue Xarelto. F/u screening labs as above. 3. Aortic calcification - continue statin. Not on ASA due to ongoing Xarelto therapy. 4. Hyperlipidemia - continue statin. F/u lipids/LFTs when available. 5. HTN - controlled. We reviewed symptoms of orthostatic-driven syncope which it does not sound like she has at present time. She prefers to continue HCTZ for now as above. Monitor carefully.  Disposition: F/u with Dr. Radford Pax or me in 6 weeks. Of note, she also states she was planning a breast reduction surgery in December but has decided to put this off for now.    Medication Adjustments/Labs and Tests Ordered: Current medicines are reviewed at length with the patient today.  Concerns regarding medicines are outlined above. Medication changes, Labs and Tests ordered today are summarized above and listed in the Patient Instructions accessible in Encounters.   Raechel Ache PA-C  05/29/2016 1:59 PM    [facility] [place],  [place], [place]  [address] Phone: [phone]; Fax: [phone]

## 2016-05-29 ENCOUNTER — Encounter (INDEPENDENT_AMBULATORY_CARE_PROVIDER_SITE_OTHER): Payer: Self-pay

## 2016-05-29 ENCOUNTER — Other Ambulatory Visit: Payer: Self-pay | Admitting: Gastroenterology

## 2016-05-29 ENCOUNTER — Encounter: Payer: Self-pay | Admitting: Physician Assistant

## 2016-05-29 ENCOUNTER — Ambulatory Visit (INDEPENDENT_AMBULATORY_CARE_PROVIDER_SITE_OTHER): Payer: BLUE CROSS/BLUE SHIELD | Admitting: Physician Assistant

## 2016-05-29 VITALS — BP 110/88 | HR 74 | Ht 64.0 in | Wt 203.0 lb

## 2016-05-29 DIAGNOSIS — I700 Atherosclerosis of aorta: Secondary | ICD-10-CM | POA: Diagnosis not present

## 2016-05-29 DIAGNOSIS — R1033 Periumbilical pain: Secondary | ICD-10-CM

## 2016-05-29 DIAGNOSIS — I480 Paroxysmal atrial fibrillation: Secondary | ICD-10-CM

## 2016-05-29 DIAGNOSIS — E785 Hyperlipidemia, unspecified: Secondary | ICD-10-CM

## 2016-05-29 DIAGNOSIS — I10 Essential (primary) hypertension: Secondary | ICD-10-CM

## 2016-05-29 DIAGNOSIS — K51019 Ulcerative (chronic) pancolitis with unspecified complications: Secondary | ICD-10-CM

## 2016-05-29 DIAGNOSIS — R55 Syncope and collapse: Secondary | ICD-10-CM | POA: Diagnosis not present

## 2016-05-29 MED ORDER — RAMIPRIL 2.5 MG PO CAPS: 2.5000 mg | ORAL_CAPSULE | Freq: Every day | ORAL | 3 refills | Status: DC

## 2016-05-29 MED ORDER — METOPROLOL SUCCINATE ER 25 MG PO TB24: 12.5000 mg | ORAL_TABLET | Freq: Every evening | ORAL | 3 refills | Status: DC

## 2016-05-29 MED ORDER — HYDROCHLOROTHIAZIDE 12.5 MG PO CAPS: 12.5000 mg | ORAL_CAPSULE | Freq: Every day | ORAL | 3 refills | Status: DC

## 2016-05-29 MED ORDER — ATORVASTATIN CALCIUM 20 MG PO TABS: 20.0000 mg | ORAL_TABLET | Freq: Every day | ORAL | 3 refills | Status: DC

## 2016-05-29 MED ORDER — RIVAROXABAN 20 MG PO TABS: ORAL_TABLET | ORAL | 3 refills | Status: DC

## 2016-05-29 NOTE — Patient Instructions (Addendum)
Medication Instructions:  Your physician recommends that you continue on your current medications as directed. Please refer to the Current Medication list given to you today.  Labwork: Your physician recommends that your primary care doctor add these labs to your visit- CBC, CMET, Mg, TSH, Lipid panel  Testing/Procedures: Your physician has requested that you have an echocardiogram. Echocardiography is a painless test that uses sound waves to create images of your heart. It provides your doctor with information about the size and shape of your heart and how well your heart's chambers and valves are working. This procedure takes approximately one hour. There are no restrictions for this procedure.  Your physician has recommended that you wear an event monitor. Event monitors are medical devices that record the heart's electrical activity. Doctors most often [place] these monitors to diagnose arrhythmias. Arrhythmias are problems with the speed or rhythm of the heartbeat. The monitor is a small, portable device. You can wear one while you do your normal daily activities. This is usually used to diagnose what is causing palpitations/syncope (passing out).   Follow-Up: Your physician wants you to follow-up in: 6 weeks with Dr. Radford Pax or Melina Copa PA.   If you need a refill on your cardiac medications before your next appointment, please call your pharmacy.

## 2016-05-30 ENCOUNTER — Encounter: Payer: Self-pay | Admitting: Physician Assistant

## 2016-05-31 ENCOUNTER — Encounter: Payer: Self-pay | Admitting: Physician Assistant

## 2016-06-01 ENCOUNTER — Ambulatory Visit (HOSPITAL_COMMUNITY): Payer: BLUE CROSS/BLUE SHIELD | Attending: Physician Assistant

## 2016-06-01 ENCOUNTER — Encounter: Payer: Self-pay | Admitting: Physician Assistant

## 2016-06-01 ENCOUNTER — Other Ambulatory Visit: Payer: Self-pay

## 2016-06-01 DIAGNOSIS — I071 Rheumatic tricuspid insufficiency: Secondary | ICD-10-CM | POA: Diagnosis not present

## 2016-06-01 DIAGNOSIS — E785 Hyperlipidemia, unspecified: Secondary | ICD-10-CM | POA: Insufficient documentation

## 2016-06-01 DIAGNOSIS — Z6834 Body mass index (BMI) 34.0-34.9, adult: Secondary | ICD-10-CM | POA: Insufficient documentation

## 2016-06-01 DIAGNOSIS — I10 Essential (primary) hypertension: Secondary | ICD-10-CM | POA: Diagnosis not present

## 2016-06-01 DIAGNOSIS — E119 Type 2 diabetes mellitus without complications: Secondary | ICD-10-CM | POA: Insufficient documentation

## 2016-06-01 DIAGNOSIS — E669 Obesity, unspecified: Secondary | ICD-10-CM | POA: Diagnosis not present

## 2016-06-01 DIAGNOSIS — R55 Syncope and collapse: Secondary | ICD-10-CM | POA: Insufficient documentation

## 2016-06-01 DIAGNOSIS — I4891 Unspecified atrial fibrillation: Secondary | ICD-10-CM | POA: Insufficient documentation

## 2016-06-05 ENCOUNTER — Other Ambulatory Visit: Payer: Self-pay | Admitting: Physician Assistant

## 2016-06-05 ENCOUNTER — Ambulatory Visit
Admission: RE | Admit: 2016-06-05 | Discharge: 2016-06-05 | Disposition: A | Discharge status: Home / Self Care | Payer: BLUE CROSS/BLUE SHIELD | Source: Ambulatory Visit | Attending: Gastroenterology | Admitting: Gastroenterology

## 2016-06-05 DIAGNOSIS — K51019 Ulcerative (chronic) pancolitis with unspecified complications: Secondary | ICD-10-CM

## 2016-06-05 DIAGNOSIS — R55 Syncope and collapse: Secondary | ICD-10-CM

## 2016-06-05 DIAGNOSIS — I480 Paroxysmal atrial fibrillation: Secondary | ICD-10-CM

## 2016-06-05 DIAGNOSIS — R1033 Periumbilical pain: Secondary | ICD-10-CM

## 2016-06-05 MED ORDER — IOPAMIDOL (ISOVUE-300) INJECTION 61%: 100.0000 mL | Freq: Once | INTRAVENOUS | Status: DC | PRN

## 2016-06-06 ENCOUNTER — Ambulatory Visit (INDEPENDENT_AMBULATORY_CARE_PROVIDER_SITE_OTHER): Payer: BLUE CROSS/BLUE SHIELD

## 2016-06-06 DIAGNOSIS — I480 Paroxysmal atrial fibrillation: Secondary | ICD-10-CM | POA: Diagnosis not present

## 2016-06-06 DIAGNOSIS — R55 Syncope and collapse: Secondary | ICD-10-CM

## 2016-06-13 ENCOUNTER — Other Ambulatory Visit: Payer: Self-pay | Admitting: Family Medicine

## 2016-06-13 DIAGNOSIS — R935 Abnormal findings on diagnostic imaging of other abdominal regions, including retroperitoneum: Secondary | ICD-10-CM

## 2016-06-14 ENCOUNTER — Ambulatory Visit: Payer: BLUE CROSS/BLUE SHIELD

## 2016-06-15 ENCOUNTER — Other Ambulatory Visit: Payer: Self-pay | Admitting: Family Medicine

## 2016-06-15 DIAGNOSIS — R935 Abnormal findings on diagnostic imaging of other abdominal regions, including retroperitoneum: Secondary | ICD-10-CM

## 2016-06-24 ENCOUNTER — Ambulatory Visit
Admission: RE | Admit: 2016-06-24 | Discharge: 2016-06-24 | Disposition: A | Discharge status: Home / Self Care | Payer: BLUE CROSS/BLUE SHIELD | Source: Ambulatory Visit | Attending: Family Medicine | Admitting: Family Medicine

## 2016-06-24 DIAGNOSIS — R935 Abnormal findings on diagnostic imaging of other abdominal regions, including retroperitoneum: Secondary | ICD-10-CM

## 2016-06-24 MED ORDER — GADOBENATE DIMEGLUMINE 529 MG/ML IV SOLN
19.0000 mL | Freq: Once | INTRAVENOUS | Status: AC | PRN
  Administered 2016-06-24: 19 mL via INTRAVENOUS

## 2016-06-28 ENCOUNTER — Encounter: Payer: Self-pay | Admitting: Physician Assistant

## 2016-07-10 ENCOUNTER — Ambulatory Visit: Payer: BLUE CROSS/BLUE SHIELD | Admitting: Physician Assistant

## 2016-07-10 ENCOUNTER — Telehealth: Payer: Self-pay | Admitting: Physician Assistant

## 2016-07-10 NOTE — Telephone Encounter (Signed)
New message      Pt has an appt today.  She turned in her monitor on last Saturday.  She want to know if we have enough info for her to keep this appt?  She already has her echo results.  Also, she is keeping her sick grandchild and really do not want to come unless we have her monitor results.  Please call

## 2016-07-10 NOTE — Progress Notes (Deleted)
Cardiology Office Note    Date:  07/10/2016  ID:  Courtney Olson, DOB 1965-11-10, MRN [id number] PCP:  Judge Stall, FNP  Cardiologist:  Dr. Radford Pax   Chief Complaint: f/u syncope  History of Present Illness:  Courtney Olson is a 50 y.o. female with history of HTN, DM, PAF on NOAC, h/o vasovagal syncope, pancolitis, calcifications of the abdominal aorta by CT, CMV hepatitis, hyperlipidemia, OSA, RA, GERD, obesity, bradycardia, pancreatitis who presents for follow-up. She has remote history of chest pain with negative workup with stress test and echo. Nuc 07/2012 was normal. 2D echo 03/2014: technically difficult, EF 55-60%, grossly normal wall motion, normal thickness, diastolic dysfunction. ETT 04/2014 was normal. She has had pancreatitis and was diagnosed with a kidney mass and underwent a large resection of a benign mass complicated by multiple infections. In July 2015 she had an episode of syncope following severe abdominal pain, felt vasovagal in nature. She actually reports about 40 episodes of syncope in the last 10 years. These initially began in the setting of anaphylactic reactions but in more recent years have followed acute abdominal pain related to her ulcerative colitis. She presented to the office for follow-up in 05/2016 and had reported 2 more episodes, also occurring in the setting of acute abdominal pain. She had been able to lessen these episodes by Powerade, Pepcid, and Lialda. Lialda has also helped tremendously. The episodes have tended to be worse when she feels dehydrated but she has been hesitant to stop HCTZ because she states it has helped control her BP swings and mild edema. She has felt in general her AF is well controlled. 2D echo 06/01/16: EF 60-65%, no sig abnormalities. 30 day monitor showed ***. Labs 05/2016: TSH 1.8, Mg 1.8 (Mg supp not rx'd due to h/o GI issues), lipase 15, CRP 4.2, Hgb 12.6, Plt 196, WBC 7.2, A1c 8, Cr 0.94, BUN 20, K 4.2, albumin 4.2, Tbili 1.2, ALP 83,  AST 27, ALT 32.  ***1. Vasovagal syncope - every episode has been precipitated by acute abdominal pain. As such, she avoids driving during times of abdominal pain because of this sequelae. I advised it may be prudent for her to hold off driving until we obtain more information and demonstrate stability of her symptoms. She has never worn a heart monitor. I think given her AF, it would be prudent to screen for any underlying arrhythmias contributing to her syncope. Will arrange 30 day monitor. Will also update echocardiogram. Would ultimately recommend continued treatment of the underlying precipitant of abdominal pain. She actually has f/u with GI this afternoon. She also has labs planned with her PCP later this week. She states typically our office just lets her know what labs we would like at that time. I have recommended CMET, CBC, TSH, Mg and lipid panel. I requested she have them send Courtney Olson a copy. 2. Paroxysmal atrial fib - continue Xarelto. F/u screening labs as above. 3. Aortic calcification - continue statin. Not on ASA due to ongoing Xarelto therapy. 4. Hyperlipidemia - continue statin. F/u lipids/LFTs when available. 5. HTN - controlled. We reviewed symptoms of orthostatic-driven syncope which it does not sound like she has at present time. She prefers to continue HCTZ for now as above. Monitor carefully.  Past Medical History:  Diagnosis Date  . Aortic calcification (HCC)   . Atrophic vaginitis 01/2006  . Bradycardia 05/2014  . Chronic pancolonic ulcerative colitis ([facility]) 2015   unknown if chronic.  Marland Kitchen Cytomegaloviral hepatitis ([facility]) 2005  .  Diabetes mellitus without complication ([facility]) 99991111   on Ace inhibitor to protect kidneys  . Gallstones 01/2013  . GERD (gastroesophageal reflux disease)   . Hyperlipidemia   . Hypertension   . Kidney tumor 03/2013   Right  . Obesity   . OSA (obstructive sleep apnea)   . PAF (paroxysmal atrial fibrillation) ([facility])   . Pancreatitis 2014  .  Postmenopausal   . RA (rheumatoid arthritis) ([facility])   . Syncope and collapse   . Ulcerative colitis ([facility]) 2015    Past Surgical History:  Procedure Laterality Date  . CESAREAN SECTION    . CHOLECYSTECTOMY  01/2013  . COLONOSCOPY  2015   Colitis  . DILATION AND CURETTAGE OF UTERUS  2002  . HERNIA REPAIR  01/2013  . Kidney and Bladder Stent and tube placed Right 03/2013  . PARTIAL NEPHRECTOMY Right 02/2013   Tumor Removal   . Procedure to remove kidney and bladder stent and tube Right 04/2013  . TOTAL ABDOMINAL HYSTERECTOMY W/ BILATERAL SALPINGOOPHORECTOMY  2002   Secondary to AUB    Current Medications: Current Outpatient Prescriptions  Medication Sig Dispense Refill  . atorvastatin (LIPITOR) 20 MG tablet Take 1 tablet (20 mg total) by mouth daily. 90 tablet 3  . Dulaglutide (TRULICITY) [id number] [id number] SOPN Inject into the skin once a week. As directed    . EPINEPHrine (EPIPEN 2-PAK) 0.3 mg/0.3 mL IJ SOAJ injection Inject 0.3 mg into the muscle as directed. Use once as needed    . glipiZIDE (GLUCOTROL XL) 5 MG 24 hr tablet Take 1 tablet by mouth daily.  1  . glucose blood (ONE TOUCH ULTRA TEST) test strip Reported on 12/27/2015    . hydrochlorothiazide (MICROZIDE) 12.5 MG capsule Take 1 capsule (12.5 mg total) by mouth daily. 90 capsule 3  . hydrOXYzine (ATARAX/VISTARIL) 25 MG tablet Take 25 mg by mouth every 6 (six) hours as needed (as directed). Reported on 12/27/2015    . mesalamine (LIALDA) 1.2 G EC tablet Take 1.2 g by mouth daily. TAKE 4 TABS DAILY    . metoprolol succinate (TOPROL-XL) 25 MG 24 hr tablet Take 0.5 tablets (12.5 mg total) by mouth every evening. 90 tablet 3  . omeprazole (PRILOSEC) 40 MG capsule Take 40 mg by mouth daily.    Glory Rosebush DELICA LANCETS 99991111 MISC by Does not apply route.    . ramipril (ALTACE) 2.5 MG capsule Take 1 capsule (2.5 mg total) by mouth daily. 90 capsule 3  . rivaroxaban (XARELTO) 20 MG TABS tablet TAKE 1 TABLET (20 MG TOTAL) BY MOUTH DAILY. 90  tablet 3   No current facility-administered medications for this visit.      Allergies:   Orange fruit [citrus]; Bactrim [sulfamethoxazole-trimethoprim]; and Tape   Social History   Social History  . Marital status: Married    Spouse name: N/A  . Number of children: N/A  . Years of education: N/A   Social History Main Topics  . Smoking status: Never Smoker  . Smokeless tobacco: Never Used  . Alcohol use Yes     Comment: 2 a month  . Drug use: No  . Sexual activity: Yes    Partners: Male    Birth control/ protection: Surgical     Comment: TAH/BSO   Other Topics Concern  . Not on file   Social History Narrative  . No narrative on file     Family History:  The patient's family history includes Cancer in her maternal grandmother and paternal  aunt; Diabetes in her father, maternal grandmother, mother, and paternal aunt; Fibromyalgia in her mother; Heart disease in her father; Hypertension in her father, mother, and sister. ***  ROS:   Please see the history of present illness. Otherwise, review of systems is positive for ***.  All other systems are reviewed and otherwise negative.    PHYSICAL EXAM:   VS:  LMP 07/16/2000   BMI: There is no height or weight on file to calculate BMI. GEN: Well nourished, well developed, in no acute distress  HEENT: normocephalic, atraumatic Neck: no JVD, carotid bruits, or masses Cardiac: ***RRR; no murmurs, rubs, or gallops, no edema  Respiratory:  clear to auscultation bilaterally, normal work of breathing GI: soft, nontender, nondistended, + BS MS: no deformity or atrophy  Skin: warm and dry, no rash Neuro:  Alert and Oriented x 3, Strength and sensation are intact, follows commands Psych: euthymic mood, full affect  Wt Readings from Last 3 Encounters:  05/29/16 203 lb (92.1 kg)  12/27/15 192 lb (87.1 kg)  12/07/15 192 lb 12.8 oz (87.5 kg)      Studies/Labs Reviewed:   EKG:  EKG was ordered today and personally reviewed by me  and demonstrates *** EKG was not ordered today.***  Recent Labs: No results found for requested labs within last 8760 hours.   Lipid Panel    Component Value Date/Time   CHOL 232 (H) 05/17/2014 1113   TRIG 172.0 (H) 05/17/2014 1113   HDL 55.50 05/17/2014 1113   CHOLHDL 4 05/17/2014 1113   VLDL 34.4 05/17/2014 1113   LDLCALC 142 (H) 05/17/2014 1113    Additional studies/ records that were reviewed today include: Summarized above.***    ASSESSMENT & PLAN:   1. Vasovagal syncope 2. Paroxysmal atrial fib 3. Aortic calcification 4. Essential HTN  Disposition: F/u with ***   Medication Adjustments/Labs and Tests Ordered: Current medicines are reviewed at length with the patient today.  Concerns regarding medicines are outlined above. Medication changes, Labs and Tests ordered today are summarized above and listed in the Patient Instructions accessible in Encounters.   Raechel Ache PA-C  07/10/2016 11:47 AM    [address], [place], [place]  [address] Phone: [phone]; Fax: [phone]

## 2016-07-10 NOTE — Telephone Encounter (Signed)
Returned pts call.  Per Melina Copa, PA-C, we will just call pt when we get the heart monitor results and if pt needs a f/u appt, we will schedule at that time.  Pt agreeable with this plan and appt has been cancelled.

## 2016-07-12 ENCOUNTER — Telehealth: Payer: Self-pay | Admitting: Cardiology

## 2016-07-12 NOTE — Telephone Encounter (Signed)
New Message;    Pt is having a Colonoscopy right now,question about her Xarelto. She thinks she took her Xarelto yesterday.Can she still have her Colonoscopy. Needs an answer right away.

## 2016-07-12 NOTE — Telephone Encounter (Signed)
Follow up    Pt verbalized that she is calling for rn

## 2016-07-12 NOTE — Telephone Encounter (Signed)
Informed patient that if she did take her Xarelto, her bleeding risk is higher, and Dr. Radford Pax recommended holding it for 48 hours prior to procedure.  Informed her that it is ultimately the doctor's decision to proceed, but it is not recommended from a bleeding standpoint. She was grateful for call.

## 2016-07-18 ENCOUNTER — Ambulatory Visit: Payer: BLUE CROSS/BLUE SHIELD | Admitting: Cardiology

## 2016-11-21 ENCOUNTER — Emergency Department (HOSPITAL_COMMUNITY)
Admission: EM | Admit: 2016-11-21 | Discharge: 2016-11-21 | Disposition: A | Discharge status: Home / Self Care | Payer: BLUE CROSS/BLUE SHIELD | Attending: Emergency Medicine | Admitting: Emergency Medicine

## 2016-11-21 ENCOUNTER — Encounter (HOSPITAL_COMMUNITY): Payer: Self-pay | Admitting: Emergency Medicine

## 2016-11-21 DIAGNOSIS — Z7901 Long term (current) use of anticoagulants: Secondary | ICD-10-CM | POA: Insufficient documentation

## 2016-11-21 DIAGNOSIS — T7800XA Anaphylactic reaction due to unspecified food, initial encounter: Secondary | ICD-10-CM | POA: Diagnosis not present

## 2016-11-21 DIAGNOSIS — T782XXA Anaphylactic shock, unspecified, initial encounter: Secondary | ICD-10-CM

## 2016-11-21 DIAGNOSIS — T781XXA Other adverse food reactions, not elsewhere classified, initial encounter: Secondary | ICD-10-CM | POA: Diagnosis present

## 2016-11-21 DIAGNOSIS — Z91018 Allergy to other foods: Secondary | ICD-10-CM | POA: Diagnosis not present

## 2016-11-21 DIAGNOSIS — I10 Essential (primary) hypertension: Secondary | ICD-10-CM | POA: Insufficient documentation

## 2016-11-21 DIAGNOSIS — E119 Type 2 diabetes mellitus without complications: Secondary | ICD-10-CM | POA: Diagnosis not present

## 2016-11-21 DIAGNOSIS — Z7984 Long term (current) use of oral hypoglycemic drugs: Secondary | ICD-10-CM | POA: Insufficient documentation

## 2016-11-21 DIAGNOSIS — Z79899 Other long term (current) drug therapy: Secondary | ICD-10-CM | POA: Insufficient documentation

## 2016-11-21 LAB — I-STAT CHEM 8, ED
BUN: 21 mg/dL — AB (ref 6–20)
CALCIUM ION: 1.08 mmol/L — AB (ref 1.15–1.40)
CHLORIDE: 100 mmol/L — AB (ref 101–111)
Creatinine, Ser: 1 mg/dL (ref 0.44–1.00)
GLUCOSE: 373 mg/dL — AB (ref 65–99)
HCT: 39 % (ref 36.0–46.0)
Hemoglobin: 13.3 g/dL (ref 12.0–15.0)
POTASSIUM: 3.4 mmol/L — AB (ref 3.5–5.1)
Sodium: 134 mmol/L — ABNORMAL LOW (ref 135–145)
TCO2: 27 mmol/L (ref 0–100)

## 2016-11-21 MED ORDER — FAMOTIDINE IN NACL 20-0.9 MG/50ML-% IV SOLN
20.0000 mg | Freq: Once | INTRAVENOUS | Status: AC
  Administered 2016-11-21: 20 mg via INTRAVENOUS
  Filled 2016-11-21: qty 50

## 2016-11-21 MED ORDER — PREDNISONE 10 MG PO TABS: 40.0000 mg | ORAL_TABLET | Freq: Every day | ORAL | 0 refills | Status: AC

## 2016-11-21 MED ORDER — LORAZEPAM 1 MG PO TABS: 0.0000 mg | ORAL_TABLET | Freq: Two times a day (BID) | ORAL | Status: DC

## 2016-11-21 MED ORDER — METHYLPREDNISOLONE SODIUM SUCC 125 MG IJ SOLR
125.0000 mg | Freq: Once | INTRAMUSCULAR | Status: AC
  Administered 2016-11-21: 125 mg via INTRAVENOUS
  Filled 2016-11-21: qty 2

## 2016-11-21 MED ORDER — LORAZEPAM 1 MG PO TABS: 0.0000 mg | ORAL_TABLET | Freq: Four times a day (QID) | ORAL | Status: DC

## 2016-11-21 MED ORDER — EPINEPHRINE 0.3 MG/0.3ML IJ SOAJ: 0.3000 mg | Freq: Once | INTRAMUSCULAR | 2 refills | Status: AC

## 2016-11-21 MED ORDER — SODIUM CHLORIDE 0.9 % IV BOLUS (SEPSIS)
1000.0000 mL | Freq: Once | INTRAVENOUS | Status: AC
  Administered 2016-11-21: 1000 mL via INTRAVENOUS

## 2016-11-21 MED ORDER — EPINEPHRINE 0.3 MG/0.3ML IJ SOAJ
INTRAMUSCULAR | Status: AC
  Administered 2016-11-21: 0.3 mg
  Filled 2016-11-21: qty 0.3

## 2016-11-21 MED ORDER — DIPHENHYDRAMINE HCL 50 MG/ML IJ SOLN
25.0000 mg | Freq: Once | INTRAMUSCULAR | Status: AC
  Administered 2016-11-21: 25 mg via INTRAVENOUS
  Filled 2016-11-21: qty 1

## 2016-11-21 NOTE — ED Notes (Signed)
Signing pad unavailable for pt to sign at this time. Pt verbalized understanding of discharge instruction and prescriptions. No questions at this time.

## 2016-11-21 NOTE — Discharge Instructions (Signed)
Take Benadryl 25 mg every 6 hours for 48 hours, ranitidine 150mg  once daily, and prednisone once daily as prescribed. Pick up your epi-pen rx immediately.

## 2016-11-21 NOTE — ED Provider Notes (Signed)
[facility] DEPT Provider Note   CSN: [id number] Arrival date & time: 11/21/16  1308     History   Chief Complaint Chief Complaint  Patient presents with  . Allergic Reaction    HPI Courtney Olson is a 51 y.o. female.  HPI   Presents with concern for dyspnea, sensation of throat closing, tongue swelling, rash, nausea, vomiting and diarrhea.  Has history of multiple episodes of anaphylaxis in the past, 40 episodes per pt, for which she has seen an allergist but does not have known trigger with exception of orange juice.  Was eating at a restaurant today, initially smelt something fishy and began to feel nauseas, then had bite of friends honey chicken and developed symptoms above.  Symptoms began around 1245. Had 8 episodes of diarrhea, vomited, had pruritic rash.  Reports today's episode was worse than prior, never has had symptoms of throat closing/tongue swelling before.  Took 50mg  benadryl PTA.  Past Medical History:  Diagnosis Date  . Aortic calcification (HCC)   . Atrophic vaginitis 01/2006  . Bradycardia 05/2014  . Chronic pancolonic ulcerative colitis ([facility]) 2015   unknown if chronic.  Marland Kitchen Cytomegaloviral hepatitis ([facility]) 2005  . Diabetes mellitus without complication ([facility]) 09/5571   on Ace inhibitor to protect kidneys  . Gallstones 01/2013  . GERD (gastroesophageal reflux disease)   . Hyperlipidemia   . Hypertension   . Kidney tumor 03/2013   Right  . Obesity   . OSA (obstructive sleep apnea)   . PAF (paroxysmal atrial fibrillation) ([facility])   . Pancreatitis 2014  . Postmenopausal   . RA (rheumatoid arthritis) ([facility])   . Syncope and collapse   . Ulcerative colitis ([facility]) 2015    Patient Active Problem List   Diagnosis Date Noted  . Aortic calcification ([facility]) 06/14/2014  . Renal mass 06/14/2014  . PAF (paroxysmal atrial fibrillation) ([facility])   . Vasovagal syncope 04/01/2014  . Hyperlipidemia   . Hypertension   . OSA (obstructive sleep apnea)   . RA (rheumatoid arthritis)  ([facility])   . GERD (gastroesophageal reflux disease)   . Cytomegaloviral hepatitis ([facility])   . Atrophic vaginitis 01/13/2006  . Diabetes mellitus without complication ([facility]) 22/08/5425    Past Surgical History:  Procedure Laterality Date  . CESAREAN SECTION    . CHOLECYSTECTOMY  01/2013  . COLONOSCOPY  2015   Colitis  . DILATION AND CURETTAGE OF UTERUS  2002  . HERNIA REPAIR  01/2013  . Kidney and Bladder Stent and tube placed Right 03/2013  . PARTIAL NEPHRECTOMY Right 02/2013   Tumor Removal   . Procedure to remove kidney and bladder stent and tube Right 04/2013  . TOTAL ABDOMINAL HYSTERECTOMY W/ BILATERAL SALPINGOOPHORECTOMY  2002   Secondary to AUB    OB History    Gravida Para Term Preterm AB Living   3 3 3  0 0 3   SAB TAB Ectopic Multiple Live Births   0 0 0 0 3       Home Medications    Prior to Admission medications   Medication Sig Start Date End Date Taking? Authorizing Provider  atorvastatin (LIPITOR) 20 MG tablet Take 1 tablet (20 mg total) by mouth daily. 05/29/16  Yes Dunn, Dayna N, PA-C  glipiZIDE (GLUCOTROL XL) 5 MG 24 hr tablet Take 1 tablet by mouth daily. 12/20/15  Yes [provider]  hydrochlorothiazide (MICROZIDE) 12.5 MG capsule Take 1 capsule (12.5 mg total) by mouth daily. 05/29/16  Yes Dunn, Dayna N, PA-C  hydrOXYzine (  ATARAX/VISTARIL) 25 MG tablet Take 25 mg by mouth every 6 (six) hours as needed (as directed). Reported on 12/27/2015 01/20/14  Yes Dammen, Collier Salina, PA-C  liraglutide (VICTOZA) 18 MG/3ML SOPN Inject 1.8 mg into the skin daily.   Yes [provider]  mesalamine (LIALDA) 1.2 G EC tablet Take 1.2 g by mouth daily. TAKE 4 TABS DAILY   Yes [provider]  metoprolol succinate (TOPROL-XL) 25 MG 24 hr tablet Take 0.5 tablets (12.5 mg total) by mouth every evening. 05/29/16  Yes Dunn, Dayna N, PA-C  omeprazole (PRILOSEC) 40 MG capsule Take 40 mg by mouth daily. 08/29/15  Yes [provider]  ramipril (ALTACE) 2.5 MG capsule  Take 1 capsule (2.5 mg total) by mouth daily. 05/29/16  Yes Dunn, Dayna N, PA-C  rivaroxaban (XARELTO) 20 MG TABS tablet TAKE 1 TABLET (20 MG TOTAL) BY MOUTH DAILY. 05/29/16  Yes Dunn, Dayna N, PA-C  Dulaglutide (TRULICITY) 6.29 BM/8.4XL SOPN Inject into the skin once a week. As directed    [provider]  EPINEPHrine (EPIPEN 2-PAK) 0.3 mg/0.3 mL IJ SOAJ injection Inject 0.3 mLs (0.3 mg total) into the muscle once. 11/21/16 11/21/16  Gareth Morgan, MD  glucose blood (ONE TOUCH ULTRA TEST) test strip Reported on 12/27/2015 01/10/13   [provider]  [facility] DELICA LANCETS 24M MISC by Does not apply route.    [provider]  predniSONE (DELTASONE) 10 MG tablet Take 4 tablets (40 mg total) by mouth daily. 11/21/16 11/24/16  Gareth Morgan, MD    Family History Family History  Problem Relation Age of Onset  . Diabetes Mother   . Hypertension Mother   . Fibromyalgia Mother   . Hypertension Father   . Heart disease Father     CABG  . Diabetes Father   . Hypertension Sister   . Diabetes Paternal Aunt   . Cancer Paternal Aunt     bone  . Cancer Maternal Grandmother     ovarian  . Diabetes Maternal Grandmother     Social History Social History  Substance Use Topics  . Smoking status: Never Smoker  . Smokeless tobacco: Never Used  . Alcohol use Yes     Comment: 2 a month     Allergies   Orange fruit [citrus]; Bactrim [sulfamethoxazole-trimethoprim]; and Tape   Review of Systems Review of Systems  Constitutional: Positive for fatigue. Negative for fever.  HENT: Negative for sore throat.   Eyes: Negative for visual disturbance.  Respiratory: Positive for shortness of breath and wheezing. Negative for cough.   Cardiovascular: Negative for chest pain.  Gastrointestinal: Positive for abdominal pain, diarrhea, nausea and vomiting.  Genitourinary: Negative for difficulty urinating.  Musculoskeletal: Negative for back pain and neck pain.  Skin: Positive  for rash.  Neurological: Positive for light-headedness. Negative for syncope and headaches.     Physical Exam Updated Vital Signs BP 120/83 (BP Location: Right Arm)   Pulse 77   Temp 98.3 F (36.8 C) (Oral)   Resp 18   LMP 07/16/2000   SpO2 96%   Physical Exam  Constitutional: She is oriented to person, place, and time. She appears well-developed and well-nourished. No distress.  HENT:  Head: Normocephalic and atraumatic.  Tongue swelling  Eyes: Conjunctivae and EOM are normal.  Neck: Normal range of motion.  Cardiovascular: Normal rate, regular rhythm, normal heart sounds and intact distal pulses.  Exam reveals no gallop and no friction rub.   No murmur heard. Pulmonary/Chest: Effort normal and breath  sounds normal. No respiratory distress. She has no wheezes. She has no rales.  No stridor  Abdominal: Soft. She exhibits no distension. There is tenderness. There is no guarding.  Musculoskeletal: She exhibits no edema or tenderness.  Neurological: She is alert and oriented to person, place, and time.  Skin: Skin is warm and dry. No rash noted. She is not diaphoretic. No erythema.  Nursing note and vitals reviewed.    ED Treatments / Results  Labs (all labs ordered are listed, but only abnormal results are displayed) Labs Reviewed  I-STAT CHEM 8, ED - Abnormal; Notable for the following:       Result Value   Sodium 134 (*)    Potassium 3.4 (*)    Chloride 100 (*)    BUN 21 (*)    Glucose, Bld 373 (*)    Calcium, Ion 1.08 (*)    All other components within normal limits    EKG  EKG Interpretation  Date/Time:  Wednesday Nov 21 2016 13:33:14 EDT Ventricular Rate:  63 PR Interval:    QRS Duration: 96 QT Interval:  427 QTC Calculation: 438 R Axis:   77 Text Interpretation:  Sinus rhythm Low voltage, precordial leads Baseline wander in lead(s) V2 V6 No previous ECGs available Confirmed by [facility] MD, Junie Panning ([id number]) on 11/21/2016 10:21:31 PM       Radiology No  results found.  Procedures Procedures (including critical care time)  Medications Ordered in ED Medications  EPINEPHrine (EPI-PEN) 0.3 mg/0.3 mL injection (0.3 mg  Given 11/21/16 1337)  methylPREDNISolone sodium succinate (SOLU-MEDROL) 125 mg/2 mL injection 125 mg (125 mg Intravenous Given 11/21/16 1337)  diphenhydrAMINE (BENADRYL) injection 25 mg (25 mg Intravenous Given 11/21/16 1336)  famotidine (PEPCID) IVPB 20 mg premix (0 mg Intravenous Stopped 11/21/16 1409)  sodium chloride 0.9 % bolus 1,000 mL (0 mLs Intravenous Stopped 11/21/16 1446)   CRITICAL CARE: anaphylaxis Performed by: Alvino Chapel   Total critical care time: 30 minutes  Critical care time was exclusive of separately billable procedures and treating other patients.  Critical care was necessary to treat or prevent imminent or life-threatening deterioration.  Critical care was time spent personally by me on the following activities: development of treatment plan with patient and/or surrogate as well as nursing, discussions with consultants, evaluation of patient's response to treatment, examination of patient, obtaining history from patient or surrogate, ordering and performing treatments and interventions, ordering and review of laboratory studies, ordering and review of radiographic studies, pulse oximetry and re-evaluation of patient's condition.   Initial Impression / Assessment and Plan / ED Course  I have reviewed the triage vital signs and the nursing notes.  Pertinent labs & imaging results that were available during my care of the patient were reviewed by me and considered in my medical decision making (see chart for details).     51yo female with history of ulcerative colitis, CMV hepatitis, DM, hypertension, hyperlipidemia, paroxysmal atrial fibrillation,  rheumatoid arthritis, history of multiple episodes of anaphylaxis in the past presents with concern for acute onset dyspnea, sensation of throat  closing, tongue swelling, rash, nausea, vomiting and diarrhea.  Symptoms consistent with anaphylaxis. Given .3mg  IM epinephrine, 25mg  IV benadryl, famotidine, and 125mg  solumedrol with improvement in symptoms. No sign of continuing anaphylaxis. Recommended 4 hours of observation, however patient requests early discharge with understanding of risks. Given rx for epipen, prednisone with discussion of risks, rec benadryl and zantac.  Final Clinical Impressions(s) / ED Diagnoses   Final diagnoses:  Anaphylaxis, initial encounter    New Prescriptions Discharge Medication List as of 11/21/2016  3:43 PM    START taking these medications   Details  predniSONE (DELTASONE) 10 MG tablet Take 4 tablets (40 mg total) by mouth daily., Starting Wed 11/21/2016, Until Sat 11/24/2016, Print         Gareth Morgan, MD 11/21/16 2250

## 2016-12-31 ENCOUNTER — Other Ambulatory Visit: Payer: Self-pay | Admitting: Nurse Practitioner

## 2016-12-31 ENCOUNTER — Ambulatory Visit (INDEPENDENT_AMBULATORY_CARE_PROVIDER_SITE_OTHER): Payer: BLUE CROSS/BLUE SHIELD | Admitting: Nurse Practitioner

## 2016-12-31 ENCOUNTER — Encounter: Payer: Self-pay | Admitting: Nurse Practitioner

## 2016-12-31 VITALS — BP 110/64 | HR 64 | Ht 64.75 in | Wt 202.0 lb

## 2016-12-31 DIAGNOSIS — Z01411 Encounter for gynecological examination (general) (routine) with abnormal findings: Secondary | ICD-10-CM | POA: Diagnosis not present

## 2016-12-31 DIAGNOSIS — I4891 Unspecified atrial fibrillation: Secondary | ICD-10-CM | POA: Diagnosis not present

## 2016-12-31 DIAGNOSIS — R3915 Urgency of urination: Secondary | ICD-10-CM | POA: Diagnosis not present

## 2016-12-31 DIAGNOSIS — N760 Acute vaginitis: Secondary | ICD-10-CM

## 2016-12-31 DIAGNOSIS — N952 Postmenopausal atrophic vaginitis: Secondary | ICD-10-CM | POA: Diagnosis not present

## 2016-12-31 LAB — POCT URINALYSIS DIPSTICK
Bilirubin, UA: NEGATIVE
Glucose, UA: 1000
Ketones, UA: NEGATIVE
Leukocytes, UA: NEGATIVE
Nitrite, UA: NEGATIVE
PROTEIN UA: NEGATIVE
RBC UA: NEGATIVE
UROBILINOGEN UA: 0.2 U/dL
pH, UA: 5.5 (ref 5.0–8.0)

## 2016-12-31 MED ORDER — ESTRADIOL 10 MCG VA TABS: ORAL_TABLET | VAGINAL | 12 refills | Status: DC

## 2016-12-31 NOTE — Progress Notes (Signed)
Reviewed personally.  M. Suzanne Leeann Bady, MD.  

## 2016-12-31 NOTE — Patient Instructions (Signed)

## 2016-12-31 NOTE — Progress Notes (Signed)
51 y.o. G74P3003 Married  Caucasian Fe here for annual exam.  Most recent various anaphylaxis of unknown etiology.  Now past 6 months has had 6 major issues.  She has been having more problems with vaginal dryness.  Returned from vacation with more itching around the vulva.  Urinary urgency also - but know that her HGB AIC is elevated.  Patient's last menstrual period was 07/16/2000 (approximate).          Sexually active: Yes.    The current method of family planning is status post hysterectomy.    Exercising: Yes.    walking ([employer]) Smoker:  no  Health Maintenance: Pap: 07/12/14 Negative (hysterectomy)  2003, Negative History of Abnormal Pap: no MMG: 12/26/15, 3D, BIRADS1, Density A, [facility] Self Breast exams: yes Colonoscopy: 2015 Ulcerative Colitis. Repeat in ? 5 yrs BMD:  10/17/2006, T Score: 0.8 Spine / 0.6 Left Femur Neck TDaP:  10/2006 - declines for now Pneumonia:2007 ? HIV: 2005 Labs: PCP   UA: negative except glucose   reports that she has never smoked. She has never used smokeless tobacco. She reports that she drinks alcohol. She reports that she does not use drugs.  Past Medical History:  Diagnosis Date  . Aortic calcification (HCC)   . Atrophic vaginitis 01/2006  . Bradycardia 05/2014  . Chronic pancolonic ulcerative colitis ([place]) 2015   unknown if chronic.  Marland Kitchen Cytomegaloviral hepatitis ([place]) 2005  . Diabetes mellitus without complication ([place]) 02/1447   on Ace inhibitor to protect kidneys  . Gallstones 01/2013  . GERD (gastroesophageal reflux disease)   . Hyperlipidemia   . Hypertension   . Kidney tumor 03/2013   Right  . Obesity   . OSA (obstructive sleep apnea)   . PAF (paroxysmal atrial fibrillation) ([place])   . Pancreatitis 2014  . Postmenopausal   . RA (rheumatoid arthritis) ([place])   . Syncope and collapse   . Ulcerative colitis ([place]) 2015    Past Surgical History:  Procedure Laterality Date  . CESAREAN SECTION    . CHOLECYSTECTOMY  01/2013  .  COLONOSCOPY  2015   Colitis  . DILATION AND CURETTAGE OF UTERUS  2002  . HERNIA REPAIR  01/2013  . Kidney and Bladder Stent and tube placed Right 03/2013  . PARTIAL NEPHRECTOMY Right 02/2013   Tumor Removal   . Procedure to remove kidney and bladder stent and tube Right 04/2013  . TOTAL ABDOMINAL HYSTERECTOMY W/ BILATERAL SALPINGOOPHORECTOMY  2002   Secondary to AUB    Current Outpatient Prescriptions  Medication Sig Dispense Refill  . ALPRAZolam (XANAX) 0.25 MG tablet Take 1 tablet by mouth 2 (two) times daily as needed.  0  . atorvastatin (LIPITOR) 20 MG tablet Take 1 tablet (20 mg total) by mouth daily. 90 tablet 3  . EPINEPHrine 0.3 mg/0.3 mL IJ SOAJ injection Inject 0.3 mLs into the skin as needed.  2  . famotidine (PEPCID) 20 MG tablet Take 20 mg by mouth daily.    Marland Kitchen glipiZIDE (GLUCOTROL XL) 10 MG 24 hr tablet Take 10 mg by mouth daily.  2  . hydrochlorothiazide (MICROZIDE) 12.5 MG capsule Take 1 capsule (12.5 mg total) by mouth daily. 90 capsule 3  . liraglutide (VICTOZA) 18 MG/3ML SOPN Inject 1.8 mg into the skin daily.    . mesalamine (LIALDA) 1.2 G EC tablet Take 1.2 g by mouth daily. TAKE 4 TABS DAILY    . metoprolol succinate (TOPROL-XL) 25 MG 24 hr tablet Take 0.5 tablets (12.5 mg  total) by mouth every evening. 90 tablet 3  . ramipril (ALTACE) 2.5 MG capsule Take 1 capsule (2.5 mg total) by mouth daily. 90 capsule 3  . rivaroxaban (XARELTO) 20 MG TABS tablet TAKE 1 TABLET (20 MG TOTAL) BY MOUTH DAILY. 90 tablet 3  . Dulaglutide (TRULICITY) 5.92 TW/4.4QK SOPN Inject into the skin once a week. As directed    . hydrOXYzine (ATARAX/VISTARIL) 25 MG tablet Take 25 mg by mouth every 6 (six) hours as needed (as directed). Reported on 12/27/2015     No current facility-administered medications for this visit.     Family History  Problem Relation Age of Onset  . Diabetes Mother   . Hypertension Mother   . Fibromyalgia Mother   . Hypertension Father   . Heart disease Father         CABG  . Diabetes Father   . Hypertension Sister   . Diabetes Paternal Aunt   . Cancer Paternal Aunt        bone  . Cancer Maternal Grandmother        ovarian  . Diabetes Maternal Grandmother     ROS:  Pertinent items are noted in HPI.  Otherwise, a comprehensive ROS was negative.  Exam:   BP 110/64 (BP Location: Right Arm, Patient Position: Sitting, Cuff Size: Large)   Pulse 64   Ht 5' 4.75" (1.645 m)   Wt 202 lb (91.6 kg)   LMP 07/16/2000 (Approximate)   BMI 33.87 kg/m  Height: 5' 4.75" (164.5 cm) Ht Readings from Last 3 Encounters:  12/31/16 5' 4.75" (1.645 m)  05/29/16 5\' 4"  (1.626 m)  12/27/15 5' 4.75" (1.645 m)    General appearance: alert, cooperative and appears stated age Head: Normocephalic, without obvious abnormality, atraumatic Neck: no adenopathy, supple, symmetrical, trachea midline and thyroid normal to inspection and palpation Lungs: clear to auscultation bilaterally Breasts: normal appearance, no masses or tenderness Heart: regular rate and rhythm Abdomen: soft, non-tender; no masses,  no organomegaly Extremities: extremities normal, atraumatic, no cyanosis or edema Skin: Skin color, texture, turgor normal. No rashes or lesions Lymph nodes: Cervical, supraclavicular, and axillary nodes normal. No abnormal inguinal nodes palpated Neurologic: Grossly normal   Pelvic: External genitalia:  Pink with a linear scratch on the right .  No discharge              Urethra:  normal appearing urethra with no masses, tenderness or lesions              Bartholin's and Skene's: normal                 Vagina: atrophic appearing vagina with normal color and no discharge, no lesions.  Affirm is taken              Cervix: absent              Pap taken: No. Bimanual Exam:  Uterus:  uterus absent              Adnexa: no mass, fullness, tenderness               Rectovaginal: Confirms               Anus:  normal sphincter tone, no lesions  Chaperone present: yes  A:   Well Woman with normal exam  S/P TAH/BSO secondary to AUB 2002  History of DM, HTN, idiopathic edema  Atrophic vaginitis - unable to take vagina estrogen due to A Fib  R/O yeast vaginitis  History of recent anaphylaxis of unknown cause.   P:   Reviewed health and wellness pertinent to exam  Pap smear: no  Mammogram is due 6/18  She is unable to take vaginal estrogen due to A fib  Continue with OTC hydrocortisone and Monistat until Affirm test is back.  She may use OTC Replens prn.  Counseled on breast self exam, mammography screening, adequate intake of calcium and vitamin D, diet and exercise return annually or prn  An After Visit Summary was printed and given to the patient.

## 2017-01-01 LAB — VAGINITIS/VAGINOSIS, DNA PROBE
CANDIDA SPECIES: NEGATIVE
GARDNERELLA VAGINALIS: POSITIVE — AB
TRICHOMONAS VAG: NEGATIVE

## 2017-01-04 ENCOUNTER — Telehealth: Payer: Self-pay | Admitting: Nurse Practitioner

## 2017-01-04 ENCOUNTER — Telehealth: Payer: Self-pay

## 2017-01-04 LAB — VAGINITIS/VAGINOSIS, DNA PROBE
Candida Species: NEGATIVE
GARDNERELLA VAGINALIS: POSITIVE — AB
TRICHOMONAS VAG: NEGATIVE

## 2017-01-04 MED ORDER — METRONIDAZOLE 0.75 % VA GEL: 1.0000 | Freq: Every day | VAGINAL | 0 refills | Status: DC

## 2017-01-04 NOTE — Telephone Encounter (Signed)
Spoke with patient. Advised of results as seen below from [place]. Patient verbalizes understanding and would like to use metrogel. Rx for Metrogel place 1 applicator vaginally qhs for 5 nights sent to pharmacy on file. Patient is agreeable and verbalizes understanding.  Routing to provider for final review. Patient agreeable to disposition. Will close encounter.

## 2017-01-04 NOTE — Telephone Encounter (Signed)
Received notification from [facility] that patient's BD Affirm from 12/31/16 has resulted and is positive for gardnerella and is negative for candida and trichomonas.. Please advise.

## 2017-01-04 NOTE — Telephone Encounter (Deleted)
-----   Message from Megan Salon, MD sent at 01/04/2017  1:59 PM EDT ----- Please let pt know her testing showed BV.  Ok to treat with Metrogel 2.76%, one applicator QHS x 5 nights OR flagyl 500mg  bid x 7 days.  If pt chooses oral medication, please advise no ETOH while on medication.  She needs to call back if symptoms do not fully resolve. additional follow up is needed.  CC:  Kem Boroughs

## 2017-01-04 NOTE — Telephone Encounter (Signed)
Left message to call Drew Lips at [phone]. 

## 2017-01-04 NOTE — Telephone Encounter (Signed)
-----   Message from Megan Salon, MD sent at 01/04/2017  1:59 PM EDT ----- Please let pt know her testing showed BV.  Ok to treat with Metrogel 3.88%, one applicator QHS x 5 nights OR flagyl 500mg  bid x 7 days.  If pt chooses oral medication, please advise no ETOH while on medication.  She needs to call back if symptoms do not fully resolve. additional follow up is needed.  CC:  Kem Boroughs

## 2017-01-04 NOTE — Telephone Encounter (Signed)
Pt has already been notified of results via triage and is being treated with metrogel.

## 2017-02-15 ENCOUNTER — Telehealth: Payer: Self-pay | Admitting: Obstetrics and Gynecology

## 2017-02-15 NOTE — Telephone Encounter (Signed)
Left message for patient to call to reschedule Courtney Olson appointment. °

## 2017-05-30 ENCOUNTER — Telehealth: Payer: Self-pay | Admitting: *Deleted

## 2017-05-30 ENCOUNTER — Other Ambulatory Visit: Payer: Self-pay | Admitting: Family Medicine

## 2017-05-30 ENCOUNTER — Other Ambulatory Visit: Payer: Self-pay | Admitting: Obstetrics & Gynecology

## 2017-05-30 DIAGNOSIS — Z139 Encounter for screening, unspecified: Secondary | ICD-10-CM

## 2017-05-30 NOTE — Telephone Encounter (Signed)
Detailed message left per DPR for patient to return call to follow up on refill request from pharmacy.

## 2017-06-28 ENCOUNTER — Ambulatory Visit
Admission: RE | Admit: 2017-06-28 | Discharge: 2017-06-28 | Disposition: A | Discharge status: Home / Self Care | Payer: BLUE CROSS/BLUE SHIELD | Source: Ambulatory Visit | Attending: Family Medicine | Admitting: Family Medicine

## 2017-06-28 ENCOUNTER — Other Ambulatory Visit: Payer: Self-pay | Admitting: Physician Assistant

## 2017-06-28 DIAGNOSIS — I480 Paroxysmal atrial fibrillation: Secondary | ICD-10-CM

## 2017-06-28 DIAGNOSIS — Z139 Encounter for screening, unspecified: Secondary | ICD-10-CM

## 2017-06-28 NOTE — Telephone Encounter (Signed)
Medication Detail    Disp Refills Start End   metoprolol succinate (TOPROL-XL) 25 MG 24 hr tablet 90 tablet 3 05/29/2016    Sig - Route: Take 0.5 tablets (12.5 mg total) by mouth every evening. - Oral   Sent to pharmacy as: metoprolol succinate (TOPROL-XL) 25 MG 24 hr tablet   E-Prescribing Status: Receipt confirmed by pharmacy (05/29/2016 6:30 PM EST)   Associated Diagnoses   PAF (paroxysmal atrial fibrillation) ([place]) - Primary     Pharmacy   [id number] - [place], [place] - [address]

## 2017-07-10 ENCOUNTER — Other Ambulatory Visit: Payer: Self-pay | Admitting: Physician Assistant

## 2017-07-10 DIAGNOSIS — I480 Paroxysmal atrial fibrillation: Secondary | ICD-10-CM

## 2017-08-06 ENCOUNTER — Other Ambulatory Visit: Payer: Self-pay

## 2017-08-06 DIAGNOSIS — I480 Paroxysmal atrial fibrillation: Secondary | ICD-10-CM

## 2017-08-06 MED ORDER — METOPROLOL SUCCINATE ER 25 MG PO TB24: 12.5000 mg | ORAL_TABLET | Freq: Every evening | ORAL | 0 refills | Status: DC

## 2017-08-07 ENCOUNTER — Other Ambulatory Visit: Payer: Self-pay

## 2017-08-07 DIAGNOSIS — I480 Paroxysmal atrial fibrillation: Secondary | ICD-10-CM

## 2017-08-07 MED ORDER — METOPROLOL SUCCINATE ER 25 MG PO TB24: 12.5000 mg | ORAL_TABLET | Freq: Every evening | ORAL | 0 refills | Status: DC

## 2017-08-11 DIAGNOSIS — M545 Low back pain: Secondary | ICD-10-CM | POA: Diagnosis not present

## 2017-08-14 DIAGNOSIS — M545 Low back pain: Secondary | ICD-10-CM | POA: Diagnosis not present

## 2017-08-14 DIAGNOSIS — Z794 Long term (current) use of insulin: Secondary | ICD-10-CM | POA: Diagnosis not present

## 2017-08-14 DIAGNOSIS — E1165 Type 2 diabetes mellitus with hyperglycemia: Secondary | ICD-10-CM | POA: Diagnosis not present

## 2017-08-22 NOTE — Telephone Encounter (Signed)
Xarelto 20mg  refill request received, pt is overdue for Cardiology appt & has been notified regarding scheduling an appt with Cardiologist. She was last seen by Melina Copa PA on 05/29/2016, Crea-1.00 on 11/21/16, Wt-91.6kg, CrCl-96.44ml/min. Mulitple attempts made to reach the pt via phone. Called the pt this am & she stated that her PCP has filled the Xarelto. Advised pt that she needs an appt & she states she will call later. Pt is aware that the request will be denied at this time.

## 2017-09-26 DIAGNOSIS — Z794 Long term (current) use of insulin: Secondary | ICD-10-CM | POA: Diagnosis not present

## 2017-09-26 DIAGNOSIS — E1165 Type 2 diabetes mellitus with hyperglycemia: Secondary | ICD-10-CM | POA: Diagnosis not present

## 2017-09-26 DIAGNOSIS — M545 Low back pain: Secondary | ICD-10-CM | POA: Diagnosis not present

## 2017-11-15 ENCOUNTER — Other Ambulatory Visit: Payer: Self-pay

## 2017-11-15 ENCOUNTER — Emergency Department (HOSPITAL_COMMUNITY)
Admission: EM | Admit: 2017-11-15 | Discharge: 2017-11-15 | Disposition: A | Discharge status: Home / Self Care | Payer: 59 | Attending: Emergency Medicine | Admitting: Emergency Medicine

## 2017-11-15 ENCOUNTER — Encounter (HOSPITAL_COMMUNITY): Payer: Self-pay

## 2017-11-15 DIAGNOSIS — Z7901 Long term (current) use of anticoagulants: Secondary | ICD-10-CM | POA: Insufficient documentation

## 2017-11-15 DIAGNOSIS — I4891 Unspecified atrial fibrillation: Secondary | ICD-10-CM | POA: Insufficient documentation

## 2017-11-15 DIAGNOSIS — Z79899 Other long term (current) drug therapy: Secondary | ICD-10-CM | POA: Diagnosis not present

## 2017-11-15 DIAGNOSIS — Z7984 Long term (current) use of oral hypoglycemic drugs: Secondary | ICD-10-CM | POA: Diagnosis not present

## 2017-11-15 DIAGNOSIS — M069 Rheumatoid arthritis, unspecified: Secondary | ICD-10-CM | POA: Diagnosis not present

## 2017-11-15 DIAGNOSIS — E119 Type 2 diabetes mellitus without complications: Secondary | ICD-10-CM | POA: Diagnosis not present

## 2017-11-15 DIAGNOSIS — T7840XA Allergy, unspecified, initial encounter: Secondary | ICD-10-CM

## 2017-11-15 DIAGNOSIS — T782XXA Anaphylactic shock, unspecified, initial encounter: Secondary | ICD-10-CM | POA: Diagnosis not present

## 2017-11-15 DIAGNOSIS — I10 Essential (primary) hypertension: Secondary | ICD-10-CM | POA: Insufficient documentation

## 2017-11-15 MED ORDER — EPINEPHRINE 0.3 MG/0.3ML IJ SOAJ
INTRAMUSCULAR | Status: AC
  Administered 2017-11-15: 0.3 mg
  Filled 2017-11-15: qty 0.3

## 2017-11-15 MED ORDER — EPINEPHRINE 0.3 MG/0.3ML IJ SOAJ: 0.3000 mg | Freq: Once | INTRAMUSCULAR | 1 refills | Status: AC

## 2017-11-15 MED ORDER — DIPHENHYDRAMINE HCL 25 MG PO CAPS: 25.0000 mg | ORAL_CAPSULE | Freq: Once | ORAL | Status: DC

## 2017-11-15 MED ORDER — PREDNISONE 20 MG PO TABS
60.0000 mg | ORAL_TABLET | Freq: Once | ORAL | Status: AC
  Administered 2017-11-15: 60 mg via ORAL
  Filled 2017-11-15: qty 3

## 2017-11-15 MED ORDER — PREDNISONE 20 MG PO TABS: ORAL_TABLET | ORAL | 0 refills | Status: DC

## 2017-11-15 MED ORDER — FAMOTIDINE 20 MG PO TABS: 20.0000 mg | ORAL_TABLET | Freq: Once | ORAL | Status: DC

## 2017-11-15 NOTE — ED Triage Notes (Signed)
Patient presents with history of unknown etiology allergic reactions, including anaphylactic shock. Patient reports redness and itching to her skin and swelling to her tongue. Patient reports trying to use her Epipen, but it "misfired." Patient reports taking "4 childrens benedryl" before coming to the ER.

## 2017-11-15 NOTE — ED Notes (Signed)
Pt reports that she feels much better. She is declining an IV and will take the prednisone. She has declined the benadryl and pepcid. A&Ox4. Hives have improved and lips are no longer swollen.

## 2017-11-15 NOTE — ED Provider Notes (Signed)
[facility] DEPT Provider Note   CSN: [id number] Arrival date & time: 11/15/17  2005     History   Chief Complaint Chief Complaint  Patient presents with  . Allergic Reaction    HPI Courtney Olson is a 52 y.o. female.  HPI Patient reports she has a history of frequent and severe allergic reactions.  The cause is never been identified.  This is happened up to 40 times previously.  Patient reports that she started to get hives and redness and swelling of her skin.  She could feel a tightening in her central chest and back of her throat.  Patient has been epinephrine pens or these types of events.  She reports the pen misfired and discharged contents not into her leg.  She reports normally she has a second pen available but did not and thus came to the emergency department.  She did take some children's Benadryl that she had in her purse and had had Pepcid several hours earlier. Patient had been well prior to this.  She was going to a birthday party.  She reports she had eaten at the [employer].  She reports she has had multiple specialty evaluations and allergy testing for these allergic reactions.  She reports since always ended up being "etiology undetermined".  This is been occurring since 1992. Past Medical History:  Diagnosis Date  . Aortic calcification (HCC)   . Atrophic vaginitis 01/2006  . Bradycardia 05/2014  . Chronic pancolonic ulcerative colitis ([place]) 2015   unknown if chronic.  Marland Kitchen Cytomegaloviral hepatitis ([place]) 2005  . Diabetes mellitus without complication ([place]) 12/3783   on Ace inhibitor to protect kidneys  . Gallstones 01/2013  . GERD (gastroesophageal reflux disease)   . Hyperlipidemia   . Hypertension   . Kidney tumor 03/2013   Right  . Obesity   . OSA (obstructive sleep apnea)   . PAF (paroxysmal atrial fibrillation) ([place])   . Pancreatitis 2014  . Postmenopausal   . RA (rheumatoid arthritis) ([place])   . Syncope and collapse   .  Ulcerative colitis ([place]) 2015    Patient Active Problem List   Diagnosis Date Noted  . Aortic calcification ([place]) 06/14/2014  . Renal mass 06/14/2014  . PAF (paroxysmal atrial fibrillation) ([place])   . Vasovagal syncope 04/01/2014  . Hyperlipidemia   . Hypertension   . OSA (obstructive sleep apnea)   . RA (rheumatoid arthritis) ([place])   . GERD (gastroesophageal reflux disease)   . Cytomegaloviral hepatitis ([place])   . Atrophic vaginitis 01/13/2006  . Diabetes mellitus without complication ([place]) 88/50/2774    Past Surgical History:  Procedure Laterality Date  . CESAREAN SECTION    . CHOLECYSTECTOMY  01/2013  . COLONOSCOPY  2015   Colitis  . DILATION AND CURETTAGE OF UTERUS  2002  . HERNIA REPAIR  01/2013  . Kidney and Bladder Stent and tube placed Right 03/2013  . PARTIAL NEPHRECTOMY Right 02/2013   Tumor Removal   . Procedure to remove kidney and bladder stent and tube Right 04/2013  . TOTAL ABDOMINAL HYSTERECTOMY W/ BILATERAL SALPINGOOPHORECTOMY  2002   Secondary to AUB     OB History    Gravida  3   Para  3   Term  3   Preterm  0   AB  0   Living  3     SAB  0   TAB  0   Ectopic  0   Multiple  0  Live Births  3            Home Medications    Prior to Admission medications   Medication Sig Start Date End Date Taking? Authorizing Provider  ALPRAZolam Duanne Moron) 0.25 MG tablet Take 1 tablet by mouth 2 (two) times daily as needed. 12/18/16   [provider]  atorvastatin (LIPITOR) 20 MG tablet Take 1 tablet (20 mg total) by mouth daily. 05/29/16   Dunn, Nedra Hai, PA-C  Dulaglutide (TRULICITY) 1.01 BP/1.0CH SOPN Inject into the skin once a week. As directed    [provider]  EPINEPHrine (EPIPEN 2-PAK) 0.3 mg/0.3 mL IJ SOAJ injection Inject 0.3 mLs (0.3 mg total) into the muscle once for 1 dose. 11/15/17 11/15/17  Charlesetta Shanks, MD  EPINEPHrine 0.3 mg/0.3 mL IJ SOAJ injection Inject 0.3 mLs into the skin as needed. 12/16/16   [provider]  famotidine (PEPCID) 20 MG tablet Take 20 mg by mouth daily.    [provider]  glipiZIDE (GLUCOTROL XL) 10 MG 24 hr tablet Take 10 mg by mouth daily. 12/21/16   [provider]  hydrochlorothiazide (MICROZIDE) 12.5 MG capsule Take 1 capsule (12.5 mg total) by mouth daily. 05/29/16   Dunn, Nedra Hai, PA-C  hydrOXYzine (ATARAX/VISTARIL) 25 MG tablet Take 25 mg by mouth every 6 (six) hours as needed (as directed). Reported on 12/27/2015 01/20/14   Hazel Sams, PA-C  liraglutide (VICTOZA) 18 MG/3ML SOPN Inject 1.8 mg into the skin daily.    [provider]  mesalamine (LIALDA) 1.2 G EC tablet Take 1.2 g by mouth daily. TAKE 4 TABS DAILY    [provider]  metoprolol succinate (TOPROL-XL) 25 MG 24 hr tablet Take 0.5 tablets (12.5 mg total) by mouth every evening. Please schedule an appt for further refills  2nd atempt 08/07/17   Sueanne Margarita, MD  metroNIDAZOLE (METROGEL VAGINAL) 0.75 % vaginal gel Place 1 Applicatorful vaginally at bedtime. X 5 nights 01/04/17   Megan Salon, MD  predniSONE (DELTASONE) 20 MG tablet 2 tabs po daily x 3 days 11/15/17   Charlesetta Shanks, MD  ramipril (ALTACE) 2.5 MG capsule Take 1 capsule (2.5 mg total) by mouth daily. 05/29/16   Dunn, Nedra Hai, PA-C  rivaroxaban (XARELTO) 20 MG TABS tablet Take 1 tablet (20 mg total) by mouth daily. Please schedule yearly appt for anymore refills, thanks! [phone] 1st Attempt 06/28/17   Charlie Pitter, PA-C    Family History Family History  Problem Relation Age of Onset  . Diabetes Mother   . Hypertension Mother   . Fibromyalgia Mother   . Hypertension Father   . Heart disease Father        CABG  . Diabetes Father   . Hypertension Sister   . Diabetes Paternal Aunt   . Cancer Paternal Aunt        bone  . Cancer Maternal Grandmother        ovarian  . Diabetes Maternal Grandmother     Social History Social History   Tobacco Use  . Smoking status: Never Smoker  . Smokeless  tobacco: Never Used  Substance Use Topics  . Alcohol use: Yes    Comment: 2 a month  . Drug use: No     Allergies   Orange fruit [citrus]; Bactrim [sulfamethoxazole-trimethoprim]; and Tape   Review of Systems Review of Systems 10 Systems reviewed and are negative for acute change except as noted in the HPI.   Physical Exam Updated  Vital Signs BP (!) 147/80 (BP Location: Left Arm)   Pulse 85   Temp 98.5 F (36.9 C) (Oral)   Resp 16   Ht 5\' 5"  (1.651 m)   Wt 86.2 kg (190 lb)   LMP 07/16/2000 (Approximate)   SpO2 100%   BMI 31.62 kg/m   Physical Exam  Constitutional: She is oriented to person, place, and time.  Patient is alert and appropriate.  No respiratory distress.  She has diffuse erythematous full body rash.  HENT:  Head: Normocephalic and atraumatic.  No perioral swelling.  Posterior oropharynx widely patent.  No visible tongue swelling.  Eyes: EOM are normal.  Neck: Neck supple.  Cardiovascular: Normal rate, regular rhythm, normal heart sounds and intact distal pulses.  Pulmonary/Chest: Effort normal and breath sounds normal.  Abdominal: Soft. She exhibits no distension. There is no tenderness. There is no guarding.  Musculoskeletal: Normal range of motion. She exhibits no edema.  Neurological: She is alert and oriented to person, place, and time. No cranial nerve deficit. She exhibits normal muscle tone. Coordination normal.  Skin: Skin is warm and dry. No rash noted.  Patient has very diffuse nearly full body erythematous rash.  She has small hives visible on the volar aspects of the forearms  Psychiatric: She has a normal mood and affect.     ED Treatments / Results  Labs (all labs ordered are listed, but only abnormal results are displayed) Labs Reviewed - No data to display  EKG None  Radiology No results found.  Procedures Procedures (including critical care time)  Medications Ordered in ED Medications  diphenhydrAMINE (BENADRYL) capsule  25 mg (25 mg Oral Refused 11/15/17 2040)  famotidine (PEPCID) tablet 20 mg (20 mg Oral Refused 11/15/17 2040)  EPINEPHrine (EPI-PEN) 0.3 mg/0.3 mL injection (0.3 mg  Given 11/15/17 2011)  predniSONE (DELTASONE) tablet 60 mg (60 mg Oral Given 11/15/17 2029)     Initial Impression / Assessment and Plan / ED Course  I have reviewed the triage vital signs and the nursing notes.  Pertinent labs & imaging results that were available during my care of the patient were reviewed by me and considered in my medical decision making (see chart for details).      Final Clinical Impressions(s) / ED Diagnoses   Final diagnoses:  Anaphylaxis, initial encounter  Allergic reaction, initial encounter  Patient has similar episode multiple times in the past.  She had failure of her epinephrine pen and thus proceeded to the emergency department.  Epi-Pen administered.  Patient began to improve very quickly.  She did perceive tightness in her central chest and her throat but there was no visible oral swelling and normal voice.  No wheeze of the pulmonary feels.  Vital signs stable.  Patient did not wish for IV placement and IV medications.  She reports this is happened so many times previously that once it starts to improve from the epinephrine pen it resolves.  She does not wish to stay for prolonged observation in the emergency department.  Patient reports she has other plans at home and she will be given a refill prescription as well as a couple days of prednisone.  Patient is aware of signs and symptoms for which to return.  ED Discharge Orders        Ordered    EPINEPHrine (EPIPEN 2-PAK) 0.3 mg/0.3 mL IJ SOAJ injection   Once     11/15/17 2050    predniSONE (DELTASONE) 20 MG tablet  11/15/17 2050       Charlesetta Shanks, MD 11/15/17 2100

## 2017-11-15 NOTE — Discharge Instructions (Addendum)
1.  Follow-up with your family doctor or your allergist. 2.  Return to the emergency department if you have any recurrence of symptoms.

## 2017-11-21 DIAGNOSIS — L988 Other specified disorders of the skin and subcutaneous tissue: Secondary | ICD-10-CM | POA: Diagnosis not present

## 2017-11-21 DIAGNOSIS — L814 Other melanin hyperpigmentation: Secondary | ICD-10-CM | POA: Diagnosis not present

## 2017-11-21 DIAGNOSIS — D485 Neoplasm of uncertain behavior of skin: Secondary | ICD-10-CM | POA: Diagnosis not present

## 2017-11-21 DIAGNOSIS — D225 Melanocytic nevi of trunk: Secondary | ICD-10-CM | POA: Diagnosis not present

## 2017-11-21 DIAGNOSIS — C44729 Squamous cell carcinoma of skin of left lower limb, including hip: Secondary | ICD-10-CM | POA: Diagnosis not present

## 2017-12-24 DIAGNOSIS — D0472 Carcinoma in situ of skin of left lower limb, including hip: Secondary | ICD-10-CM | POA: Diagnosis not present

## 2017-12-24 DIAGNOSIS — L905 Scar conditions and fibrosis of skin: Secondary | ICD-10-CM | POA: Diagnosis not present

## 2018-01-03 ENCOUNTER — Ambulatory Visit: Payer: BLUE CROSS/BLUE SHIELD | Admitting: Nurse Practitioner

## 2018-04-11 ENCOUNTER — Telehealth: Payer: Self-pay | Admitting: Cardiology

## 2018-04-11 NOTE — Telephone Encounter (Signed)
Has AFib and wants to know if it is safe for her to travel to [place] due to altitude

## 2018-04-11 NOTE — Telephone Encounter (Addendum)
Pt instructed that it is okay to travel to [place] per Cecilie Kicks  NP  .Adonis Housekeeper

## 2018-05-04 ENCOUNTER — Emergency Department (HOSPITAL_COMMUNITY): Payer: 59

## 2018-05-04 ENCOUNTER — Encounter (HOSPITAL_COMMUNITY): Payer: Self-pay | Admitting: Emergency Medicine

## 2018-05-04 ENCOUNTER — Emergency Department (HOSPITAL_COMMUNITY)
Admission: EM | Admit: 2018-05-04 | Discharge: 2018-05-04 | Disposition: A | Discharge status: Home / Self Care | Payer: 59 | Attending: Emergency Medicine | Admitting: Emergency Medicine

## 2018-05-04 ENCOUNTER — Other Ambulatory Visit: Payer: Self-pay

## 2018-05-04 DIAGNOSIS — R110 Nausea: Secondary | ICD-10-CM | POA: Diagnosis not present

## 2018-05-04 DIAGNOSIS — Z794 Long term (current) use of insulin: Secondary | ICD-10-CM | POA: Diagnosis not present

## 2018-05-04 DIAGNOSIS — Z79899 Other long term (current) drug therapy: Secondary | ICD-10-CM | POA: Diagnosis not present

## 2018-05-04 DIAGNOSIS — Z7901 Long term (current) use of anticoagulants: Secondary | ICD-10-CM | POA: Insufficient documentation

## 2018-05-04 DIAGNOSIS — K5900 Constipation, unspecified: Secondary | ICD-10-CM | POA: Diagnosis not present

## 2018-05-04 DIAGNOSIS — K5792 Diverticulitis of intestine, part unspecified, without perforation or abscess without bleeding: Secondary | ICD-10-CM

## 2018-05-04 DIAGNOSIS — M069 Rheumatoid arthritis, unspecified: Secondary | ICD-10-CM | POA: Diagnosis not present

## 2018-05-04 DIAGNOSIS — E119 Type 2 diabetes mellitus without complications: Secondary | ICD-10-CM | POA: Diagnosis not present

## 2018-05-04 DIAGNOSIS — Z905 Acquired absence of kidney: Secondary | ICD-10-CM | POA: Diagnosis not present

## 2018-05-04 DIAGNOSIS — I10 Essential (primary) hypertension: Secondary | ICD-10-CM | POA: Diagnosis not present

## 2018-05-04 DIAGNOSIS — R1032 Left lower quadrant pain: Secondary | ICD-10-CM | POA: Diagnosis present

## 2018-05-04 LAB — CBC WITH DIFFERENTIAL/PLATELET
ABS IMMATURE GRANULOCYTES: 0.02 10*3/uL (ref 0.00–0.07)
BASOS ABS: 0 10*3/uL (ref 0.0–0.1)
Basophils Relative: 0 %
EOS PCT: 1 %
Eosinophils Absolute: 0.1 10*3/uL (ref 0.0–0.5)
HCT: 36.9 % (ref 36.0–46.0)
HEMOGLOBIN: 12.3 g/dL (ref 12.0–15.0)
IMMATURE GRANULOCYTES: 0 %
LYMPHS ABS: 1.5 10*3/uL (ref 0.7–4.0)
LYMPHS PCT: 18 %
MCH: 28.8 pg (ref 26.0–34.0)
MCHC: 33.3 g/dL (ref 30.0–36.0)
MCV: 86.4 fL (ref 80.0–100.0)
Monocytes Absolute: 0.5 10*3/uL (ref 0.1–1.0)
Monocytes Relative: 5 %
NEUTROS ABS: 6.6 10*3/uL (ref 1.7–7.7)
NEUTROS PCT: 76 %
NRBC: 0 % (ref 0.0–0.2)
Platelets: 163 10*3/uL (ref 150–400)
RBC: 4.27 MIL/uL (ref 3.87–5.11)
RDW: 14.6 % (ref 11.5–15.5)
WBC: 8.7 10*3/uL (ref 4.0–10.5)

## 2018-05-04 LAB — COMPREHENSIVE METABOLIC PANEL
ALBUMIN: 4 g/dL (ref 3.5–5.0)
ALK PHOS: 83 U/L (ref 38–126)
ALT: 34 U/L (ref 0–44)
ANION GAP: 10 (ref 5–15)
AST: 27 U/L (ref 15–41)
BUN: 15 mg/dL (ref 6–20)
CALCIUM: 9.2 mg/dL (ref 8.9–10.3)
CO2: 28 mmol/L (ref 22–32)
Chloride: 98 mmol/L (ref 98–111)
Creatinine, Ser: 0.96 mg/dL (ref 0.44–1.00)
GFR calc Af Amer: >60 mL/min (ref >60)
GFR calc non Af Amer: >60 mL/min (ref >60)
GLUCOSE: 286 mg/dL — AB (ref 70–99)
Potassium: 4 mmol/L (ref 3.5–5.1)
SODIUM: 136 mmol/L (ref 135–145)
Total Bilirubin: 1.4 mg/dL — ABNORMAL HIGH (ref 0.3–1.2)
Total Protein: 7.5 g/dL (ref 6.5–8.1)

## 2018-05-04 LAB — URINALYSIS, ROUTINE W REFLEX MICROSCOPIC
BILIRUBIN URINE: NEGATIVE
Glucose, UA: 150 mg/dL — AB
Hgb urine dipstick: NEGATIVE
Ketones, ur: NEGATIVE mg/dL
LEUKOCYTES UA: NEGATIVE
NITRITE: NEGATIVE
Protein, ur: NEGATIVE mg/dL
SPECIFIC GRAVITY, URINE: 1.016 (ref 1.005–1.030)
pH: 7 (ref 5.0–8.0)

## 2018-05-04 LAB — LIPASE, BLOOD: Lipase: 23 U/L (ref 11–51)

## 2018-05-04 LAB — I-STAT CG4 LACTIC ACID, ED: LACTIC ACID, VENOUS: 1.31 mmol/L (ref 0.5–1.9)

## 2018-05-04 MED ORDER — LACTATED RINGERS IV BOLUS
1000.0000 mL | Freq: Once | INTRAVENOUS | Status: AC
  Administered 2018-05-04: 1000 mL via INTRAVENOUS

## 2018-05-04 MED ORDER — FENTANYL CITRATE (PF) 100 MCG/2ML IJ SOLN
50.0000 ug | Freq: Once | INTRAMUSCULAR | Status: AC
  Administered 2018-05-04: 50 ug via INTRAVENOUS
  Filled 2018-05-04: qty 2

## 2018-05-04 MED ORDER — CIPROFLOXACIN HCL 500 MG PO TABS: 500.0000 mg | ORAL_TABLET | Freq: Two times a day (BID) | ORAL | 0 refills | Status: AC

## 2018-05-04 MED ORDER — METRONIDAZOLE 500 MG PO TABS: 500.0000 mg | ORAL_TABLET | Freq: Two times a day (BID) | ORAL | 0 refills | Status: AC

## 2018-05-04 MED ORDER — IOPAMIDOL (ISOVUE-300) INJECTION 61%
100.0000 mL | Freq: Once | INTRAVENOUS | Status: AC | PRN
  Administered 2018-05-04: 100 mL via INTRAVENOUS

## 2018-05-04 MED ORDER — IOPAMIDOL (ISOVUE-300) INJECTION 61%
INTRAVENOUS | Status: AC
  Filled 2018-05-04: qty 100

## 2018-05-04 MED ORDER — ONDANSETRON HCL 4 MG PO TABS: 4.0000 mg | ORAL_TABLET | Freq: Four times a day (QID) | ORAL | 0 refills | Status: AC

## 2018-05-04 MED ORDER — SODIUM CHLORIDE 0.9 % IJ SOLN
INTRAMUSCULAR | Status: AC
  Filled 2018-05-04: qty 50

## 2018-05-04 NOTE — ED Triage Notes (Signed)
Pt with LLQ pain, LLQ tender. Pt states she has been constipated x 5 days, pt with hx of ulcerative colitis.

## 2018-05-04 NOTE — ED Provider Notes (Signed)
[facility] DEPT Provider Note   CSN: [id number] Arrival date & time: 05/04/18  1023     History   Chief Complaint Chief Complaint  Patient presents with  . Abdominal Pain    HPI Courtney Olson is a 52 y.o. female.  The history is provided by the patient.  Abdominal Pain   This is a new problem. The current episode started 2 days ago. The problem occurs daily. The problem has been gradually worsening. The pain is associated with an unknown (hx of ulcerative colitis) factor. The pain is located in the LLQ. The quality of the pain is aching and dull. The pain is at a severity of 8/10. The pain is moderate. Associated symptoms include nausea and constipation. Pertinent negatives include anorexia, fever, belching, diarrhea, hematochezia, melena, vomiting, dysuria, frequency, hematuria, headaches, arthralgias and myalgias. The symptoms are aggravated by palpation. Nothing relieves the symptoms. Past workup includes GI consult. Her past medical history is significant for ulcerative colitis.    Past Medical History:  Diagnosis Date  . Aortic calcification (HCC)   . Atrophic vaginitis 01/2006  . Bradycardia 05/2014  . Chronic pancolonic ulcerative colitis ([place]) 2015   unknown if chronic.  Marland Kitchen Cytomegaloviral hepatitis ([place]) 2005  . Diabetes mellitus without complication ([place]) 01/5642   on Ace inhibitor to protect kidneys  . Gallstones 01/2013  . GERD (gastroesophageal reflux disease)   . Hyperlipidemia   . Hypertension   . Kidney tumor 03/2013   Right  . Obesity   . OSA (obstructive sleep apnea)   . PAF (paroxysmal atrial fibrillation) ([place])   . Pancreatitis 2014  . Postmenopausal   . RA (rheumatoid arthritis) ([place])   . Syncope and collapse   . Ulcerative colitis ([place]) 2015    Patient Active Problem List   Diagnosis Date Noted  . Aortic calcification ([place]) 06/14/2014  . Renal mass 06/14/2014  . PAF (paroxysmal atrial fibrillation) ([place])   .  Vasovagal syncope 04/01/2014  . Hyperlipidemia   . Hypertension   . OSA (obstructive sleep apnea)   . RA (rheumatoid arthritis) ([place])   . GERD (gastroesophageal reflux disease)   . Cytomegaloviral hepatitis ([place])   . Atrophic vaginitis 01/13/2006  . Diabetes mellitus without complication ([place]) 32/95/1884    Past Surgical History:  Procedure Laterality Date  . CESAREAN SECTION    . CHOLECYSTECTOMY  01/2013  . COLONOSCOPY  2015   Colitis  . DILATION AND CURETTAGE OF UTERUS  2002  . HERNIA REPAIR  01/2013  . Kidney and Bladder Stent and tube placed Right 03/2013  . PARTIAL NEPHRECTOMY Right 02/2013   Tumor Removal   . Procedure to remove kidney and bladder stent and tube Right 04/2013  . TOTAL ABDOMINAL HYSTERECTOMY W/ BILATERAL SALPINGOOPHORECTOMY  2002   Secondary to AUB     OB History    Gravida  3   Para  3   Term  3   Preterm  0   AB  0   Living  3     SAB  0   TAB  0   Ectopic  0   Multiple  0   Live Births  3            Home Medications    Prior to Admission medications   Medication Sig Start Date End Date Taking? Authorizing Provider  ALPRAZolam Duanne Moron) 0.25 MG tablet Take 1 tablet by mouth 2 (two) times daily as needed. 12/18/16  Yes [provider]  atorvastatin (LIPITOR) 20 MG tablet Take 1 tablet (20 mg total) by mouth daily. 05/29/16  Yes Dunn, Dayna N, PA-C  EPINEPHrine 0.3 mg/0.3 mL IJ SOAJ injection Inject 0.3 mLs into the skin as needed. 12/16/16  Yes [provider]  famotidine (PEPCID) 10 MG tablet Take 10 mg by mouth daily.   Yes [provider]  glipiZIDE (GLUCOTROL XL) 10 MG 24 hr tablet Take 10 mg by mouth daily. 12/21/16  Yes [provider]  hydrochlorothiazide (MICROZIDE) 12.5 MG capsule Take 1 capsule (12.5 mg total) by mouth daily. 05/29/16  Yes Dunn, Dayna N, PA-C  Insulin Glargine (BASAGLAR KWIKPEN) 100 UNIT/ML SOPN Inject 14 Units into the skin daily. 03/04/18  Yes [provider]    mesalamine (LIALDA) 1.2 G EC tablet Take 1.2 g by mouth daily.    Yes [provider]  metoprolol succinate (TOPROL-XL) 25 MG 24 hr tablet Take 0.5 tablets (12.5 mg total) by mouth every evening. Please schedule an appt for further refills  2nd atempt 08/07/17  Yes Turner, Traci R, MD  ramipril (ALTACE) 2.5 MG capsule Take 1 capsule (2.5 mg total) by mouth daily. 05/29/16  Yes Dunn, Dayna N, PA-C  rivaroxaban (XARELTO) 20 MG TABS tablet Take 1 tablet (20 mg total) by mouth daily. Please schedule yearly appt for anymore refills, thanks! [phone] 1st Attempt 06/28/17  Yes Dunn, Dayna N, PA-C  TRULICITY 1.5 XH/3.7JI SOPN Inject 1.5 mg into the skin every 7 (seven) days.  02/23/18  Yes [provider]  ciprofloxacin (CIPRO) 500 MG tablet Take 1 tablet (500 mg total) by mouth 2 (two) times daily for 10 days. 05/04/18 05/14/18  Cylee Dattilo, DO  metroNIDAZOLE (FLAGYL) 500 MG tablet Take 1 tablet (500 mg total) by mouth 2 (two) times daily for 10 days. 05/04/18 05/14/18  [place] Perry, DO  metroNIDAZOLE (METROGEL VAGINAL) 0.75 % vaginal gel Place 1 Applicatorful vaginally at bedtime. X 5 nights Patient not taking: Reported on 05/04/2018 01/04/17   Megan Salon, MD  ondansetron (ZOFRAN) 4 MG tablet Take 1 tablet (4 mg total) by mouth every 6 (six) hours for 12 doses. 05/04/18 05/07/18  Loraine Freid, DO  predniSONE (DELTASONE) 20 MG tablet 2 tabs po daily x 3 days Patient not taking: Reported on 05/04/2018 11/15/17   Charlesetta Shanks, MD    Family History Family History  Problem Relation Age of Onset  . Diabetes Mother   . Hypertension Mother   . Fibromyalgia Mother   . Hypertension Father   . Heart disease Father        CABG  . Diabetes Father   . Hypertension Sister   . Diabetes Paternal Aunt   . Cancer Paternal Aunt        bone  . Cancer Maternal Grandmother        ovarian  . Diabetes Maternal Grandmother     Social History Social History   Tobacco Use  .  Smoking status: Never Smoker  . Smokeless tobacco: Never Used  Substance Use Topics  . Alcohol use: Yes    Comment: 2 a month  . Drug use: No     Allergies   Orange fruit [citrus]; Bactrim [sulfamethoxazole-trimethoprim]; and Tape   Review of Systems Review of Systems  Constitutional: Negative for chills and fever.  HENT: Negative for ear pain and sore throat.   Eyes: Negative for pain and visual disturbance.  Respiratory: Negative for cough and shortness of breath.   Cardiovascular: Negative for chest pain and  palpitations.  Gastrointestinal: Positive for abdominal pain, constipation and nausea. Negative for anorexia, diarrhea, hematochezia, melena and vomiting.  Genitourinary: Negative for dysuria, frequency and hematuria.  Musculoskeletal: Negative for arthralgias, back pain and myalgias.  Skin: Negative for color change and rash.  Neurological: Negative for seizures, syncope and headaches.  All other systems reviewed and are negative.    Physical Exam Updated Vital Signs  ED Triage Vitals  Enc Vitals Group     BP 05/04/18 1030 (!) 152/106     Pulse Rate 05/04/18 1030 98     Resp 05/04/18 1030 18     Temp 05/04/18 1030 100 F (37.8 C)     Temp Source 05/04/18 1030 Oral     SpO2 05/04/18 1030 100 %     Weight 05/04/18 1030 205 lb (93 kg)     Height 05/04/18 1030 5\' 5"  (1.651 m)     Head Circumference --      Peak Flow --      Pain Score 05/04/18 1034 10     Pain Loc --      Pain Edu? --      Excl. in [place]? --     Physical Exam  Constitutional: She appears well-developed and well-nourished. No distress.  HENT:  Head: Normocephalic and atraumatic.  Eyes: Pupils are equal, round, and reactive to light. Conjunctivae and EOM are normal.  Neck: Neck supple.  Cardiovascular: Normal rate, regular rhythm, normal heart sounds and intact distal pulses.  No murmur heard. Pulmonary/Chest: Effort normal and breath sounds normal. No respiratory distress.  Abdominal: Soft.  Normal appearance. There is tenderness in the left lower quadrant. There is guarding. There is no rigidity, no rebound and no CVA tenderness.  Musculoskeletal: She exhibits no edema.  Neurological: She is alert.  Skin: Skin is warm and dry. Capillary refill takes less than 2 seconds.  Psychiatric: She has a normal mood and affect.  Nursing note and vitals reviewed.    ED Treatments / Results  Labs (all labs ordered are listed, but only abnormal results are displayed) Labs Reviewed  COMPREHENSIVE METABOLIC PANEL - Abnormal; Notable for the following components:      Result Value   Glucose, Bld 286 (*)    Total Bilirubin 1.4 (*)    All other components within normal limits  URINALYSIS, ROUTINE W REFLEX MICROSCOPIC - Abnormal; Notable for the following components:   Color, Urine STRAW (*)    Glucose, UA 150 (*)    All other components within normal limits  LIPASE, BLOOD  CBC WITH DIFFERENTIAL/PLATELET  I-STAT CG4 LACTIC ACID, ED    EKG None  Radiology Ct Abdomen Pelvis W Contrast  Result Date: 05/04/2018 CLINICAL DATA:  Left lower quadrant abdominal pain. Abdominal pain, acute, generalized. Constipation for 5 days. Personal history of ulcerative colitis. Personal history pancreatitis. Personal history of renal cell carcinoma status post partial nephrectomy. EXAM: CT ABDOMEN AND PELVIS WITH CONTRAST TECHNIQUE: Multidetector CT imaging of the abdomen and pelvis was performed using the standard protocol following bolus administration of intravenous contrast. CONTRAST:  159mL ISOVUE-300 IOPAMIDOL (ISOVUE-300) INJECTION 61% COMPARISON:  CT of the abdomen and pelvis 06/05/2016 FINDINGS: Lower chest: The lung bases are clear without focal nodule, mass, or airspace disease. The heart size is normal. Coronary artery calcifications are present. No significant pleural or pericardial effusion is present. Hepatobiliary: No focal liver abnormality is seen. Status post cholecystectomy. No biliary  dilatation. Pancreas: Unremarkable. No pancreatic ductal dilatation or surrounding inflammatory changes. Spleen: Spleen  is stable, upper limits of normal for size. Adrenals/Urinary Tract: The adrenal glands are normal bilaterally. Kidneys and ureters are within normal limits. The urinary bladder is normal. Stomach/Bowel: Stomach and duodenum are within normal limits. Small bowel is unremarkable. Terminal ileum is within normal limits. Appendix is visualized and normal. The ascending and transverse colon are normal. Proximal descending colon is within normal limits. There is diffuse inflammatory change in the proximal sigmoid colon associated with diverticular disease. Wall thickening is present. There is no abscess or free air. More distal sigmoid colon and rectum are within normal limits. Vascular/Lymphatic: Atherosclerotic calcifications are present in the aorta without aneurysm. Reproductive: Status post hysterectomy. No adnexal masses. Other: A parapelvic all hernia contains fat without bowel. The defect is 1.8 cm. A smaller ventral hernia, below the umbilicus, measures 8 mm. This also contains fat, but no bowel. Musculoskeletal: Chronic endplate degenerative changes are present at L5-S1. Vacuum disc is present. Vertebral body heights alignment are maintained. No focal lytic or blastic lesions are present. Bony pelvis is within normal limits. Hips are located and normal bilaterally. IMPRESSION: 1. Focal thickening of the proximal sigmoid colon with at least 1 diverticula. This may related to acute diverticulitis or flare of ulcerative colitis. No perforation or abscess is evident. 2.  Aortic Atherosclerosis (ICD10-I70.0). 3. Coronary artery disease 4. Two ventral hernias each contain fat without associated bowel. Electronically Signed   By: San Morelle M.D.   On: 05/04/2018 14:02    Procedures Procedures (including critical care time)  Medications Ordered in ED Medications  iopamidol  (ISOVUE-300) 61 % injection (has no administration in time range)  sodium chloride 0.9 % injection (has no administration in time range)  iopamidol (ISOVUE-300) 61 % injection (has no administration in time range)  fentaNYL (SUBLIMAZE) injection 50 mcg (has no administration in time range)  lactated ringers bolus 1,000 mL (0 mLs Intravenous Stopped 05/04/18 1332)  fentaNYL (SUBLIMAZE) injection 50 mcg (50 mcg Intravenous Given 05/04/18 1229)  iopamidol (ISOVUE-300) 61 % injection 100 mL (100 mLs Intravenous Contrast Given 05/04/18 1308)     Initial Impression / Assessment and Plan / ED Course  I have reviewed the triage vital signs and the nursing notes.  Pertinent labs & imaging results that were available during my care of the patient were reviewed by me and considered in my medical decision making (see chart for details).     Courtney Olson is a 52 year old female with history of ulcerative colitis who presents to the ED with left lower quadrant abdominal pain.  Patient with normal vitals.  Low-grade fever.  Pain is been ongoing for the last several days.  Denies any diarrhea and states that she feels mildly constipated.  Patient denies any nausea, vomiting.  Patient denies any chest pain, shortness of breath.  Has had a prior hysterectomy.  Is passing gas.  Patient called her gastroenterologist today and they told her to come for evaluation the ED.  She is tender mostly in the left lower quadrant on exam but no signs to suggest peritonitis.  Otherwise exam is unremarkable.  Lab work and imaging of the abdomen and pelvis were obtained.  Patient was given IV lactated Ringer bolus and IV fentanyl for pain.  Patient with CT scan showing signs for possible acute diverticulitis versus UC flare.  No signs of abscess or perforation of the abdomen.  Patient had no significant leukocytosis.  Normal lactic acid.  No significant anemia, electrolyte abnormality.  Lipase normal doubt pancreatitis.  Urinalysis  negative for infection.  Patient felt improved following IV fluids and IV pain medicine.  Talked with [facility] and we are both in agreement with treatment with Cipro and Flagyl.  Patient also wants to try outpatient treatment as well.  GI will flag the patient for close follow-up and patient was discharged from the ED in good condition.  Understands return precautions.  This chart was dictated using voice recognition software.  Despite best efforts to proofread,  errors can occur which can change the documentation meaning.   Final Clinical Impressions(s) / ED Diagnoses   Final diagnoses:  Acute diverticulitis    ED Discharge Orders         Ordered    metroNIDAZOLE (FLAGYL) 500 MG tablet  2 times daily     05/04/18 1436    ciprofloxacin (CIPRO) 500 MG tablet  2 times daily     05/04/18 1436    ondansetron (ZOFRAN) 4 MG tablet  Every 6 hours     05/04/18 1437           Lennice Sites, DO 05/04/18 1437

## 2018-05-04 NOTE — ED Notes (Signed)
Patient transported to CT 

## 2018-05-04 NOTE — ED Provider Notes (Signed)
Patient calls and advises that she did not get her prescription for Zofran.  She did start her Cipro and Flagyl.  I was unable to E prescribe Zofran given that she is Art[facility] been discharged.  I attempted to call the pharmacy but the voice recorder was only excepting refills.  I will leave the information with the charge nurse to call in the prescription tomorrow morning when pharmacy opens.   Malvin Johns, MD 05/04/18 2109

## 2018-05-04 NOTE — ED Notes (Signed)
Pt observed to walk from CT to her room while CT tech pushed bed. Pt requested to go to BR and walked unassisted and w/o difficulty. Pt advised to provide urine sample. Pt ambulated back to room w/o difficulty and w/ steady gait

## 2018-05-26 DIAGNOSIS — H6691 Otitis media, unspecified, right ear: Secondary | ICD-10-CM | POA: Diagnosis not present

## 2018-06-09 DIAGNOSIS — K5732 Diverticulitis of large intestine without perforation or abscess without bleeding: Secondary | ICD-10-CM | POA: Diagnosis not present

## 2018-06-09 DIAGNOSIS — K5190 Ulcerative colitis, unspecified, without complications: Secondary | ICD-10-CM | POA: Diagnosis not present

## 2018-07-03 DIAGNOSIS — L821 Other seborrheic keratosis: Secondary | ICD-10-CM | POA: Diagnosis not present

## 2018-07-03 DIAGNOSIS — L218 Other seborrheic dermatitis: Secondary | ICD-10-CM | POA: Diagnosis not present

## 2018-07-03 DIAGNOSIS — D225 Melanocytic nevi of trunk: Secondary | ICD-10-CM | POA: Diagnosis not present

## 2018-08-14 DIAGNOSIS — R05 Cough: Secondary | ICD-10-CM | POA: Diagnosis not present

## 2018-08-14 DIAGNOSIS — R52 Pain, unspecified: Secondary | ICD-10-CM | POA: Diagnosis not present

## 2018-08-18 DIAGNOSIS — R05 Cough: Secondary | ICD-10-CM | POA: Diagnosis not present

## 2018-09-09 ENCOUNTER — Other Ambulatory Visit: Payer: Self-pay | Admitting: Family Medicine

## 2018-09-09 DIAGNOSIS — Z1231 Encounter for screening mammogram for malignant neoplasm of breast: Secondary | ICD-10-CM

## 2018-09-10 ENCOUNTER — Ambulatory Visit
Admission: RE | Admit: 2018-09-10 | Discharge: 2018-09-10 | Disposition: A | Discharge status: Home / Self Care | Payer: 59 | Source: Ambulatory Visit | Attending: Family Medicine | Admitting: Family Medicine

## 2018-09-10 ENCOUNTER — Other Ambulatory Visit: Payer: Self-pay | Admitting: Family Medicine

## 2018-09-10 DIAGNOSIS — Z1231 Encounter for screening mammogram for malignant neoplasm of breast: Secondary | ICD-10-CM

## 2018-09-10 DIAGNOSIS — R234 Changes in skin texture: Secondary | ICD-10-CM

## 2018-09-15 ENCOUNTER — Ambulatory Visit
Admission: RE | Admit: 2018-09-15 | Discharge: 2018-09-15 | Disposition: A | Discharge status: Home / Self Care | Payer: 59 | Source: Ambulatory Visit | Attending: Family Medicine | Admitting: Family Medicine

## 2018-09-15 ENCOUNTER — Ambulatory Visit: Payer: 59

## 2018-09-15 DIAGNOSIS — R928 Other abnormal and inconclusive findings on diagnostic imaging of breast: Secondary | ICD-10-CM | POA: Diagnosis not present

## 2018-09-15 DIAGNOSIS — R234 Changes in skin texture: Secondary | ICD-10-CM

## 2018-11-12 ENCOUNTER — Telehealth: Payer: Self-pay | Admitting: Cardiology

## 2018-11-12 NOTE — Telephone Encounter (Signed)
Call to schedule recall appointment/patient wanted to wait til 03/04/19 to see Dr. Radford Pax

## 2019-03-03 ENCOUNTER — Telehealth: Payer: Self-pay

## 2019-03-03 NOTE — Telephone Encounter (Signed)
Left message for patient to return phone call because we need to change 03/04/2019 visit with Dr Radford Pax to virtual visit.

## 2019-03-04 ENCOUNTER — Ambulatory Visit: Payer: Self-pay | Admitting: Cardiology

## 2019-04-24 ENCOUNTER — Telehealth: Payer: Self-pay | Admitting: Cardiology

## 2019-04-24 NOTE — Telephone Encounter (Signed)
°  Patient c/o Palpitations:  High priority if patient c/o lightheadedness, shortness of breath, or chest pain  1) How long have you had palpitations/irregular HR/ Afib? Are you having the symptoms now? Three weeks, but has gotten progressively worse  2) Are you currently experiencing lightheadedness, SOB or CP? lightheaded  3) Do you have a history of afib (atrial fibrillation) or irregular heart rhythm? yes  4) Have you checked your BP or HR? (document readings if available):     169/117   141/93   5) Are you experiencing any other symptoms?  High blood sugar    Pt notices that her afib acts up at night. Her BP goes low and she feels faint. She really just wants to see someone face to

## 2019-04-24 NOTE — Telephone Encounter (Signed)
Pt is complaining of low BP, most recent was 89/58, HR 61, pulse oximeter reads 92%, (pt has not taken her meds yet), several episodes of lightheadedness, she reports when she has these episodes she will lay down and put her feet up.   Pt passed out a few weeks ago, reports gaining around 5 pounds so she started walking in her house more and passed out again while sitting on her couch after walking. Pt feels as if she is in and out afib and can feel it more at night when laying down.   Her blood sugar has been elevated ranging from 220's and has gotten up to 400. Pt has contact PCP regarding her increased blood sugar.    Denies SOB, CP, N/V.  Pt has canceled virtual visits multiple times as she wants to see Dr. Radford Pax in office. Appt made with Turner on Tues Oct 13th.

## 2019-04-28 ENCOUNTER — Ambulatory Visit (INDEPENDENT_AMBULATORY_CARE_PROVIDER_SITE_OTHER): Payer: 59 | Admitting: Cardiology

## 2019-04-28 ENCOUNTER — Encounter: Payer: Self-pay | Admitting: Cardiology

## 2019-04-28 ENCOUNTER — Telehealth: Payer: Self-pay | Admitting: *Deleted

## 2019-04-28 ENCOUNTER — Other Ambulatory Visit: Payer: Self-pay

## 2019-04-28 VITALS — BP 114/78 | HR 68 | Ht 65.0 in | Wt 210.0 lb

## 2019-04-28 DIAGNOSIS — E7800 Pure hypercholesterolemia, unspecified: Secondary | ICD-10-CM

## 2019-04-28 DIAGNOSIS — E119 Type 2 diabetes mellitus without complications: Secondary | ICD-10-CM | POA: Diagnosis not present

## 2019-04-28 DIAGNOSIS — I480 Paroxysmal atrial fibrillation: Secondary | ICD-10-CM

## 2019-04-28 DIAGNOSIS — I10 Essential (primary) hypertension: Secondary | ICD-10-CM | POA: Diagnosis not present

## 2019-04-28 DIAGNOSIS — R0609 Other forms of dyspnea: Secondary | ICD-10-CM

## 2019-04-28 DIAGNOSIS — R079 Chest pain, unspecified: Secondary | ICD-10-CM

## 2019-04-28 DIAGNOSIS — R0600 Dyspnea, unspecified: Secondary | ICD-10-CM

## 2019-04-28 MED ORDER — METOPROLOL TARTRATE 50 MG PO TABS: ORAL_TABLET | ORAL | 0 refills | Status: DC

## 2019-04-28 MED ORDER — METOPROLOL TARTRATE 100 MG PO TABS: ORAL_TABLET | ORAL | 0 refills | Status: DC

## 2019-04-28 MED ORDER — METOPROLOL SUCCINATE ER 25 MG PO TB24: 12.5000 mg | ORAL_TABLET | Freq: Every day | ORAL | 3 refills | Status: DC

## 2019-04-28 MED ORDER — METOPROLOL TARTRATE 50 MG PO TABS: 50.0000 mg | ORAL_TABLET | Freq: Once | ORAL | 0 refills | Status: DC

## 2019-04-28 NOTE — Addendum Note (Signed)
Addended by: Rodman Key on: 04/28/2019 04:42 PM   Modules accepted: Orders

## 2019-04-28 NOTE — Progress Notes (Signed)
Cardiology Office Note    Date:  04/28/2019   ID:  Courtney Olson, DOB 30-Jun-1966, MRN [id number]  PCP:  Judge Stall, [place] (Inactive)  Cardiologist:  Fransico Him, MD   Chief Complaint  Patient presents with   New Patient (Initial Visit)    PAF and HTN    History of Present Illness:  Courtney Olson is a 53 y.o. female who is being seen today for the evaluation of PAF and HTN at the request of Judge Stall, [place].  Courtney Olson is a 53 y.o. female with a history of HTN, DM, HLD, PAF on NOAC, calcifications of the abdominal aorta by CT and remote history of chest pain with negative workup with stress test and echo.  She is here today for evaluation of DOE, exertional fatigue and palpitations. She thinks that she had COVID back in January.  She was on a ship and the day she got off she became ill with fever, chills, body aches, cough and SOB which lasted over a week.  She says that prior to getting sick she was walking at least 10-15K steps and after being sick she has become so fatigued that she can only walk 57 steps without getting DOE and fatigue and has to lay down.    She also has had a chronic cough since then.  She got an antibody test for COVID but said she had the test too late after her illness to be positive.  She also has had some tightness in her chest as well and feeling that she cannot take a deep breath.   She denies any PND, orthopnea, LE edema or syncope. She takes HCTZ for HTN and recently had an episode of dizziness and presyncope.  She took her BP and her SBP was 87mHg.  Otherwise her BP has been normal.  She also has noticed increased skipped heart beats recently and does not know if it is PVCs or PAF.  She is compliant with her meds and is tolerating meds with no SE.    Past Medical History:  Diagnosis Date   Aortic calcification (HCC)    Atrophic vaginitis 01/2006   Bradycardia 05/2014   Chronic panc[place]onic [place]ative colitis ([place] 2015   unknown if chronic.    C[place]oviral [place]   Diabetes mellitus without complication ([place]/1275  on Ace inhibitor to protect kidneys   Gallstones 01/2013   GERD (gastroesophageal reflux disease)    Hyperlipidemia    Hypertension    Kidney tumor 03/2013   Right   Obesity    OSA (obstructive sleep apnea)    PAF (paroxysmal atrial fibrillation) ([place]    Pancreatitis 2014   Postmenopausal    RA (rheumatoid arthritis) ([place]    Syncope and collapse    Ulcerative colitis ([place] 2015    Past Surgical History:  Procedure Laterality Date   CESAREAN SECTION     CHOLECYSTECTOMY  01/2013   COLONOSCOPY  2015   Colitis   DILATION AND CURETTAGE OF UTERUS  2002   HERNIA REPAIR  01/2013   Kidney and Bladder Stent and tube placed Right 03/2013   PARTIAL NEPHRECTOMY Right 02/2013   Tumor Removal    Procedure to remove kidney and bladder stent and tube Right 04/2013   TOTAL ABDOMINAL HYSTERECTOMY W/ BILATERAL SALPINGOOPHORECTOMY  2002   Secondary to AUB    Current Medications: Current Meds  Medication Sig   ALPRAZolam (XANAX) 0.25 MG tablet Take 1 tablet by mouth 2 (two) times  daily as needed.   atorvastatin (LIPITOR) 20 MG tablet Take 1 tablet (20 mg total) by mouth daily.   EPINEPHrine 0.3 mg/0.3 mL IJ SOAJ injection Inject 0.3 mLs into the skin as needed.   famotidine (PEPCID) 10 MG tablet Take 10 mg by mouth daily.   glipiZIDE (GLUCOTROL XL) 10 MG 24 hr tablet Take 10 mg by mouth daily.   hydrochlorothiazide (MICROZIDE) 12.5 MG capsule Take 1 capsule (12.5 mg total) by mouth daily.   Insulin Glargine (BASAGLAR KWIKPEN) 100 UNIT/ML SOPN Inject 14 Units into the skin daily.   mesalamine (LIALDA) 1.2 G EC tablet Take 1.2 g by mouth daily.    metoprolol succinate (TOPROL-XL) 25 MG 24 hr tablet Take 0.5 tablets (12.5 mg total) by mouth every evening. Please schedule an appt for further refills  2nd atempt   ramipril (ALTACE) 2.5 MG capsule Take 1 capsule (2.5 mg total)  by mouth daily.   rivaroxaban (XARELTO) 20 MG TABS tablet Take 1 tablet (20 mg total) by mouth daily. Please schedule yearly appt for anymore refills, thanks! [phone] 1st Attempt   TRULICITY 1.5 ME/1.5AX SOPN Inject 1.5 mg into the skin every 7 (seven) days.     Allergies:   Orange fruit [citrus], Bactrim [sulfamethoxazole-trimethoprim], and Tape   Social History   Socioeconomic History   Marital status: Married    Spouse name: Not on file   Number of children: Not on file   Years of education: Not on file   Highest education level: Not on file  Occupational History   Not on file  Social Needs   Financial resource strain: Not on file   Food insecurity    Worry: Not on file    Inability: Not on file   Transportation needs    Medical: Not on file    Non-medical: Not on file  Tobacco Use   Smoking status: Never Smoker   Smokeless tobacco: Never Used  Substance and Sexual Activity   Alcohol use: Yes    Comment: 2 a month   Drug use: No   Sexual activity: Yes    Partners: Male    Birth control/protection: Surgical    Comment: TAH/BSO  Lifestyle   Physical activity    Days per week: Not on file    Minutes per session: Not on file   Stress: Not on file  Relationships   Social connections    Talks on phone: Not on file    Gets together: Not on file    Attends religious service: Not on file    Active member of club or organization: Not on file    Attends meetings of clubs or organizations: Not on file    Relationship status: Not on file  Other Topics Concern   Not on file  Social History Narrative   Not on file     Family History:  The patient's family history includes Cancer in her maternal grandmother and paternal aunt; Diabetes in her father, maternal grandmother, mother, and paternal aunt; Fibromyalgia in her mother; Heart disease in her father; Hypertension in her father, mother, and sister.   ROS:   Please see the history of present  illness.    ROS All other systems reviewed and are negative.  No flowsheet data found.     PHYSICAL EXAM:   VS:  BP 114/78    Pulse 68    Ht _0  (1.651 m)    Wt 210 lb (95.3 kg)    LMP 07/16/2000 (  Approximate)    SpO2 98%    BMI 34.95 kg/m    GEN: Well nourished, well developed, in no acute distress  HEENT: normal  Neck: no JVD, carotid bruits, or masses Cardiac: RRR; no murmurs, rubs, or gallops,no edema.  Intact distal pulses bilaterally.  Respiratory:  clear to auscultation bilaterally, normal work of breathing GI: soft, nontender, nondistended, + BS MS: no deformity or atrophy  Skin: warm and dry, no rash Neuro:  Alert and Oriented x 3, Strength and sensation are intact Psych: euthymic mood, full affect  Wt Readings from Last 3 Encounters:  04/28/19 210 lb (95.3 kg)  05/04/18 205 lb (93 kg)  11/15/17 190 lb (86.2 kg)      Studies/Labs Reviewed:   EKG:  EKG is ordered today.  The ekg ordered today demonstrates NSR at 68bpm with iRBBB  Recent Labs: 05/04/2018: ALT 34; BUN 15; Creatinine, Ser 0.96; Hemoglobin 12.3; Platelets 163; Potassium 4.0; Sodium 136   Lipid Panel    Component Value Date/Time   CHOL 232 (H) 05/17/2014 1113   TRIG 172.0 (H) 05/17/2014 1113   HDL 55.50 05/17/2014 1113   CHOLHDL 4 05/17/2014 1113   VLDL 34.4 05/17/2014 1113   LDLCALC 142 (H) 05/17/2014 1113    Additional studies/ records that were reviewed today include:  none    ASSESSMENT:    1. PAF (paroxysmal atrial fibrillation) ([place])   2. Essential hypertension   3. Pure hypercholesterolemia   4. DM type 2, goal HbA1c < 7% (HCC)   5. Chest pain, unspecified type   6. DOE (dyspnea on exertion)      PLAN:  In order of problems listed above:  1.  PAF -maintaining NSR -having some palpitations but sounds like extra heart beats and not afib - EKG with isolatd PVC today in NSR -continue Toprol and Xarelto -no bleeding issues on DOAC -check BMET and CBC  2.   HTN -BP  controlled -she had 1 episode with dizziness and low BP likely related to dehydration but otherwise no low BPs -continue HCTZ 12.13m daily, Ramipril 2.579mdaily and Toprol XL 20103maily  3.  HLD -followed by PCP -LDL goal < 70 -continue statin  4.  Type 2 Dm -followup by PCP -continue Glipizide 42m65mily, Insulin, Trulicity and ACE I  5.  SOB/Exertional Fatigue/Chest tightness -she describes a myriad of symptoms but sound atypical for a cardiac etiology -her SOB she describes more as inability to take a deep breath and SOB talking on the phone -she also describes exertional fatigue and DOE ever since she had her viral illness in January that she thinks was COVID -she also describes problems with chest tightness when she cannot breath -I have recommended a coronary CTA given her CRFs including DM, HLD, HTN and fm hx of CAD -I will check a 2D echo to assess LVF -I have recommended that she followup back up with her PCP for other causes of fatigue.  We will be able to assess lungs on Chest CTA   Medication Adjustments/Labs and Tests Ordered: Current medicines are reviewed at length with the patient today.  Concerns regarding medicines are outlined above.  Medication changes, Labs and Tests ordered today are listed in the Patient Instructions below.  Patient Instructions  Medication Instructions:  No changes If you need a refill on your cardiac medications before your next appointment, please call your pharmacy.   Lab work: Today: BMET If you have labs (blood work) drawn today and your tests  are completely normal, you will receive your results only by:  MyChart Message (if you have MyChart) OR  A paper copy in the mail If you have any lab test that is abnormal or we need to change your treatment, we will call you to review the results.  Testing/Procedures: Your physician has requested that you have an echocardiogram. Echocardiography is a painless test that uses sound waves to  create images of your heart. It provides your doctor with information about the size and shape of your heart and how well your hearts chambers and valves are working. This procedure takes approximately one hour. There are no restrictions for this procedure.  Your physician has requested that you have cardiac CT. Cardiac computed tomography (CT) is a painless test that uses an x-ray machine to take clear, detailed pictures of your heart. For further information please visit HugeFiesta.tn. Please follow instruction sheet as given.     Follow-Up: At [facility], you and your health needs are our priority.  As part of our continuing mission to provide you with exceptional heart care, we have created designated Provider Care Teams.  These Care Teams include your primary Cardiologist (physician) and Advanced Practice Providers (APPs -  Physician Assistants and Nurse Practitioners) who all work together to provide you with the care you need, when you need it. You will need a follow up appointment in 12 months.  Please call our office 2 months in advance to schedule this appointment.  You may see Dr. Fransico Him or one of the following Advanced Practice Providers on your designated Care Team:   [place], PA-C Dayna Dunn, PA-C  Ermalinda Barrios, PA-C  Any Other Special Instructions Will Be Listed Below (If Applicable).   Your cardiac CT will be scheduled at one of the below locations:   [facility] [address] [place], [place] [address] (336) [place] [address] [place], [place] [address] [phone]  If scheduled at [facility], please arrive at the [facility] main entrance of [facility] 30-45 minutes prior to test start time. Proceed to the [facility] Radiology Department (first floor) to check-in and test prep.  If scheduled at [facility], please  arrive 15 mins early for check-in and test prep.  Please follow these instructions carefully (unless otherwise directed):   On the Night Before the Test:  Be sure to Drink plenty of water.  Do not consume any caffeinated/decaffeinated beverages or chocolate 12 hours prior to your test. Do not take any antihistamines 12 hours prior to your test.  On the Day of the Test:  Drink plenty of water. Do not drink any water within one hour of the test.  Do not eat any food 4 hours prior to the test.  You may take your regular medications prior to the test.   Take metoprolol (Lopressor) (50 mg) two hours prior to test.  HOLD Hydrochlorothiazide morning of the test.  FEMALES- please wear underwire-free bra if available       After the Test:  Drink plenty of water.  After receiving IV contrast, you may experience a mild flushed feeling. This is normal.  On occasion, you may experience a mild rash up to 24 hours after the test. This is not dangerous. If this occurs, you can take Benadryl 25 mg and increase your fluid intake.  If you experience trouble breathing, this can be serious. If it  is severe call 911 IMMEDIATELY. If it is mild, please call our office.  If you take any of these medications: Glipizide/Metformin, Avandament, Glucavance, please do not take 48 hours after completing test unless otherwise instructed.   Please contact the cardiac imaging nurse navigator should you have any questions/concerns Marchia Bond, RN Navigator Cardiac Imaging [facility] Heart and Vascular Services [phone] Office       Signed, Fransico Him, MD  04/28/2019 2:53 PM    [place], [place],   [address] Phone: [phone]; Fax: [phone]

## 2019-04-28 NOTE — Telephone Encounter (Signed)
yes

## 2019-04-28 NOTE — Telephone Encounter (Signed)
Called patient to confirm with her that it is okay to take lopressor 50 mg 2 hrs prior to ct per Dr. Radford Pax.  During the call we confirmed that pt is ordered Toprol XL 12.5 mg daily but that her metoprolol that she picked up (reordered from her PCP office) is short acting lopressor.  She is taking this daily.  She states she gets palpitations almost nightly and that this coincides with her last refill (90 days ago).  I refilled Toprol XL for 90 day supply 12.5 mg daily. She will start taking and monitor evening palpitations. Pt asked that Dr. Radford Pax refill her other cardiac meds like she has in the past for one year instead of PCP handling.

## 2019-04-28 NOTE — Patient Instructions (Addendum)
Medication Instructions:  No changes If you need a refill on your cardiac medications before your next appointment, please call your pharmacy.   Lab work: Today: BMET If you have labs (blood work) drawn today and your tests are completely normal, you will receive your results only by: Marland Kitchen MyChart Message (if you have MyChart) OR . A paper copy in the mail If you have any lab test that is abnormal or we need to change your treatment, we will call you to review the results.  Testing/Procedures: Your physician has requested that you have an echocardiogram. Echocardiography is a painless test that uses sound waves to create images of your heart. It provides your doctor with information about the size and shape of your heart and how well your heart's chambers and valves are working. This procedure takes approximately one hour. There are no restrictions for this procedure.  Your physician has requested that you have cardiac CT. Cardiac computed tomography (CT) is a painless test that uses an x-ray machine to take clear, detailed pictures of your heart. For further information please visit HugeFiesta.tn. Please follow instruction sheet as given.     Follow-Up: At [facility], you and your health needs are our priority.  As part of our continuing mission to provide you with exceptional heart care, we have created designated Provider Care Teams.  These Care Teams include your primary Cardiologist (physician) and Advanced Practice Providers (APPs -  Physician Assistants and Nurse Practitioners) who all work together to provide you with the care you need, when you need it. You will need a follow up appointment in 12 months.  Please call our office 2 months in advance to schedule this appointment.  You may see Dr. Fransico Him or one of the following Advanced Practice Providers on your designated Care Team:   [place], PA-C Melina Copa, PA-C . Ermalinda Barrios, PA-C  Any Other Special  Instructions Will Be Listed Below (If Applicable). Addendum: pt having lab work at PCP on 10/19 and will have forwarded to Dr. Radford Pax.  None done today in office. Also, pt asked me to confirm with Dr. Radford Pax that her HR will not drop too low if she takes lopressor 50 mg.  Dr. Radford Pax confirmed she wants patient to take lopressor 50 mg 2 hrs prior to CT.  She will take 2 of her   Your cardiac CT will be scheduled at one of the below locations:   [facility] [address] [place], [place] [address] (336) [place] [address] [place], [place] [address] [phone]  If scheduled at [facility], please arrive at the [facility] main entrance of [facility] 30-45 minutes prior to test start time. Proceed to the [facility] Radiology Department (first floor) to check-in and test prep.  If scheduled at [facility], please arrive 15 mins early for check-in and test prep.  Please follow these instructions carefully (unless otherwise directed):   On the Night Before the Test: . Be sure to Drink plenty of water. . Do not consume any caffeinated/decaffeinated beverages or chocolate 12 hours prior to your test. Do not take any antihistamines 12 hours prior to your test.  On the Day of the Test: . Drink plenty of water. Do not drink any water within one hour of the test. . Do not eat any food 4 hours prior to the test. . You may take your regular  medications prior to the test.  . Take metoprolol (Lopressor) (50 mg) two hours prior to test. . HOLD Hydrochlorothiazide morning of the test. . FEMALES- please wear underwire-free bra if available       After the Test: . Drink plenty of water. . After receiving IV contrast, you may experience a mild flushed feeling. This is normal. . On occasion, you may experience a mild rash up to 24 hours after the test. This is not  dangerous. If this occurs, you can take Benadryl 25 mg and increase your fluid intake. . If you experience trouble breathing, this can be serious. If it is severe call 911 IMMEDIATELY. If it is mild, please call our office. . If you take any of these medications: Glipizide/Metformin, Avandament, Glucavance, please do not take 48 hours after completing test unless otherwise instructed.   Please contact the cardiac imaging nurse navigator should you have any questions/concerns Marchia Bond, RN Navigator Cardiac Imaging [facility] Heart and Vascular Services [phone] Office

## 2019-05-06 ENCOUNTER — Other Ambulatory Visit: Payer: Self-pay

## 2019-05-06 ENCOUNTER — Ambulatory Visit (HOSPITAL_COMMUNITY): Payer: 59 | Attending: Cardiology

## 2019-05-06 DIAGNOSIS — E7800 Pure hypercholesterolemia, unspecified: Secondary | ICD-10-CM

## 2019-05-06 DIAGNOSIS — E119 Type 2 diabetes mellitus without complications: Secondary | ICD-10-CM | POA: Diagnosis present

## 2019-05-06 DIAGNOSIS — I480 Paroxysmal atrial fibrillation: Secondary | ICD-10-CM | POA: Insufficient documentation

## 2019-05-06 DIAGNOSIS — R079 Chest pain, unspecified: Secondary | ICD-10-CM

## 2019-05-06 DIAGNOSIS — I10 Essential (primary) hypertension: Secondary | ICD-10-CM | POA: Diagnosis present

## 2019-05-06 MED ORDER — PERFLUTREN LIPID MICROSPHERE
1.0000 mL | INTRAVENOUS | Status: AC | PRN
  Administered 2019-05-06: 2 mL via INTRAVENOUS

## 2019-05-07 ENCOUNTER — Telehealth: Payer: Self-pay

## 2019-05-07 ENCOUNTER — Other Ambulatory Visit: Payer: 59 | Admitting: *Deleted

## 2019-05-07 DIAGNOSIS — I10 Essential (primary) hypertension: Secondary | ICD-10-CM

## 2019-05-07 NOTE — Telephone Encounter (Signed)
-----   Message from Sueanne Margarita, MD sent at 05/07/2019  9:30 AM EDT ----- Please have her come in for BNP

## 2019-05-07 NOTE — Telephone Encounter (Signed)
Notes recorded by Frederik Schmidt, RN on 05/07/2019 at 9:39 AM EDT  The patient has been notified of the Echo result and verbalized understanding. All questions (if any) were answered.  Frederik Schmidt, RN 05/07/2019 9:39 AM   ------

## 2019-05-08 ENCOUNTER — Telehealth: Payer: Self-pay | Admitting: Cardiology

## 2019-05-08 DIAGNOSIS — Z79899 Other long term (current) drug therapy: Secondary | ICD-10-CM

## 2019-05-08 LAB — PRO B NATRIURETIC PEPTIDE: NT-Pro BNP: 250 pg/mL — ABNORMAL HIGH (ref 0–249)

## 2019-05-08 MED ORDER — FUROSEMIDE 20 MG PO TABS: 20.0000 mg | ORAL_TABLET | Freq: Every day | ORAL | 3 refills | Status: DC

## 2019-05-08 NOTE — Telephone Encounter (Signed)
Pt called and she has been talking to her PCP that she is seeing today and asking if we can add on COVID antibody testing to the labs she had drawn this week... her PCP is wondering if all of her problems can be residual "effects" from having COVID but she has not had a positive COVID test in the oast.   I advised her that she should talk further with her PCP at the [facility] office in [facility] about ordering and that her blood that we took is at [facility] but I am unsure of blood is still available for new tests and if the right tubes have been drawn... she will talk with them today at her appt.    Pt left a letter for Dr. Radford Pax in the office yesterday.. it was asking Dr. Radford Pax to order her an abdominal CT along with her Cardiac CT so she can avoid having contrast more than once..   She is not seeing her GI MD until 06/11/19 but he had mentioned he wanted her to have it at some point after COVID. I asked her to talk with her PCP today and after she gets a date and time for her Cardiac CT she can have them talk with the CT dept but I am unsure of she can have both done at the same time and if it would even cut back ion the contrast since 2 different tests.   Py also anxious about her BNP and I advised her we will call her as soon as Dr. Radford Pax reviews it.

## 2019-05-08 NOTE — Telephone Encounter (Signed)
-----   Message from Sueanne Margarita, MD sent at 05/08/2019 12:14 PM EDT ----- BNP borderline elevated - stop HCTZ and start lasix 20mg  daily and check BMET in 1 week and followup virtual visit with PA in 4 weeks

## 2019-05-08 NOTE — Telephone Encounter (Signed)
Notes recorded by Frederik Schmidt, RN on 05/08/2019 at 12:22 PM EDT  The patient has been notified of the result and verbalized understanding. All questions (if any) were answered.  Frederik Schmidt, RN 05/08/2019 12:22 PM

## 2019-05-08 NOTE — Telephone Encounter (Signed)
PCP needs to order COVID test as well as what ever abdominal imaging GI wants

## 2019-05-08 NOTE — Telephone Encounter (Signed)
New message   Patient would like to know if a covid antibody test can be ordered.  Please advise.

## 2019-05-11 ENCOUNTER — Telehealth: Payer: Self-pay

## 2019-05-11 ENCOUNTER — Telehealth: Payer: Self-pay | Admitting: Cardiology

## 2019-05-11 DIAGNOSIS — R7989 Other specified abnormal findings of blood chemistry: Secondary | ICD-10-CM

## 2019-05-11 DIAGNOSIS — I10 Essential (primary) hypertension: Secondary | ICD-10-CM

## 2019-05-11 NOTE — Telephone Encounter (Signed)
Please let her know that she is not in heart failure.  Her heart function is normal but heart muscle is a little stiff.  Her BNP was minimally elevated consistent with retaining too much fluid likely related to stiff heart muscle that comes with aging and HTN.  This is called diastolic dysfunction and is very common.  She needs to avoid added salt in her diet and work on diet and exercise.

## 2019-05-11 NOTE — Telephone Encounter (Signed)
I spoke to the patient and reviewed her BNP results.  She verbalized understanding.

## 2019-05-11 NOTE — Telephone Encounter (Signed)
I spoke to the patient who has a lot of concern about her BNP value.  She spoke to her PCP about it and she "terrified" the patient telling her "that she was in heart failure".  She would like to further discuss this with you, if able at your convenience.  [phone].  Thank you.

## 2019-05-11 NOTE — Telephone Encounter (Signed)
New message   Patient wants a call back to go over BMET test in detail.

## 2019-05-12 ENCOUNTER — Ambulatory Visit: Payer: Self-pay | Admitting: Physician Assistant

## 2019-05-12 ENCOUNTER — Telehealth (HOSPITAL_COMMUNITY): Payer: Self-pay | Admitting: Emergency Medicine

## 2019-05-12 ENCOUNTER — Other Ambulatory Visit (HOSPITAL_BASED_OUTPATIENT_CLINIC_OR_DEPARTMENT_OTHER): Payer: Self-pay | Admitting: Physician Assistant

## 2019-05-12 DIAGNOSIS — K4190 Unilateral femoral hernia, without obstruction or gangrene, not specified as recurrent: Secondary | ICD-10-CM

## 2019-05-12 NOTE — Addendum Note (Signed)
Addended by: Antonieta Iba on: 05/12/2019 12:44 PM   Modules accepted: Orders

## 2019-05-12 NOTE — Telephone Encounter (Signed)
Reaching out to patient to offer assistance regarding upcoming cardiac imaging study; pt verbalizes understanding of appt date/time, parking situation and where to check in, pre-test NPO status and medications ordered, and verified current allergies; name and call back number provided for further questions should they arise Taji Barretto RN Navigator Cardiac Imaging [place] [facility] [phone] office [phone] cell 

## 2019-05-12 NOTE — Telephone Encounter (Signed)
Called patient and made her aware that she will be getting a BNP along with BMET on 10/30 per Dr. Radford Pax

## 2019-05-12 NOTE — Telephone Encounter (Signed)
Ok to repeat BNP

## 2019-05-12 NOTE — Telephone Encounter (Signed)
Spoke with the Courtney Olson and she verbalized understanding of Dr. Theodosia Blender information re: her BNP.Marland Kitchen   Courtney Olson is having a BMET 05/15/19 and was asking if Dr. Radford Pax could order another BNP for her peace of mind.   Will forward to Dr. Radford Pax.

## 2019-05-13 ENCOUNTER — Ambulatory Visit
Admission: RE | Admit: 2019-05-13 | Discharge: 2019-05-13 | Disposition: A | Discharge status: Home / Self Care | Payer: 59 | Source: Ambulatory Visit | Attending: Cardiology | Admitting: Cardiology

## 2019-05-13 ENCOUNTER — Ambulatory Visit
Admission: RE | Admit: 2019-05-13 | Discharge: 2019-05-13 | Disposition: A | Discharge status: Home / Self Care | Payer: 59 | Source: Ambulatory Visit | Attending: Physician Assistant | Admitting: Physician Assistant

## 2019-05-13 ENCOUNTER — Other Ambulatory Visit: Payer: Self-pay

## 2019-05-13 DIAGNOSIS — I2510 Atherosclerotic heart disease of native coronary artery without angina pectoris: Secondary | ICD-10-CM

## 2019-05-13 DIAGNOSIS — K4190 Unilateral femoral hernia, without obstruction or gangrene, not specified as recurrent: Secondary | ICD-10-CM

## 2019-05-13 DIAGNOSIS — R079 Chest pain, unspecified: Secondary | ICD-10-CM

## 2019-05-13 IMAGING — CT CT ABD-PELV W/ CM
1 series · 13 of 40 positions shown, 16 images · IV contrast (omnipaque)
Comparison: 05/04/2018

CLINICAL DATA: Femoral hernia on the right

EXAM:
CT ABDOMEN AND PELVIS WITH CONTRAST
TECHNIQUE: Multidetector CT imaging of the abdomen and pelvis was performed
using the standard protocol following bolus administration of
intravenous contrast.
CONTRAST:  75mL OMNIPAQUE IOHEXOL 350 MG/ML SOLN

[Series 7: coronals abd pelvis 2.00 cor · coronal · 0.84mm/px · 13 of 210 slices shown, 16 images]
[im 7/210  lung]
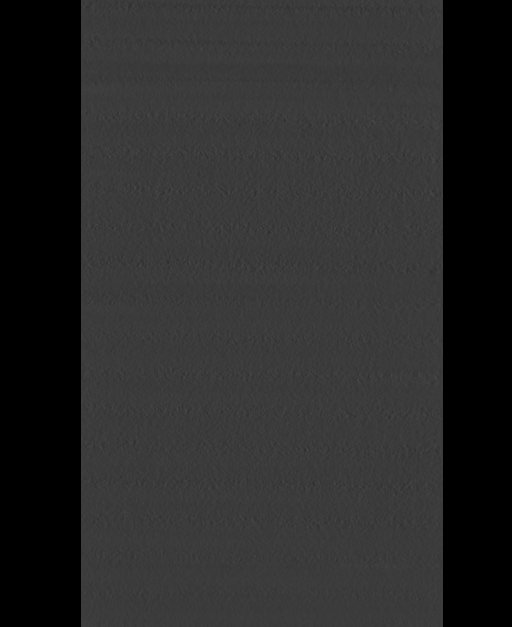
[im 14/210  soft-tissue]
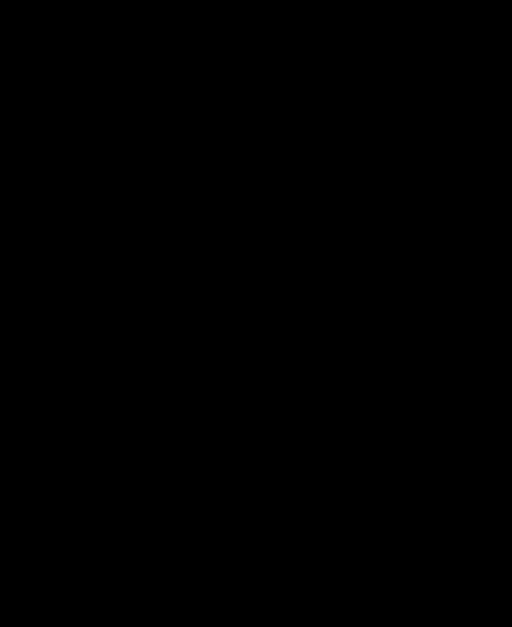
[im 14/210  lung]
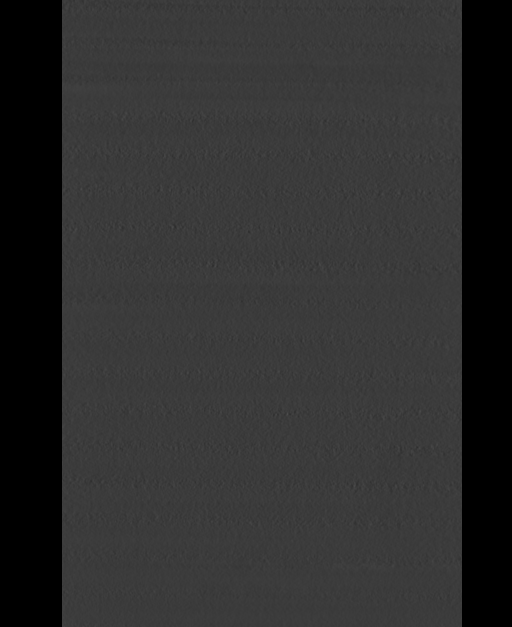
[im 14/210  bone]
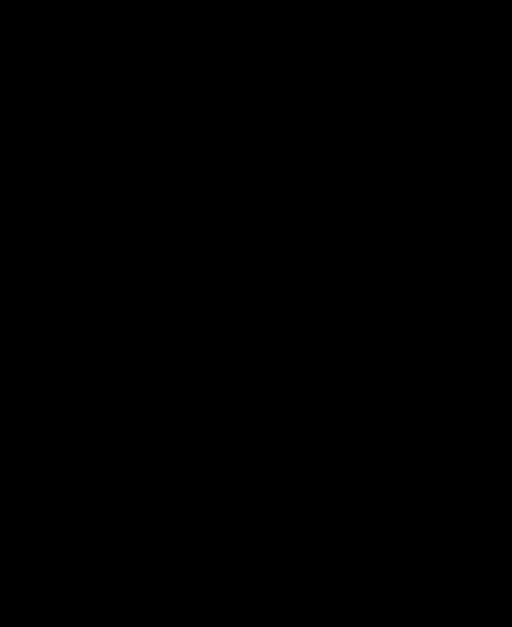
[im 21/210  lung]
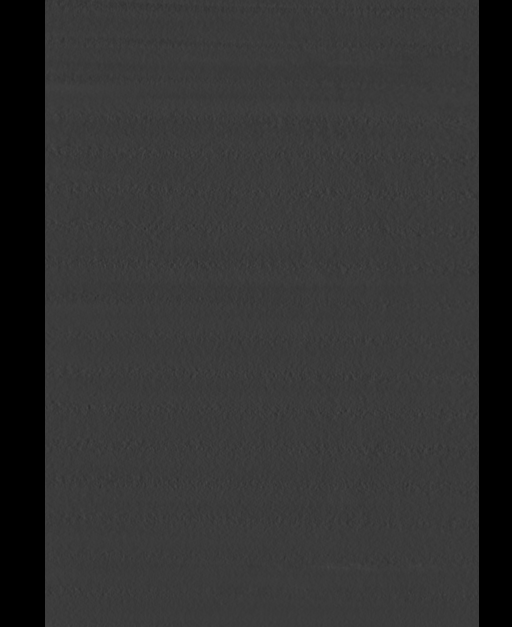
[im 27/210  lung]
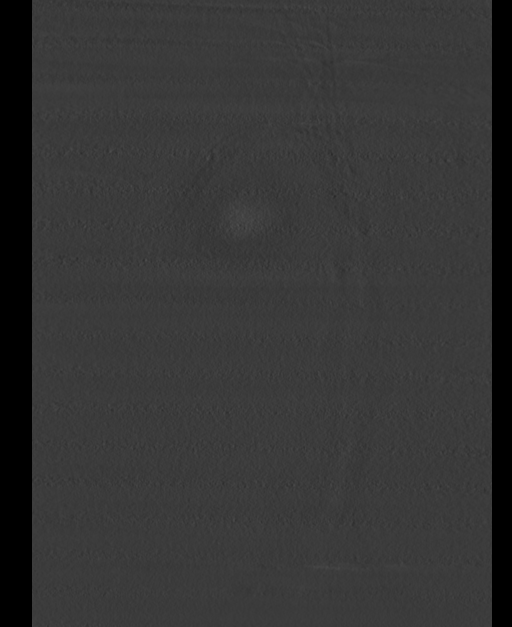
[im 34/210  soft-tissue]
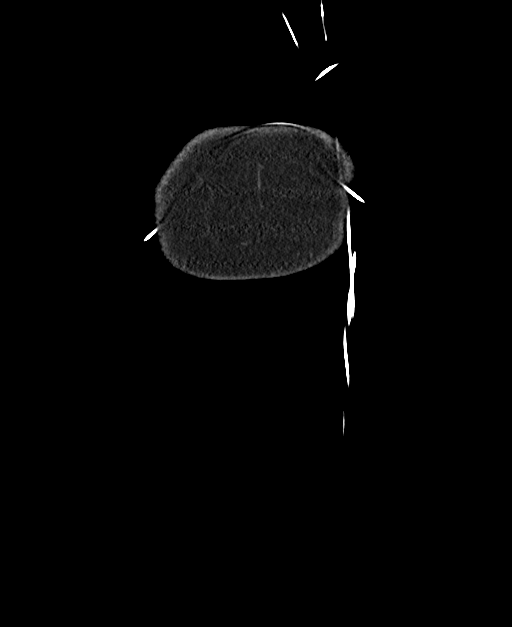
[im 54/210  soft-tissue]
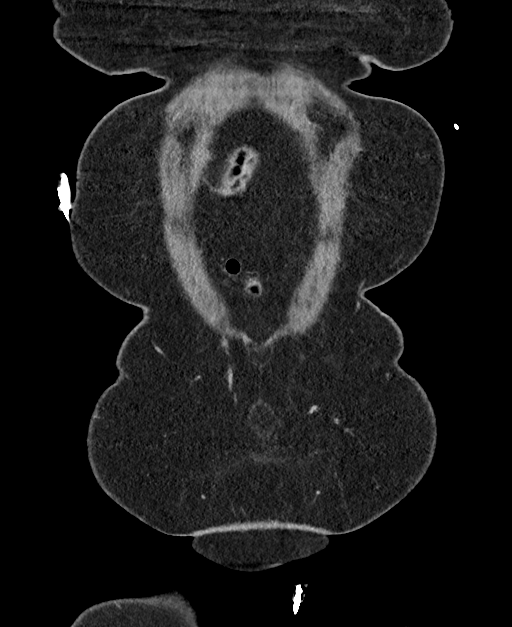
[im 75/210  soft-tissue]
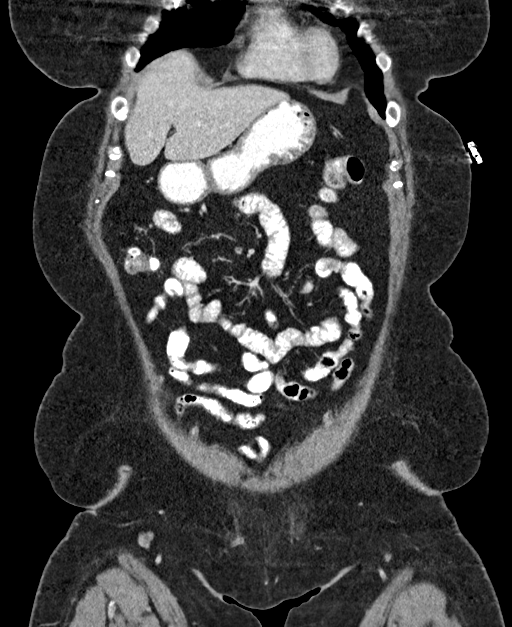
[im 95/210  soft-tissue]
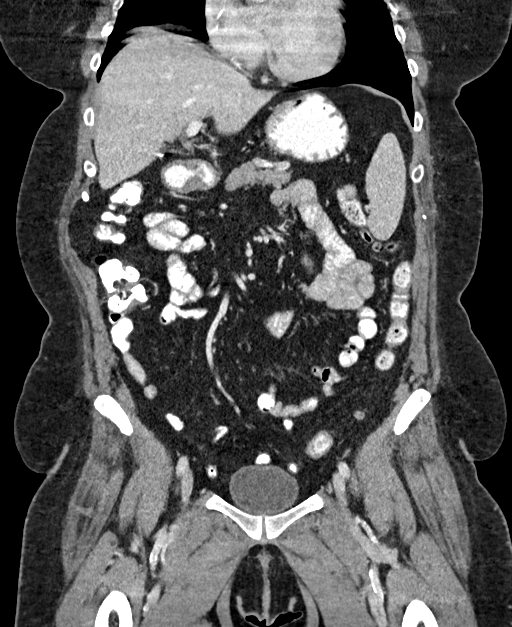
[im 115/210  soft-tissue]
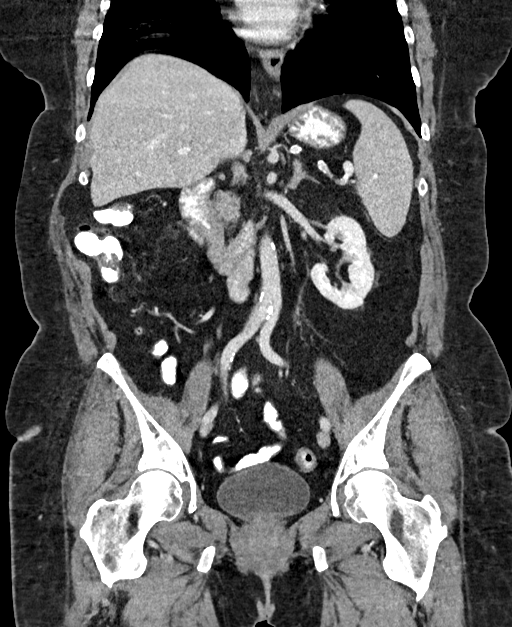
[im 135/210  soft-tissue]
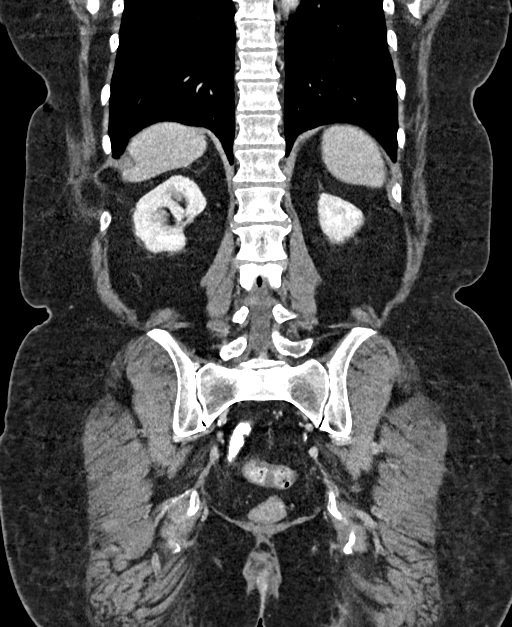
[im 156/210  soft-tissue]
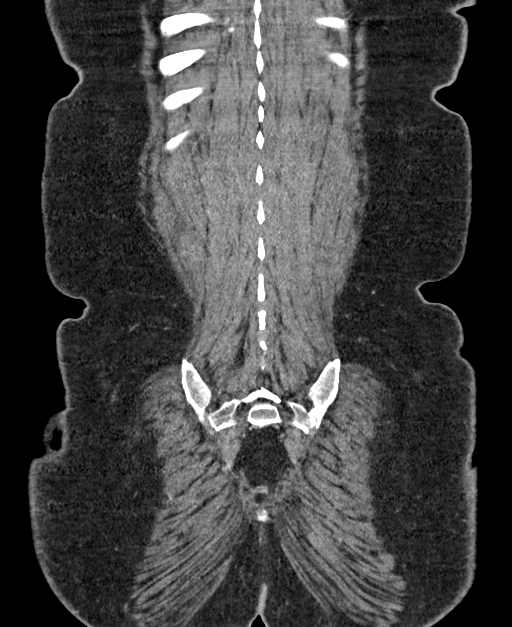
[im 176/210  soft-tissue]
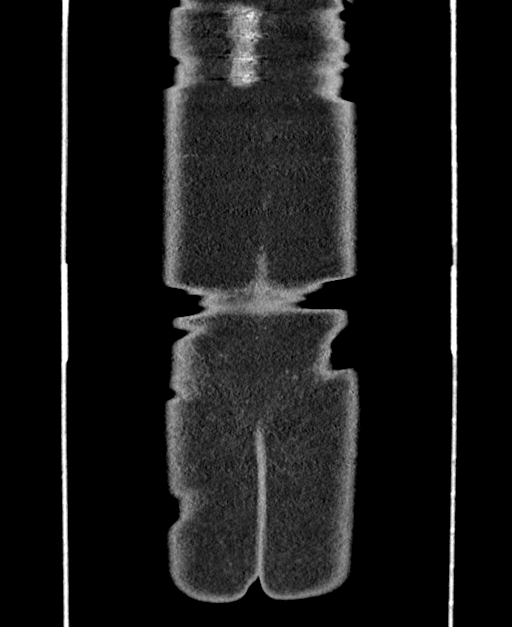
[im 176/210  bone]
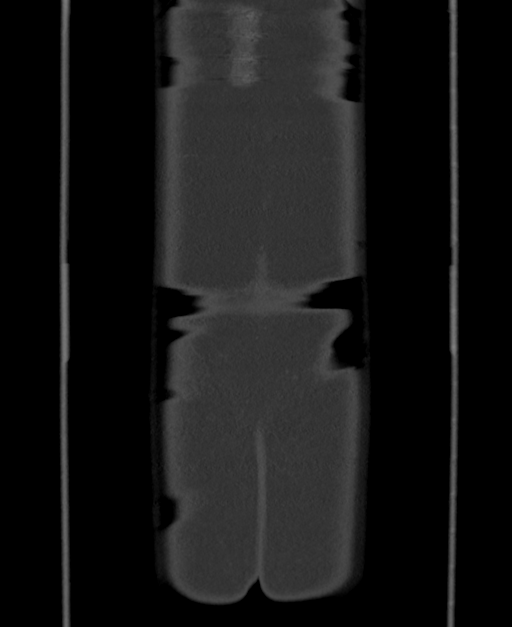
[im 196/210  soft-tissue]
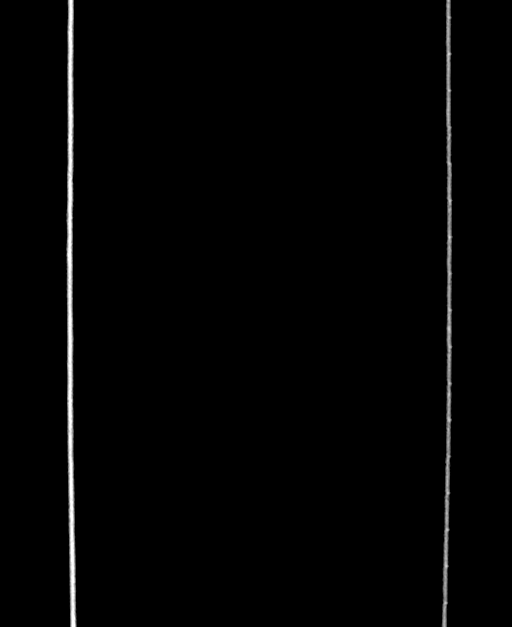

[13 of 40 positions shown; findings below may reference images not displayed]

FINDINGS: Lower chest: Lung bases are clear. No effusions. Heart is normal
size. Coronary artery calcifications seen in the right coronary
artery.

Hepatobiliary: No focal liver abnormality is seen. Status post
cholecystectomy. No biliary dilatation.

Pancreas: No focal abnormality or ductal dilatation.

Spleen: No focal abnormality.  Normal size.

Adrenals/Urinary Tract: No adrenal abnormality. No focal renal
abnormality. No stones or hydronephrosis. Urinary bladder is
unremarkable.

Stomach/Bowel: Normal appendix. Stomach, large and small bowel
grossly unremarkable.

Vascular/Lymphatic: Aortic atherosclerosis. No enlarged abdominal or
pelvic lymph nodes.

Reproductive: Prior hysterectomy.  No adnexal masses.

Other: No free fluid or free air. No visible inguinal hernia. Small
umbilical and infraumbilical ventral hernias containing fat. These
are stable.

Musculoskeletal: No acute bony abnormality.
IMPRESSION: No visible inguinal/femoral hernia. Small umbilical and
infraumbilical ventral hernias containing fat, stable.

No acute findings in the abdomen or pelvis.

## 2019-05-13 MED ORDER — NITROGLYCERIN 0.4 MG SL SUBL
0.8000 mg | SUBLINGUAL_TABLET | Freq: Once | SUBLINGUAL | Status: AC
  Administered 2019-05-13: 11:00:00 0.8 mg via SUBLINGUAL

## 2019-05-13 MED ORDER — IOHEXOL 350 MG/ML SOLN
75.0000 mL | Freq: Once | INTRAVENOUS | Status: AC | PRN
  Administered 2019-05-13: 75 mL via INTRAVENOUS

## 2019-05-13 MED ORDER — METOPROLOL TARTRATE 5 MG/5ML IV SOLN: 5.0000 mg | INTRAVENOUS | Status: DC | PRN

## 2019-05-13 NOTE — Telephone Encounter (Signed)
Pt notified that scan can be done today as noted in my chart message

## 2019-05-14 ENCOUNTER — Telehealth: Payer: Self-pay

## 2019-05-14 NOTE — Telephone Encounter (Signed)
-----   Message from Sueanne Margarita, MD sent at 05/14/2019 11:05 AM EDT ----- Please let patient know that she has severe CAD of the RCA which appears occluded and moderate disease of the LAD.  I would like to proceed with cardiac cath.  Please set her up to see extender for precath workup

## 2019-05-14 NOTE — Telephone Encounter (Signed)
Notes recorded by Frederik Schmidt, RN on 05/14/2019 at 11:38 AM EDT  The patient has been notified of the result and verbalized understanding. All questions (if any) were answered.  Frederik Schmidt, RN 05/14/2019 11:38 AM

## 2019-05-15 ENCOUNTER — Other Ambulatory Visit: Payer: 59 | Admitting: *Deleted

## 2019-05-15 ENCOUNTER — Other Ambulatory Visit: Payer: Self-pay

## 2019-05-15 ENCOUNTER — Telehealth: Payer: Self-pay | Admitting: Cardiology

## 2019-05-15 DIAGNOSIS — Z79899 Other long term (current) drug therapy: Secondary | ICD-10-CM

## 2019-05-15 DIAGNOSIS — R7989 Other specified abnormal findings of blood chemistry: Secondary | ICD-10-CM

## 2019-05-15 DIAGNOSIS — I10 Essential (primary) hypertension: Secondary | ICD-10-CM

## 2019-05-15 LAB — BASIC METABOLIC PANEL
BUN/Creatinine Ratio: 15 (ref 9–23)
BUN: 17 mg/dL (ref 6–24)
CO2: 23 mmol/L (ref 20–29)
Calcium: 9.3 mg/dL (ref 8.7–10.2)
Chloride: 96 mmol/L (ref 96–106)
Creatinine, Ser: 1.14 mg/dL — ABNORMAL HIGH (ref 0.57–1.00)
GFR calc Af Amer: 63 mL/min/{1.73_m2} (ref >59)
GFR calc non Af Amer: 55 mL/min/{1.73_m2} — ABNORMAL LOW (ref >59)
Glucose: 238 mg/dL — ABNORMAL HIGH (ref 65–99)
Potassium: 4.4 mmol/L (ref 3.5–5.2)
Sodium: 137 mmol/L (ref 134–144)

## 2019-05-15 LAB — PRO B NATRIURETIC PEPTIDE: NT-Pro BNP: 382 pg/mL — ABNORMAL HIGH (ref 0–249)

## 2019-05-15 NOTE — Telephone Encounter (Signed)
I spoke to the patient who continues to have pain in her upper back area radiating to the left.  I advised her to go to the ED knowing the results of her Coronary CT.  She verbalized understanding.

## 2019-05-15 NOTE — Telephone Encounter (Signed)
° ° °  Patient calling to report left ,upper back pain. Woke up during the night considered going to ED.  Patient also returning call to schedule heart cath.

## 2019-05-17 ENCOUNTER — Encounter: Payer: Self-pay | Admitting: Physician Assistant

## 2019-05-17 NOTE — H&P (View-Only) (Signed)
Cardiology Office Note    Date:  05/19/2019   ID:  Courtney Olson, DOB 15-Mar-1966, MRN [id number]  PCP:  Judge Stall, [place] (Inactive)  Cardiologist:  Fransico Him, MD  Electrophysiologist:  None   Chief Complaint: discuss cath result  History of Present Illness:   Courtney Olson is a 53 y.o. female with history of HTN, DM, paroxysmal atrial fibrillation on Xarelto, vasovagal syncope, pancolitis, calcifications of the abdominal aorta by CT, CMV hepatitis, hyperlipidemia, OSA, RA, GERD, obesity, bradycardia, pancreatitis, suspected mild renal insufficiency (?CKD stage II based on labs) who presents to discuss cath at the request of Dr. Radford Pax due to concern for CAD.  She has a remote history of chest pain with negative workup with stress test and echo. Nuc 07/2012 was normal. 2D echo 03/2014 showed normal EF, grade 1 DD. ETT 04/2014 was normal. She has had pancreatitis and was diagnosed with a kidney mass and underwent a large resection of a benign mass complicated by multiple infections. In July 2015 she had an episode of syncope following severe abdominal pain, felt vasovagal in nature. She actually reported over 40 episodes of syncope in the last 10 years. These initially began in the setting of anaphylactic reactions but in more recent years have followed acute abdominal pain related to her ulcerative colitis. It has been felt that prevention of the GI pain was the key to avoiding further syncope. Cardiac monitor 05/2016 showed NSR (sinus brady-sinus tach 51-127bpm), average HR 73bpm, isoalted atrial couplet.   She recently saw Dr. Radford Pax on 04/28/19 for evaluation of DOE, exertional fatigue, chest tightness and palpitations. She thought that she had COVID back in January. She was on a cruise ship and the day she got off she became ill with fever, chills, body aches, cough and SOB which lasted over a week. Multiple people from her group became ill and none of them tested positive for the flu or strep.  Prior to getting sick, she was walking at least 10-15K steps and after being sick she has become so fatigued that she can only walk 57 steps without getting DOE and fatigue. She has also had intermittent cough. She was noticing high blood pressure over the summer but in the context of anxiety over waiting for an in-person appointment. She had also noticed some decreased BPs in the 0000000 systolic after exercise. 2D echo 05/06/19 showed EF 60-65%, impaired diastolic relaxation, no acute abnormalities. BNP 05/07/19 at was mildly elevated at 250 so HCTZ was stopped and she was started on Lasix. Coronary CTA 05/13/19 was abnormal as outlined below with concern for CTO of the RCA and moderate prox-mid LAD disease. Dr. Radford Pax recommended she be seen back to discuss cardiac catheterization. Otherwise f/u Epic labs 05/15/19 showed BNP 382, K 4.4, Cr 1.14 (baseline Cr appearing 0.9-1.2), glucose 238. [facility] labs 05/04/19 showed Tchol 158, HDL 62, LDL 69, trig 135, A1C 8.1, Hgb 13.4, ALT 24.  She is seen back for follow-up overall stable. She came in with numerous questions today which I tried my best to answer. She has felt as though she's gotten conflicting information speaking with numerous people in our office about her results the last few weeks (fluid around heart, fluid in lungs, etc). She googled the BNP level and was extremely concerned seeing statistics about mortality rate and CHF. The exertional dyspnea is no worse than when it started back in January (no unstable acute symptoms), but still a definite change from her baseline. She also notices that even  something as simple as taking a shower and getting cleaned up results in significant fatigue. This is frustrating to her as she was quite active before. No anginal type chest pain or orthopnea. She did notice a period of time where she had pain between her shoulder blades for 2 weeks but felt it was related to mattress.   Past Medical History:  Diagnosis Date    Aortic calcification (HCC)    Atrophic vaginitis 01/2006   Bradycardia 05/2014   CAD (coronary artery disease)    a. noted on cor CT.   Chronic pancolonic ulcerative colitis ([place]) 2015   unknown if chronic.   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   Diabetes mellitus without complication ([place]) 99991111   on Ace inhibitor to protect kidneys   Gallstones 01/2013   GERD (gastroesophageal reflux disease)    Hyperlipidemia    Hypertension    Kidney tumor 03/2013   Right   Obesity    OSA (obstructive sleep apnea)    PAF (paroxysmal atrial fibrillation) ([place])    Pancreatitis 2014   Postmenopausal    RA (rheumatoid arthritis) ([place])    Syncope and collapse    Ulcerative colitis ([place]) 2015    Past Surgical History:  Procedure Laterality Date   CESAREAN SECTION     CHOLECYSTECTOMY  01/2013   COLONOSCOPY  2015   Colitis   DILATION AND CURETTAGE OF UTERUS  2002   HERNIA REPAIR  01/2013   Kidney and Bladder Stent and tube placed Right 03/2013   PARTIAL NEPHRECTOMY Right 02/2013   Tumor Removal    Procedure to remove kidney and bladder stent and tube Right 04/2013   TOTAL ABDOMINAL HYSTERECTOMY W/ BILATERAL SALPINGOOPHORECTOMY  2002   Secondary to AUB    Current Medications: Current Meds  Medication Sig   ALPRAZolam (XANAX) 0.25 MG tablet Take 1 tablet by mouth 2 (two) times daily as needed.   atorvastatin (LIPITOR) 20 MG tablet Take 1 tablet (20 mg total) by mouth daily.   EPINEPHrine 0.3 mg/0.3 mL IJ SOAJ injection Inject 0.3 mLs into the skin as needed.   famotidine (PEPCID) 10 MG tablet Take 10 mg by mouth daily.   furosemide (LASIX) 20 MG tablet Take 1 tablet (20 mg total) by mouth daily.   glipiZIDE (GLUCOTROL XL) 10 MG 24 hr tablet Take 10 mg by mouth daily.   hydrOXYzine (ATARAX/VISTARIL) 25 MG tablet Take 25 mg by mouth as needed.   Insulin Glargine (BASAGLAR KWIKPEN) 100 UNIT/ML SOPN Inject 14 Units  into the skin daily.   mesalamine (LIALDA) 1.2 G EC tablet Take 1.2 g by mouth daily.    metoprolol succinate (TOPROL-XL) 25 MG 24 hr tablet Take 0.5 tablets (12.5 mg total) by mouth daily.   ramipril (ALTACE) 2.5 MG capsule Take 1 capsule (2.5 mg total) by mouth daily.   rivaroxaban (XARELTO) 20 MG TABS tablet Take 1 tablet (20 mg total) by mouth daily. Please schedule yearly appt for anymore refills, thanks! [phone] 1st Attempt   TRULICITY 1.5 0000000 SOPN Inject 1.5 mg into the skin every 7 (seven) days.       Allergies:   Orange fruit [citrus], Bactrim [sulfamethoxazole-trimethoprim], and Tape   Social History   Socioeconomic History   Marital status: Married    Spouse name: Not on file   Number of children: Not on file   Years of education: Not on file   Highest education level: Not on file  Occupational History   Not on file  Social Needs   Financial resource strain: Not on file   Food insecurity    Worry: Not on file    Inability: Not on file   Transportation needs    Medical: Not on file    Non-medical: Not on file  Tobacco Use   Smoking status: Never Smoker   Smokeless tobacco: Never Used  Substance and Sexual Activity   Alcohol use: Yes    Comment: 2 a month   Drug use: No   Sexual activity: Yes    Partners: Male    Birth control/protection: Surgical    Comment: TAH/BSO  Lifestyle   Physical activity    Days per week: Not on file    Minutes per session: Not on file   Stress: Not on file  Relationships   Social connections    Talks on phone: Not on file    Gets together: Not on file    Attends religious service: Not on file    Active member of club or organization: Not on file    Attends meetings of clubs or organizations: Not on file    Relationship status: Not on file  Other Topics Concern   Not on file  Social History Narrative   Not on file     Family History:  The patient's family history includes Cancer in her  maternal grandmother and paternal aunt; Diabetes in her father, maternal grandmother, mother, and paternal aunt; Fibromyalgia in her mother; Heart disease in her father; Hypertension in her father, mother, and sister. There is no history of Breast cancer.  ROS:   Please see the history of present illness.  All other systems are reviewed and otherwise negative.    EKGs/Labs/Other Studies Reviewed:    Studies reviewed were summarized above.   COR CTA 05/13/19 IMPRESSION: 1. Coronary calcium score of 2924. This was 99th percentile for age and sex matched control.  2. Normal coronary origin with right dominance.  3. Concerns for a chronic total occlusion (100%) of the mRCA with L to R collaterals as described above. 4. Concerns for moderate proximal and mid LAD (50-69%) stenoses with small vessel disease in the diagonal branches.  5. Mild calcified plaque (25-49%) in the LCX/OM branches.  FFR REPORT 05/13/19 1. Left Main: 0.98; No significant stenosis.  2. LAD: 0.87; no significant stenosis. 3. LCX: 0.86; no significant stenosis. 4. RCA: 0.70; significant stenosis.  IMPRESSION: 1. CT FFR analysis didn't show any significant stenosis in the LAD/LCX  2.  Concerns remain for CTO of mRCA as reported in CCTA report.  EKG:  EKG is not ordered today but reviewed from prior visit.  Recent Labs: 05/15/2019: BUN 17; Creatinine, Ser 1.14; NT-Pro BNP 382; Potassium 4.4; Sodium 137  Summarized above  PHYSICAL EXAM:    VS:  BP 118/70    Pulse 70    Ht 5\' 5"  (1.651 m)    Wt 205 lb 12.8 oz (93.4 kg)    LMP 07/16/2000 (Approximate)    SpO2 99%    BMI 34.25 kg/m   BMI: Body mass index is 34.25 kg/m.  GEN: Well nourished, well developed obs WF, in no acute distress HEENT: normocephalic, atraumatic Neck: no JVD, carotid bruits, or masses Cardiac: RRR; no murmurs, rubs, or gallops, no edema  Respiratory:  clear to auscultation bilaterally, normal work of breathing GI: soft,  nontender, nondistended, + BS MS: no deformity or atrophy Skin: warm and dry, no rash Neuro:  Alert and Oriented x 3, Strength and sensation are intact, follows commands Psych: euthymic mood, full affect  Wt Readings from Last 3 Encounters:  05/19/19 205 lb 12.8 oz (93.4 kg)  04/28/19 210 lb (95.3 kg)  05/04/18 205 lb (93 kg)     ASSESSMENT & PLAN:   1. CAD by cath with potential angina - discussed with Dr. Radford Pax. Will plan R/LHC with one of our physicians that performs CTO procedures so that they can weigh in whether her anatomy would be suitable for CTO intervention going forward if needed. There is no availability on Friday but there is availability on Thursday with Dr. Irish Lack. She will have Covid testing today and quarantine after. (She has been very strict about quarantining in general given her health history.) Will update CBC and trend BMET given Lasix. Discussed timing of Xarelto with cath lab team - she took her Xarelto already today so will not take any tomorrow or day of cath. Per discussion with Dr. Radford Pax, hold off standing dose of ASA until cath results are known (will order for pre-cath dose AM of cath in short stay per standard orders). She will hold her Lasix day of the cath and adjust her DM meds as outlined on AVS. Risks and benefits of cardiac catheterization have been discussed with the patient.  These include bleeding, infection, kidney damage, stroke, heart attack, death.  The patient understands these risks and is willing to proceed. 2. Paroxysmal atrial fibrillation - maintaining NSR by exam. See above regarding Xarelto. Recent Hgb stable. 3. Possible chronic diastolic CHF - she appears generally euvolemic by exam today. Per d/w Dr. Radford Pax, plan RHC at time of cath. 4. Hyperlipidemia - recent lipids controlled. Can consider titrating atorvastatin at time of cath.   Disposition: F/u with Dr. Abbey Chatters in 2 weeks post-cath. She prefers in person visits over virtual. The  option of virtual was a source of anxiety for her over this past year.  Medication Adjustments/Labs and Tests Ordered: Current medicines are reviewed at length with the patient today.  Concerns regarding medicines are outlined above. Medication changes, Labs and Tests ordered today are summarized above and listed in the Patient Instructions accessible in Encounters.   Signed, Charlie Pitter, PA-C  05/19/2019 12:43 PM    [facility] [address], [place], [place]  [address] Phone: [phone]; Fax: [phone]

## 2019-05-17 NOTE — Progress Notes (Signed)
Cardiology Office Note    Date:  05/19/2019   ID:  Courtney Olson, DOB 24-Nov-1965, MRN [id number]  PCP:  Judge Stall, [place] (Inactive)  Cardiologist:  Fransico Him, MD  Electrophysiologist:  None   Chief Complaint: discuss cath result  History of Present Illness:   Courtney Olson is a 53 y.o. female with history of HTN, DM, paroxysmal atrial fibrillation on Xarelto, vasovagal syncope, pancolitis, calcifications of the abdominal aorta by CT, CMV hepatitis, hyperlipidemia, OSA, RA, GERD, obesity, bradycardia, pancreatitis, suspected mild renal insufficiency (?CKD stage II based on labs) who presents to discuss cath at the request of Dr. Radford Pax due to concern for CAD.  She has a remote history of chest pain with negative workup with stress test and echo. Nuc 07/2012 was normal. 2D echo 03/2014 showed normal EF, grade 1 DD. ETT 04/2014 was normal. She has had pancreatitis and was diagnosed with a kidney mass and underwent a large resection of a benign mass complicated by multiple infections. In July 2015 she had an episode of syncope following severe abdominal pain, felt vasovagal in nature. She actually reported over 40 episodes of syncope in the last 10 years. These initially began in the setting of anaphylactic reactions but in more recent years have followed acute abdominal pain related to her ulcerative colitis. It has been felt that prevention of the GI pain was the key to avoiding further syncope. Cardiac monitor 05/2016 showed NSR (sinus brady-sinus tach 51-127bpm), average HR 73bpm, isoalted atrial couplet.   She recently saw Dr. Radford Pax on 04/28/19 for evaluation of DOE, exertional fatigue, chest tightness and palpitations. She thought that she had COVID back in January. She was on a cruise ship and the day she got off she became ill with fever, chills, body aches, cough and SOB which lasted over a week. Multiple people from her group became ill and none of them tested positive for the flu or strep.  Prior to getting sick, she was walking at least 10-15K steps and after being sick she has become so fatigued that she can only walk 57 steps without getting DOE and fatigue. She has also had intermittent cough. She was noticing high blood pressure over the summer but in the context of anxiety over waiting for an in-person appointment. She had also noticed some decreased BPs in the 0000000 systolic after exercise. 2D echo 05/06/19 showed EF 60-65%, impaired diastolic relaxation, no acute abnormalities. BNP 05/07/19 at was mildly elevated at 250 so HCTZ was stopped and she was started on Lasix. Coronary CTA 05/13/19 was abnormal as outlined below with concern for CTO of the RCA and moderate prox-mid LAD disease. Dr. Radford Pax recommended she be seen back to discuss cardiac catheterization. Otherwise f/u Epic labs 05/15/19 showed BNP 382, K 4.4, Cr 1.14 (baseline Cr appearing 0.9-1.2), glucose 238. [facility] labs 05/04/19 showed Tchol 158, HDL 62, LDL 69, trig 135, A1C 8.1, Hgb 13.4, ALT 24.  She is seen back for follow-up overall stable. She came in with numerous questions today which I tried my best to answer. She has felt as though she's gotten conflicting information speaking with numerous people in our office about her results the last few weeks (fluid around heart, fluid in lungs, etc). She googled the BNP level and was extremely concerned seeing statistics about mortality rate and CHF. The exertional dyspnea is no worse than when it started back in January (no unstable acute symptoms), but still a definite change from her baseline. She also notices that even  something as simple as taking a shower and getting cleaned up results in significant fatigue. This is frustrating to her as she was quite active before. No anginal type chest pain or orthopnea. She did notice a period of time where she had pain between her shoulder blades for 2 weeks but felt it was related to mattress.   Past Medical History:  Diagnosis Date    Aortic calcification (HCC)    Atrophic vaginitis 01/2006   Bradycardia 05/2014   CAD (coronary artery disease)    a. noted on cor CT.   Chronic pancolonic ulcerative colitis ([place]) 2015   unknown if chronic.   CKD (chronic kidney disease), stage II    Cytomegaloviral hepatitis ([place]) 2005   Diabetes mellitus without complication ([place]) 99991111   on Ace inhibitor to protect kidneys   Gallstones 01/2013   GERD (gastroesophageal reflux disease)    Hyperlipidemia    Hypertension    Kidney tumor 03/2013   Right   Obesity    OSA (obstructive sleep apnea)    PAF (paroxysmal atrial fibrillation) ([place])    Pancreatitis 2014   Postmenopausal    RA (rheumatoid arthritis) ([place])    Syncope and collapse    Ulcerative colitis ([place]) 2015    Past Surgical History:  Procedure Laterality Date   CESAREAN SECTION     CHOLECYSTECTOMY  01/2013   COLONOSCOPY  2015   Colitis   DILATION AND CURETTAGE OF UTERUS  2002   HERNIA REPAIR  01/2013   Kidney and Bladder Stent and tube placed Right 03/2013   PARTIAL NEPHRECTOMY Right 02/2013   Tumor Removal    Procedure to remove kidney and bladder stent and tube Right 04/2013   TOTAL ABDOMINAL HYSTERECTOMY W/ BILATERAL SALPINGOOPHORECTOMY  2002   Secondary to AUB    Current Medications: Current Meds  Medication Sig   ALPRAZolam (XANAX) 0.25 MG tablet Take 1 tablet by mouth 2 (two) times daily as needed.   atorvastatin (LIPITOR) 20 MG tablet Take 1 tablet (20 mg total) by mouth daily.   EPINEPHrine 0.3 mg/0.3 mL IJ SOAJ injection Inject 0.3 mLs into the skin as needed.   famotidine (PEPCID) 10 MG tablet Take 10 mg by mouth daily.   furosemide (LASIX) 20 MG tablet Take 1 tablet (20 mg total) by mouth daily.   glipiZIDE (GLUCOTROL XL) 10 MG 24 hr tablet Take 10 mg by mouth daily.   hydrOXYzine (ATARAX/VISTARIL) 25 MG tablet Take 25 mg by mouth as needed.   Insulin Glargine (BASAGLAR KWIKPEN) 100 UNIT/ML SOPN Inject 14 Units  into the skin daily.   mesalamine (LIALDA) 1.2 G EC tablet Take 1.2 g by mouth daily.    metoprolol succinate (TOPROL-XL) 25 MG 24 hr tablet Take 0.5 tablets (12.5 mg total) by mouth daily.   ramipril (ALTACE) 2.5 MG capsule Take 1 capsule (2.5 mg total) by mouth daily.   rivaroxaban (XARELTO) 20 MG TABS tablet Take 1 tablet (20 mg total) by mouth daily. Please schedule yearly appt for anymore refills, thanks! [phone] 1st Attempt   TRULICITY 1.5 0000000 SOPN Inject 1.5 mg into the skin every 7 (seven) days.       Allergies:   Orange fruit [citrus], Bactrim [sulfamethoxazole-trimethoprim], and Tape   Social History   Socioeconomic History   Marital status: Married    Spouse name: Not on file   Number of children: Not on file   Years of education: Not on file   Highest education level: Not on file  Occupational History   Not on file  Social Needs   Financial resource strain: Not on file   Food insecurity    Worry: Not on file    Inability: Not on file   Transportation needs    Medical: Not on file    Non-medical: Not on file  Tobacco Use   Smoking status: Never Smoker   Smokeless tobacco: Never Used  Substance and Sexual Activity   Alcohol use: Yes    Comment: 2 a month   Drug use: No   Sexual activity: Yes    Partners: Male    Birth control/protection: Surgical    Comment: TAH/BSO  Lifestyle   Physical activity    Days per week: Not on file    Minutes per session: Not on file   Stress: Not on file  Relationships   Social connections    Talks on phone: Not on file    Gets together: Not on file    Attends religious service: Not on file    Active member of club or organization: Not on file    Attends meetings of clubs or organizations: Not on file    Relationship status: Not on file  Other Topics Concern   Not on file  Social History Narrative   Not on file     Family History:  The patient's family history includes Cancer in her  maternal grandmother and paternal aunt; Diabetes in her father, maternal grandmother, mother, and paternal aunt; Fibromyalgia in her mother; Heart disease in her father; Hypertension in her father, mother, and sister. There is no history of Breast cancer.  ROS:   Please see the history of present illness.  All other systems are reviewed and otherwise negative.    EKGs/Labs/Other Studies Reviewed:    Studies reviewed were summarized above.   COR CTA 05/13/19 IMPRESSION: 1. Coronary calcium score of 2924. This was 99th percentile for age and sex matched control.  2. Normal coronary origin with right dominance.  3. Concerns for a chronic total occlusion (100%) of the mRCA with L to R collaterals as described above. 4. Concerns for moderate proximal and mid LAD (50-69%) stenoses with small vessel disease in the diagonal branches.  5. Mild calcified plaque (25-49%) in the LCX/OM branches.  FFR REPORT 05/13/19 1. Left Main: 0.98; No significant stenosis.  2. LAD: 0.87; no significant stenosis. 3. LCX: 0.86; no significant stenosis. 4. RCA: 0.70; significant stenosis.  IMPRESSION: 1. CT FFR analysis didn't show any significant stenosis in the LAD/LCX  2.  Concerns remain for CTO of mRCA as reported in CCTA report.  EKG:  EKG is not ordered today but reviewed from prior visit.  Recent Labs: 05/15/2019: BUN 17; Creatinine, Ser 1.14; NT-Pro BNP 382; Potassium 4.4; Sodium 137  Summarized above  PHYSICAL EXAM:    VS:  BP 118/70    Pulse 70    Ht 5\' 5"  (1.651 m)    Wt 205 lb 12.8 oz (93.4 kg)    LMP 07/16/2000 (Approximate)    SpO2 99%    BMI 34.25 kg/m   BMI: Body mass index is 34.25 kg/m.  GEN: Well nourished, well developed obs WF, in no acute distress HEENT: normocephalic, atraumatic Neck: no JVD, carotid bruits, or masses Cardiac: RRR; no murmurs, rubs, or gallops, no edema  Respiratory:  clear to auscultation bilaterally, normal work of breathing GI: soft,  nontender, nondistended, + BS MS: no deformity or atrophy Skin: warm and dry, no rash Neuro:  Alert and Oriented x 3, Strength and sensation are intact, follows commands Psych: euthymic mood, full affect  Wt Readings from Last 3 Encounters:  05/19/19 205 lb 12.8 oz (93.4 kg)  04/28/19 210 lb (95.3 kg)  05/04/18 205 lb (93 kg)     ASSESSMENT & PLAN:   1. CAD by cath with potential angina - discussed with Dr. Radford Pax. Will plan R/LHC with one of our physicians that performs CTO procedures so that they can weigh in whether her anatomy would be suitable for CTO intervention going forward if needed. There is no availability on Friday but there is availability on Thursday with Dr. Irish Lack. She will have Covid testing today and quarantine after. (She has been very strict about quarantining in general given her health history.) Will update CBC and trend BMET given Lasix. Discussed timing of Xarelto with cath lab team - she took her Xarelto already today so will not take any tomorrow or day of cath. Per discussion with Dr. Radford Pax, hold off standing dose of ASA until cath results are known (will order for pre-cath dose AM of cath in short stay per standard orders). She will hold her Lasix day of the cath and adjust her DM meds as outlined on AVS. Risks and benefits of cardiac catheterization have been discussed with the patient.  These include bleeding, infection, kidney damage, stroke, heart attack, death.  The patient understands these risks and is willing to proceed. 2. Paroxysmal atrial fibrillation - maintaining NSR by exam. See above regarding Xarelto. Recent Hgb stable. 3. Possible chronic diastolic CHF - she appears generally euvolemic by exam today. Per d/w Dr. Radford Pax, plan RHC at time of cath. 4. Hyperlipidemia - recent lipids controlled. Can consider titrating atorvastatin at time of cath.   Disposition: F/u with Dr. Abbey Chatters in 2 weeks post-cath. She prefers in person visits over virtual. The  option of virtual was a source of anxiety for her over this past year.  Medication Adjustments/Labs and Tests Ordered: Current medicines are reviewed at length with the patient today.  Concerns regarding medicines are outlined above. Medication changes, Labs and Tests ordered today are summarized above and listed in the Patient Instructions accessible in Encounters.   Signed, Charlie Pitter, PA-C  05/19/2019 12:43 PM    [facility] [place], [place],   [address] Phone: [phone]; Fax: [phone]

## 2019-05-19 ENCOUNTER — Encounter: Payer: Self-pay | Admitting: Physician Assistant

## 2019-05-19 ENCOUNTER — Ambulatory Visit (INDEPENDENT_AMBULATORY_CARE_PROVIDER_SITE_OTHER): Payer: 59 | Admitting: Physician Assistant

## 2019-05-19 ENCOUNTER — Other Ambulatory Visit: Payer: Self-pay

## 2019-05-19 ENCOUNTER — Other Ambulatory Visit (HOSPITAL_COMMUNITY)
Admission: RE | Admit: 2019-05-19 | Discharge: 2019-05-19 | Disposition: A | Discharge status: Home / Self Care | Payer: 59 | Source: Ambulatory Visit | Attending: Cardiology | Admitting: Cardiology

## 2019-05-19 VITALS — BP 118/70 | HR 70 | Ht 65.0 in | Wt 205.8 lb

## 2019-05-19 DIAGNOSIS — Z01812 Encounter for preprocedural laboratory examination: Secondary | ICD-10-CM | POA: Insufficient documentation

## 2019-05-19 DIAGNOSIS — Z20828 Contact with and (suspected) exposure to other viral communicable diseases: Secondary | ICD-10-CM | POA: Insufficient documentation

## 2019-05-19 DIAGNOSIS — R0600 Dyspnea, unspecified: Secondary | ICD-10-CM | POA: Diagnosis not present

## 2019-05-19 DIAGNOSIS — I2510 Atherosclerotic heart disease of native coronary artery without angina pectoris: Secondary | ICD-10-CM

## 2019-05-19 DIAGNOSIS — I480 Paroxysmal atrial fibrillation: Secondary | ICD-10-CM

## 2019-05-19 DIAGNOSIS — E785 Hyperlipidemia, unspecified: Secondary | ICD-10-CM

## 2019-05-19 DIAGNOSIS — R0609 Other forms of dyspnea: Secondary | ICD-10-CM

## 2019-05-19 DIAGNOSIS — I5032 Chronic diastolic (congestive) heart failure: Secondary | ICD-10-CM

## 2019-05-19 LAB — SARS CORONAVIRUS 2 (TAT 6-24 HRS): SARS Coronavirus 2: NEGATIVE

## 2019-05-19 MED ORDER — SODIUM CHLORIDE 0.9% FLUSH: 3.0000 mL | Freq: Two times a day (BID) | INTRAVENOUS | Status: DC

## 2019-05-19 NOTE — Patient Instructions (Addendum)
Medication Instructions:  Your physician recommends that you continue on your current medications as directed. Please refer to the Current Medication list given to you today.  *If you need a refill on your cardiac medications before your next appointment, please call your pharmacy*  Lab Work: TODAY:  BMET & CBC  If you have labs (blood work) drawn today and your tests are completely normal, you will receive your results only by: Marland Kitchen MyChart Message (if you have MyChart) OR . A paper copy in the mail If you have any lab test that is abnormal or we need to change your treatment, we will call you to review the results.  Testing/Procedures: Your physician has requested that you have a cardiac catheterization. Cardiac catheterization is used to diagnose and/or treat various heart conditions. Doctors may recommend this procedure for a number of different reasons. The most common reason is to evaluate chest pain. Chest pain can be a symptom of coronary artery disease (CAD), and cardiac catheterization can show whether plaque is narrowing or blocking your heart's arteries. This procedure is also used to evaluate the valves, as well as measure the blood flow and oxygen levels in different parts of your heart. For further information please visit [email or web address]. Please follow instruction sheet, BELOW:     [facility] OFFICE [place], [place] [address] Dept: [phone] Loc: [place]  05/19/2019  You are scheduled for a Cardiac Catheterization on Thursday, November 5 with Dr. Larae Grooms.  1. Please arrive at the [facility] (Main Entrance A) at [facility]: [address] [place], [place] [address] at 5:30 AM (This time is two hours before your procedure to ensure your preparation). Free valet parking service is available.   Special note: Every effort is made to have  your procedure done on time. Please understand that emergencies sometimes delay scheduled procedures.  2. Diet: Do not eat solid foods after midnight.  The patient may have clear liquids until 5am upon the day of the procedure.  3. Labs: You will need to have blood drawn on TODAY here YOU ALSO NEED TO GO TO [facility], [address] FOR COVID TESTING TODAY:  MAKE SURE YOU GO UNDER LANE WITH THE BRICK AWNING.  ONCE YOU DO THIS, YOU WILL NEED TO GO HOME AND QUARANTINE UNTIL Thursday WHEN YOU GO FOR YOUR PROCEDURE.  THIS MEANS NO OUTSIDE GUESTS IN [facility]. 4. Medication instructions in preparation for your procedure:   Contrast Allergy: No    Stop taking Xarelto (Rivaroxaban) on Monday, November 3.  Stop taking, Lasix (Furosemide)  Thursday, November 5,  Take only 7 units of insulin the night before your procedure. Do not take any insulin on the day of the procedure.  Do not take Diabetes Med GLIPIZIDE on the day of the procedure and HOLD 48 HOURS AFTER THE PROCEDURE.  On the morning of your procedure, take your Aspirin and any morning medicines NOT listed above.  You may use sips of water.  5. Plan for one night stay--bring personal belongings. 6. Bring a current list of your medications and current insurance cards. 7. You MUST have a responsible person to drive you home. 8. Someone MUST be with you the first 24 hours after you arrive home or your discharge will be delayed. 9. Please wear clothes that are easy to get on and off and wear slip-on shoes.  Thank you  for allowing [place] to care for you!   -- [facility].    Follow-Up: At [facility], you and your health needs are our priority.  As part of our continuing mission to provide you with exceptional heart care, we have created designated Provider Care Teams.  These Care Teams include your primary Cardiologist (physician) and Advanced Practice Providers (APPs -  Physician Assistants and  Nurse Practitioners) who all work together to provide you with the care you need, when you need it.  Your next appointment:   06/04/2019  The format for your next appointment:   In Person  Provider:   Fransico Him, MD  Other Instructions:

## 2019-05-20 ENCOUNTER — Telehealth: Payer: Self-pay | Admitting: *Deleted

## 2019-05-20 LAB — CBC
Hematocrit: 42.1 % (ref 34.0–46.6)
Hemoglobin: 13.8 g/dL (ref 11.1–15.9)
MCH: 29.1 pg (ref 26.6–33.0)
MCHC: 32.8 g/dL (ref 31.5–35.7)
MCV: 89 fL (ref 79–97)
Platelets: 201 10*3/uL (ref 150–450)
RBC: 4.74 x10E6/uL (ref 3.77–5.28)
RDW: 14.2 % (ref 11.7–15.4)
WBC: 7 10*3/uL (ref 3.4–10.8)

## 2019-05-20 LAB — BASIC METABOLIC PANEL
BUN/Creatinine Ratio: 19 (ref 9–23)
BUN: 23 mg/dL (ref 6–24)
CO2: 26 mmol/L (ref 20–29)
Calcium: 9.8 mg/dL (ref 8.7–10.2)
Chloride: 97 mmol/L (ref 96–106)
Creatinine, Ser: 1.2 mg/dL — ABNORMAL HIGH (ref 0.57–1.00)
GFR calc Af Amer: 60 mL/min/{1.73_m2} (ref >59)
GFR calc non Af Amer: 52 mL/min/{1.73_m2} — ABNORMAL LOW (ref >59)
Glucose: 297 mg/dL — ABNORMAL HIGH (ref 65–99)
Potassium: 4.8 mmol/L (ref 3.5–5.2)
Sodium: 140 mmol/L (ref 134–144)

## 2019-05-20 NOTE — Telephone Encounter (Signed)
Pt returned the call and she has been made aware of her lab results and recommendations. See result note.

## 2019-05-20 NOTE — Telephone Encounter (Signed)
Call placed to pt re: lab results, left a message for her to call back.  

## 2019-05-20 NOTE — Telephone Encounter (Addendum)
Pt contacted pre-catheterization scheduled at [facility] for: Thursday May 21, 2019 7:30 AM Verified arrival time and place: [facility] at: 5:30 AM   No solid food after midnight prior to cath, clear liquids until 5 AM day of procedure. Contrast allergy: no  Hold: Lasix-now until post cath Glipizide-AM of procedure. Xarelto-none 05/20/19 until post cath. Insulin-AM of procedure. 1/2 Insulin PM prior to procedure.   Except hold medications AM meds can be  taken pre-cath with sip of water including: ASA 81 mg   Confirmed patient has responsible adult to drive home post procedure and observe 24 hours after arriving home: yes  Currently, due to Covid-19 pandemic, only one support person will be allowed with patient. Must be the same support person for that patient's entire stay, will be screened and required to wear a mask. They will be asked to wait in the waiting room for the duration of the patient's stay.  Patients are required to wear a mask when they enter the hospital.      COVID-19 Pre-Screening Questions:  . In the past 7 to 10 days have you had a cough,  shortness of breath, headache, congestion, fever (100 or greater) body aches, chills, sore throat, or sudden loss of taste or sense of smell? no . Have you been around anyone with known Covid 19? no . Have you been around anyone who is awaiting Covid 19 test results in the past 7 to 10 days? no . Have you been around anyone who has been exposed to Covid 19, or has mentioned symptoms of Covid 19 within the past 7 to 10 days? no   I reviewed procedure/mask/visitor instructions, Covid-19 screening questions with patient, she verbalized understanding, thanked me for call.

## 2019-05-20 NOTE — Telephone Encounter (Signed)
-----   Message from Charlie Pitter, PA-C sent at 05/20/2019  8:33 AM EST ----- Please let pt know CBC stable. Kidney function appears mildly elevated but similar to prior baseline of 0.9-1.2. I would hold off on further Lasix altogether for now pending cath as planned. Dayna Dunn PA-C

## 2019-05-20 NOTE — Telephone Encounter (Signed)
Follow Up:; ° ° °Returning your call. °

## 2019-05-21 ENCOUNTER — Encounter (HOSPITAL_COMMUNITY): Payer: Self-pay | Admitting: Interventional Cardiology

## 2019-05-21 ENCOUNTER — Encounter (HOSPITAL_COMMUNITY)
Admission: RE | Disposition: A | Discharge status: Home / Self Care | Payer: Self-pay | Source: Home / Self Care | Attending: Interventional Cardiology

## 2019-05-21 ENCOUNTER — Other Ambulatory Visit: Payer: Self-pay

## 2019-05-21 ENCOUNTER — Ambulatory Visit (HOSPITAL_BASED_OUTPATIENT_CLINIC_OR_DEPARTMENT_OTHER): Payer: 59

## 2019-05-21 ENCOUNTER — Ambulatory Visit (HOSPITAL_COMMUNITY)
Admission: RE | Admit: 2019-05-21 | Discharge: 2019-05-21 | Disposition: A | Discharge status: Home / Self Care | Payer: 59 | Attending: Interventional Cardiology | Admitting: Interventional Cardiology

## 2019-05-21 DIAGNOSIS — I509 Heart failure, unspecified: Secondary | ICD-10-CM | POA: Diagnosis not present

## 2019-05-21 DIAGNOSIS — Z79899 Other long term (current) drug therapy: Secondary | ICD-10-CM | POA: Insufficient documentation

## 2019-05-21 DIAGNOSIS — M069 Rheumatoid arthritis, unspecified: Secondary | ICD-10-CM | POA: Insufficient documentation

## 2019-05-21 DIAGNOSIS — E669 Obesity, unspecified: Secondary | ICD-10-CM | POA: Insufficient documentation

## 2019-05-21 DIAGNOSIS — I480 Paroxysmal atrial fibrillation: Secondary | ICD-10-CM | POA: Diagnosis present

## 2019-05-21 DIAGNOSIS — Z6833 Body mass index (BMI) 33.0-33.9, adult: Secondary | ICD-10-CM | POA: Insufficient documentation

## 2019-05-21 DIAGNOSIS — I25119 Atherosclerotic heart disease of native coronary artery with unspecified angina pectoris: Secondary | ICD-10-CM | POA: Insufficient documentation

## 2019-05-21 DIAGNOSIS — K219 Gastro-esophageal reflux disease without esophagitis: Secondary | ICD-10-CM | POA: Diagnosis not present

## 2019-05-21 DIAGNOSIS — Z888 Allergy status to other drugs, medicaments and biological substances status: Secondary | ICD-10-CM | POA: Diagnosis not present

## 2019-05-21 DIAGNOSIS — E785 Hyperlipidemia, unspecified: Secondary | ICD-10-CM | POA: Diagnosis not present

## 2019-05-21 DIAGNOSIS — Z7901 Long term (current) use of anticoagulants: Secondary | ICD-10-CM | POA: Insufficient documentation

## 2019-05-21 DIAGNOSIS — N182 Chronic kidney disease, stage 2 (mild): Secondary | ICD-10-CM | POA: Diagnosis not present

## 2019-05-21 DIAGNOSIS — I2510 Atherosclerotic heart disease of native coronary artery without angina pectoris: Secondary | ICD-10-CM

## 2019-05-21 DIAGNOSIS — E1122 Type 2 diabetes mellitus with diabetic chronic kidney disease: Secondary | ICD-10-CM | POA: Diagnosis not present

## 2019-05-21 DIAGNOSIS — R0609 Other forms of dyspnea: Secondary | ICD-10-CM

## 2019-05-21 DIAGNOSIS — I25118 Atherosclerotic heart disease of native coronary artery with other forms of angina pectoris: Secondary | ICD-10-CM | POA: Diagnosis not present

## 2019-05-21 DIAGNOSIS — I130 Hypertensive heart and chronic kidney disease with heart failure and stage 1 through stage 4 chronic kidney disease, or unspecified chronic kidney disease: Secondary | ICD-10-CM | POA: Insufficient documentation

## 2019-05-21 DIAGNOSIS — F419 Anxiety disorder, unspecified: Secondary | ICD-10-CM | POA: Diagnosis not present

## 2019-05-21 DIAGNOSIS — Z95820 Peripheral vascular angioplasty status with implants and grafts: Secondary | ICD-10-CM

## 2019-05-21 DIAGNOSIS — I252 Old myocardial infarction: Secondary | ICD-10-CM | POA: Insufficient documentation

## 2019-05-21 DIAGNOSIS — Z794 Long term (current) use of insulin: Secondary | ICD-10-CM | POA: Insufficient documentation

## 2019-05-21 DIAGNOSIS — Z881 Allergy status to other antibiotic agents status: Secondary | ICD-10-CM | POA: Diagnosis not present

## 2019-05-21 DIAGNOSIS — G4733 Obstructive sleep apnea (adult) (pediatric): Secondary | ICD-10-CM | POA: Diagnosis not present

## 2019-05-21 DIAGNOSIS — R0600 Dyspnea, unspecified: Secondary | ICD-10-CM

## 2019-05-21 DIAGNOSIS — I25709 Atherosclerosis of coronary artery bypass graft(s), unspecified, with unspecified angina pectoris: Secondary | ICD-10-CM

## 2019-05-21 HISTORY — PX: RIGHT/LEFT HEART CATH AND CORONARY ANGIOGRAPHY: CATH118266

## 2019-05-21 HISTORY — PX: CORONARY STENT INTERVENTION: CATH118234

## 2019-05-21 LAB — POCT I-STAT EG7
Acid-Base Excess: 1 mmol/L (ref 0.0–2.0)
Bicarbonate: 27.9 mmol/L (ref 20.0–28.0)
Calcium, Ion: 1.25 mmol/L (ref 1.15–1.40)
HCT: 33 % — ABNORMAL LOW (ref 36.0–46.0)
Hemoglobin: 11.2 g/dL — ABNORMAL LOW (ref 12.0–15.0)
O2 Saturation: 76 %
Potassium: 3.7 mmol/L (ref 3.5–5.1)
Sodium: 137 mmol/L (ref 135–145)
TCO2: 29 mmol/L (ref 22–32)
pCO2, Ven: 52.1 mmHg (ref 44.0–60.0)
pH, Ven: 7.337 (ref 7.250–7.430)
pO2, Ven: 44 mmHg (ref 32.0–45.0)

## 2019-05-21 LAB — POCT I-STAT 7, (LYTES, BLD GAS, ICA,H+H)
Bicarbonate: 26.7 mmol/L (ref 20.0–28.0)
Calcium, Ion: 1.24 mmol/L (ref 1.15–1.40)
HCT: 33 % — ABNORMAL LOW (ref 36.0–46.0)
Hemoglobin: 11.2 g/dL — ABNORMAL LOW (ref 12.0–15.0)
O2 Saturation: 99 %
Potassium: 3.8 mmol/L (ref 3.5–5.1)
Sodium: 137 mmol/L (ref 135–145)
TCO2: 28 mmol/L (ref 22–32)
pCO2 arterial: 49.9 mmHg — ABNORMAL HIGH (ref 32.0–48.0)
pH, Arterial: 7.336 — ABNORMAL LOW (ref 7.350–7.450)
pO2, Arterial: 154 mmHg — ABNORMAL HIGH (ref 83.0–108.0)

## 2019-05-21 LAB — GLUCOSE, CAPILLARY: Glucose-Capillary: 104 mg/dL — ABNORMAL HIGH (ref 70–99)

## 2019-05-21 LAB — POCT ACTIVATED CLOTTING TIME
Activated Clotting Time: 345 seconds
Activated Clotting Time: 323 seconds

## 2019-05-21 SURGERY — RIGHT/LEFT HEART CATH AND CORONARY ANGIOGRAPHY
Anesthesia: LOCAL

## 2019-05-21 MED ORDER — FUROSEMIDE 20 MG PO TABS: 20.0000 mg | ORAL_TABLET | Freq: Every day | ORAL | Status: DC

## 2019-05-21 MED ORDER — ATORVASTATIN CALCIUM 10 MG PO TABS: 20.0000 mg | ORAL_TABLET | Freq: Every day | ORAL | Status: DC

## 2019-05-21 MED ORDER — SODIUM CHLORIDE 0.9% FLUSH: 3.0000 mL | Freq: Two times a day (BID) | INTRAVENOUS | Status: DC

## 2019-05-21 MED ORDER — GLIPIZIDE ER 10 MG PO TB24: 10.0000 mg | ORAL_TABLET | Freq: Every day | ORAL | Status: DC

## 2019-05-21 MED ORDER — FENTANYL CITRATE (PF) 100 MCG/2ML IJ SOLN
INTRAMUSCULAR | Status: AC
  Filled 2019-05-21: qty 2

## 2019-05-21 MED ORDER — MIDAZOLAM HCL 2 MG/2ML IJ SOLN
INTRAMUSCULAR | Status: AC
  Filled 2019-05-21: qty 2

## 2019-05-21 MED ORDER — SODIUM CHLORIDE 0.9 % IV SOLN: 250.0000 mL | INTRAVENOUS | Status: DC | PRN

## 2019-05-21 MED ORDER — BASAGLAR KWIKPEN 100 UNIT/ML ~~LOC~~ SOPN: 14.0000 [IU] | PEN_INJECTOR | Freq: Every day | SUBCUTANEOUS | Status: DC

## 2019-05-21 MED ORDER — ALPRAZOLAM 0.25 MG PO TABS: 0.2500 mg | ORAL_TABLET | Freq: Two times a day (BID) | ORAL | Status: DC | PRN

## 2019-05-21 MED ORDER — FAMOTIDINE 10 MG PO TABS: 10.0000 mg | ORAL_TABLET | Freq: Every day | ORAL | Status: DC

## 2019-05-21 MED ORDER — SODIUM CHLORIDE 0.9% FLUSH: 3.0000 mL | INTRAVENOUS | Status: DC | PRN

## 2019-05-21 MED ORDER — HEPARIN SODIUM (PORCINE) 1000 UNIT/ML IJ SOLN
INTRAMUSCULAR | Status: AC
  Filled 2019-05-21: qty 1

## 2019-05-21 MED ORDER — IOHEXOL 350 MG/ML SOLN
INTRAVENOUS | Status: DC | PRN
  Administered 2019-05-21: 100 mL via INTRACARDIAC

## 2019-05-21 MED ORDER — CLOPIDOGREL BISULFATE 300 MG PO TABS
ORAL_TABLET | ORAL | Status: DC | PRN
  Administered 2019-05-21: 600 mg via ORAL

## 2019-05-21 MED ORDER — SODIUM CHLORIDE 0.9 % IV SOLN: INTRAVENOUS | Status: AC

## 2019-05-21 MED ORDER — CLOPIDOGREL BISULFATE 75 MG PO TABS: 75.0000 mg | ORAL_TABLET | Freq: Every day | ORAL | Status: DC

## 2019-05-21 MED ORDER — FAMOTIDINE IN NACL 20-0.9 MG/50ML-% IV SOLN
INTRAVENOUS | Status: AC | PRN
  Administered 2019-05-21: 20 mg via INTRAVENOUS

## 2019-05-21 MED ORDER — PANTOPRAZOLE SODIUM 40 MG PO TBEC: 40.0000 mg | DELAYED_RELEASE_TABLET | Freq: Every day | ORAL | 1 refills | Status: DC

## 2019-05-21 MED ORDER — VERAPAMIL HCL 2.5 MG/ML IV SOLN
INTRAVENOUS | Status: AC
  Filled 2019-05-21: qty 2

## 2019-05-21 MED ORDER — HEPARIN SODIUM (PORCINE) 1000 UNIT/ML IJ SOLN
INTRAMUSCULAR | Status: DC | PRN
  Administered 2019-05-21: 6000 [IU] via INTRAVENOUS
  Administered 2019-05-21: 4500 [IU] via INTRAVENOUS
  Administered 2019-05-21: 2000 [IU] via INTRAVENOUS

## 2019-05-21 MED ORDER — METOPROLOL SUCCINATE 12.5 MG HALF TABLET: 12.5000 mg | ORAL_TABLET | Freq: Every day | ORAL | Status: DC

## 2019-05-21 MED ORDER — LIDOCAINE HCL (PF) 1 % IJ SOLN
INTRAMUSCULAR | Status: DC | PRN
  Administered 2019-05-21 (×2): 2 mL via INTRADERMAL

## 2019-05-21 MED ORDER — SODIUM CHLORIDE 0.9 % WEIGHT BASED INFUSION: 1.0000 mL/kg/h | INTRAVENOUS | Status: DC

## 2019-05-21 MED ORDER — MESALAMINE 1.2 G PO TBEC: 1.2000 g | DELAYED_RELEASE_TABLET | Freq: Every day | ORAL | Status: DC

## 2019-05-21 MED ORDER — HEPARIN (PORCINE) IN NACL 1000-0.9 UT/500ML-% IV SOLN
INTRAVENOUS | Status: AC
  Filled 2019-05-21: qty 1000

## 2019-05-21 MED ORDER — ASPIRIN 81 MG PO CHEW: 81.0000 mg | CHEWABLE_TABLET | ORAL | Status: DC

## 2019-05-21 MED ORDER — DULAGLUTIDE 1.5 MG/0.5ML ~~LOC~~ SOAJ: 1.5000 mg | SUBCUTANEOUS | Status: DC

## 2019-05-21 MED ORDER — HYDROXYZINE HCL 25 MG PO TABS: 25.0000 mg | ORAL_TABLET | ORAL | Status: DC | PRN

## 2019-05-21 MED ORDER — RAMIPRIL 2.5 MG PO CAPS: 2.5000 mg | ORAL_CAPSULE | Freq: Every day | ORAL | Status: DC

## 2019-05-21 MED ORDER — VERAPAMIL HCL 2.5 MG/ML IV SOLN
INTRAVENOUS | Status: DC | PRN
  Administered 2019-05-21: 10 mL via INTRA_ARTERIAL

## 2019-05-21 MED ORDER — NITROGLYCERIN 1 MG/10 ML FOR IR/CATH LAB
INTRA_ARTERIAL | Status: AC
  Filled 2019-05-21: qty 10

## 2019-05-21 MED ORDER — FAMOTIDINE IN NACL 20-0.9 MG/50ML-% IV SOLN
INTRAVENOUS | Status: AC
  Filled 2019-05-21: qty 50

## 2019-05-21 MED ORDER — ONDANSETRON HCL 4 MG/2ML IJ SOLN: 4.0000 mg | Freq: Four times a day (QID) | INTRAMUSCULAR | Status: DC | PRN

## 2019-05-21 MED ORDER — ACETAMINOPHEN 325 MG PO TABS: 650.0000 mg | ORAL_TABLET | ORAL | Status: DC | PRN

## 2019-05-21 MED ORDER — HYDRALAZINE HCL 20 MG/ML IJ SOLN: 10.0000 mg | INTRAMUSCULAR | Status: DC | PRN

## 2019-05-21 MED ORDER — SODIUM CHLORIDE 0.9 % IV SOLN
INTRAVENOUS | Status: AC | PRN
  Administered 2019-05-21: 250 mL via INTRAVENOUS

## 2019-05-21 MED ORDER — LABETALOL HCL 5 MG/ML IV SOLN: 10.0000 mg | INTRAVENOUS | Status: DC | PRN

## 2019-05-21 MED ORDER — SODIUM CHLORIDE 0.9 % WEIGHT BASED INFUSION
3.0000 mL/kg/h | INTRAVENOUS | Status: DC
  Administered 2019-05-21: 07:00:00 3 mL/kg/h via INTRAVENOUS

## 2019-05-21 MED ORDER — MIDAZOLAM HCL 2 MG/2ML IJ SOLN
INTRAMUSCULAR | Status: DC | PRN
  Administered 2019-05-21: 2 mg via INTRAVENOUS
  Administered 2019-05-21 (×4): 1 mg via INTRAVENOUS

## 2019-05-21 MED ORDER — FENTANYL CITRATE (PF) 100 MCG/2ML IJ SOLN
INTRAMUSCULAR | Status: DC | PRN
  Administered 2019-05-21 (×5): 25 ug via INTRAVENOUS

## 2019-05-21 MED ORDER — NITROGLYCERIN 1 MG/10 ML FOR IR/CATH LAB
INTRA_ARTERIAL | Status: DC | PRN
  Administered 2019-05-21: 100 ug via INTRACORONARY
  Administered 2019-05-21: 200 ug via INTRACORONARY
  Administered 2019-05-21: 200 ug via INTRA_ARTERIAL

## 2019-05-21 MED ORDER — CLOPIDOGREL BISULFATE 300 MG PO TABS
ORAL_TABLET | ORAL | Status: AC
  Filled 2019-05-21: qty 2

## 2019-05-21 MED ORDER — ATORVASTATIN CALCIUM 40 MG PO TABS: 40.0000 mg | ORAL_TABLET | Freq: Every day | ORAL | 0 refills | Status: DC

## 2019-05-21 MED ORDER — LIDOCAINE HCL (PF) 1 % IJ SOLN
INTRAMUSCULAR | Status: AC
  Filled 2019-05-21: qty 30

## 2019-05-21 MED ORDER — EPINEPHRINE 0.3 MG/0.3ML IJ SOAJ: 0.3000 mg | INTRAMUSCULAR | Status: DC | PRN

## 2019-05-21 MED ORDER — NITROGLYCERIN 0.4 MG SL SUBL: 0.4000 mg | SUBLINGUAL_TABLET | SUBLINGUAL | 2 refills | Status: DC | PRN

## 2019-05-21 MED ORDER — HEPARIN (PORCINE) IN NACL 1000-0.9 UT/500ML-% IV SOLN
INTRAVENOUS | Status: DC | PRN
  Administered 2019-05-21 (×2): 500 mL

## 2019-05-21 MED ORDER — CLOPIDOGREL BISULFATE 75 MG PO TABS: 75.0000 mg | ORAL_TABLET | Freq: Every day | ORAL | 6 refills | Status: DC

## 2019-05-21 MED FILL — CLOPIDOGREL 75 MG TABLET: 75 | 30 days supply | Qty: 30 | Fill #0

## 2019-05-21 SURGICAL SUPPLY — 23 items
BALLN SAPPHIRE 2.5X15 (BALLOONS) ×2
BALLN SAPPHIRE ~~LOC~~ 3.0X15 (BALLOONS) ×1 IMPLANT
BALLN WOLVERINE 2.50X10 (BALLOONS) ×2
BALLOON SAPPHIRE 2.5X15 (BALLOONS) IMPLANT
BALLOON WOLVERINE 2.50X10 (BALLOONS) IMPLANT
CATH 5FR JL3.5 JR4 ANG PIG MP (CATHETERS) ×1 IMPLANT
CATH BALLN WEDGE 5F 110CM (CATHETERS) ×1 IMPLANT
CATH LAUNCHER 6FR JR4 (CATHETERS) ×1 IMPLANT
DEVICE RAD COMP TR BAND LRG (VASCULAR PRODUCTS) ×1 IMPLANT
GLIDESHEATH SLEND SS 6F .021 (SHEATH) ×1 IMPLANT
GUIDEWIRE INQWIRE 1.5J.035X260 (WIRE) IMPLANT
INQWIRE 1.5J .035X260CM (WIRE) ×2
KIT ENCORE 26 ADVANTAGE (KITS) ×1 IMPLANT
KIT HEART LEFT (KITS) ×2 IMPLANT
KIT HEMO VALVE WATCHDOG (MISCELLANEOUS) ×1 IMPLANT
PACK CARDIAC CATHETERIZATION (CUSTOM PROCEDURE TRAY) ×2 IMPLANT
SHEATH GLIDE SLENDER 4/5FR (SHEATH) ×1 IMPLANT
SHEATH PROBE COVER 6X72 (BAG) ×1 IMPLANT
STENT SYNERGY DES 2.75X20 (Permanent Stent) ×1 IMPLANT
TRANSDUCER W/STOPCOCK (MISCELLANEOUS) ×2 IMPLANT
TUBING CIL FLEX 10 FLL-RA (TUBING) ×2 IMPLANT
WIRE ASAHI PROWATER 180CM (WIRE) ×1 IMPLANT
WIRE HI TORQ BMW 190CM (WIRE) ×1 IMPLANT

## 2019-05-21 NOTE — Discharge Summary (Addendum)
Discharge Summary/SAME DAY PCI    Patient ID: Courtney Olson,  MRN: [id number], DOB/AGE: [birth year] 53 y.o.  Admit date: 05/21/2019 Discharge date: 05/21/2019  Primary Care Provider: Judge Stall (Inactive) Primary Cardiologist: Dr. Radford Pax   Discharge Diagnoses    Principal Problem:   CAD in native artery Active Problems:   Hyperlipidemia   PAF (paroxysmal atrial fibrillation) (HCC)   Exertional dyspnea   Allergies Allergies  Allergen Reactions   Orange Fruit [Citrus] Anaphylaxis   Bactrim [Sulfamethoxazole-Trimethoprim] Rash   Tape Rash    Diagnostic Studies/Procedures    Cath: 05/21/19   Prox RCA lesion is 25% stenosed.  Mid RCA lesion is 80% stenosed.  Dist RCA lesion is 25% stenosed.  The left ventricular systolic function is normal.  LV end diastolic pressure is normal.  The left ventricular ejection fraction is 55-65% by visual estimate.  There is no aortic valve stenosis.  1st Diag and 2nd Diag, 60% stenosed. Very small vesssels.  Ao sat 99%, PA sat 76%, mean PA pressure 14 mm Hg; mean PCWP 4 mm Hg.  A drug-eluting stent was successfully placed using a STENT SYNERGY DES 2.75X20.  Post intervention, there is a 0% residual stenosis.   Generally calcified RCA with moderate disease proximally and distally.  If PCI was needed, would have to consider more supportive guide and guide extension to deliver stents more distally.   Continue aggressive secondary prevention.    Plan for same day PCI.   Diagnostic Dominance: Right  Intervention    _____________   History of Present Illness     Courtney Olson is a 53 y.o. female with history of HTN, DM, paroxysmal atrial fibrillation on Xarelto, vasovagal syncope, pancolitis, calcifications of the abdominal aorta by CT, CMV hepatitis, hyperlipidemia, OSA, RA, GERD, obesity, bradycardia, pancreatitis, suspected mild renal insufficiency (?CKD stage II based on labs) who presented to the office to  discuss cath at the request of Dr. Radford Pax due to concern for CAD.  She has a remote history of chest pain with negative workup with stress test and echo. Nuc 07/2012 was normal. 2D echo 03/2014 showed normal EF, grade 1 DD. ETT 04/2014 was normal. She has had pancreatitis and was diagnosed with a kidney mass and underwent a large resection of a benign mass complicated by multiple infections. In July 2015 she had an episode of syncope following severe abdominal pain, felt vasovagal in nature. She actually reported over 40 episodes of syncope in the last 10 years. These initially began in the setting of anaphylactic reactions but in more recent years have followed acute abdominal pain related to her ulcerative colitis. It has been felt that prevention of the GI pain was the key to avoiding further syncope. Cardiac monitor 05/2016 showed NSR (sinus brady-sinus tach 51-127bpm), average HR 73bpm, isoalted atrial couplet.   She recently saw Dr. Radford Pax on 04/28/19 for evaluation of DOE, exertional fatigue, chest tightness and palpitations. She thought that she had COVID back in January. She was on a cruise ship and the day she got off she became ill with fever, chills, body aches, cough and SOB which lasted over a week. Multiple people from her group became ill and none of them tested positive for the flu or strep. Prior to getting sick, she was walking at least 10-15K steps and after being sick she has become so fatigued that she can only walk 57 steps without getting DOE and fatigue. She has also had intermittent cough. She was noticing high  blood pressure over the summer but in the context of anxiety over waiting for an in-person appointment. She had also noticed some decreased BPs in the 0000000 systolic after exercise. 2D echo 05/06/19 showed EF 60-65%, impaired diastolic relaxation, no acute abnormalities. BNP 05/07/19 at was mildly elevated at 250 so HCTZ was stopped and she was started on Lasix. Coronary CTA  05/13/19 was abnormal with concern for CTO of the RCA and moderate prox-mid LAD disease. Dr. Radford Pax recommended she be seen back to discuss cardiac catheterization. Otherwise f/u Epic labs 05/15/19 showed BNP 382, K 4.4, Cr 1.14 (baseline Cr appearing 0.9-1.2), glucose 238. [facility] labs 05/04/19 showed Tchol 158, HDL 62, LDL 69, trig 135, A1C 8.1, Hgb 13.4, ALT 24.  She was seen back in the office on 05/19/19 for follow up. She has felt as though she's gotten conflicting information speaking with numerous people in our office about her results the last few weeks (fluid around heart, fluid in lungs, etc). She googled the BNP level and was extremely concerned seeing statistics about mortality rate and CHF. The exertional dyspnea was no worse than when it started back in January (no unstable acute symptoms), but still a definite change from her baseline. She also noticed that even something as simple as taking a shower and getting cleaned up results in significant fatigue. This was frustrating to her as she was quite active before. No anginal type chest pain or orthopnea. She did notice a period of time where she had pain between her shoulder blades for 2 weeks but felt it was related to mattress. Option of cath was discussed with the patient and she was agreeable to proceed.   Hospital Course     Underwent cardiac cath noted above with mRCA lesion of 80% treated with PCI/DES x1. Did have residual disease in 1st/2nd diag of 60% but noted to be small vessels. pRCA and dRCA with 25% lesion. Plan for medical therapy for residual disease. Plan for Plavix as well as Xarelto for at least 6 months, no ASA. Seen by cardiac rehab while in short stay. No complications post cath. Radial site stable. Long discussion with the patient regarding her cath results. She is concerned regarding her diagnosis of CHF. I reviewed her echo and cath numbers. LVEDP was 7. She was feeling much better post cath. I offered that she can hold her  lasix until seen back in follow up in the office. If shortness of breath returns then may need to restart. Instructions/precautions regarding cath site care given prior to discharge. She will monitor her blood pressures and bring to follow up. She has been taking atorvastatin 20mg  daily, feels she may have had myalgias related to higher dose in the past. She is willing to re trial atorvastatin at 40mg .   [name] was seen by Dr. Irish Lack and determined stable for discharge home. Follow up in the office has been arranged. Medications are listed below.   _____________  Discharge Vitals Blood pressure (!) 101/50, pulse (!) 59, temperature 98.6 F (37 C), temperature source Oral, resp. rate 16, height 5\' 5"  (1.651 m), weight 92.5 kg, last menstrual period 07/16/2000, SpO2 100 %.  Filed Weights   05/21/19 0542  Weight: 92.5 kg    Labs & Radiologic Studies    CBC Recent Labs    05/19/19 1229  WBC 7.0  HGB 13.8  HCT 42.1  MCV 89  PLT 123456   Basic Metabolic Panel Recent Labs    05/19/19 1229  NA  140  K 4.8  CL 97  CO2 26  GLUCOSE 297*  BUN 23  CREATININE 1.20*  CALCIUM 9.8   Liver Function Tests No results for input(s): AST, ALT, ALKPHOS, BILITOT, PROT, ALBUMIN in the last 72 hours. No results for input(s): LIPASE, AMYLASE in the last 72 hours. Cardiac Enzymes No results for input(s): CKTOTAL, CKMB, CKMBINDEX, TROPONINI in the last 72 hours. BNP Invalid input(s): POCBNP D-Dimer No results for input(s): DDIMER in the last 72 hours. Hemoglobin A1C No results for input(s): HGBA1C in the last 72 hours. Fasting Lipid Panel No results for input(s): CHOL, HDL, LDLCALC, TRIG, CHOLHDL, LDLDIRECT in the last 72 hours. Thyroid Function Tests No results for input(s): TSH, T4TOTAL, T3FREE, THYROIDAB in the last 72 hours.  Invalid input(s): FREET3 _____________  Ct Abdomen Pelvis W Contrast  Result Date: 05/13/2019 CLINICAL DATA:  Femoral hernia on the right EXAM: CT ABDOMEN  AND PELVIS WITH CONTRAST TECHNIQUE: Multidetector CT imaging of the abdomen and pelvis was performed using the standard protocol following bolus administration of intravenous contrast. CONTRAST:  34mL OMNIPAQUE IOHEXOL 350 MG/ML SOLN COMPARISON:  05/04/2018 FINDINGS: Lower chest: Lung bases are clear. No effusions. Heart is normal size. Coronary artery calcifications seen in the right coronary artery. Hepatobiliary: No focal liver abnormality is seen. Status post cholecystectomy. No biliary dilatation. Pancreas: No focal abnormality or ductal dilatation. Spleen: No focal abnormality.  Normal size. Adrenals/Urinary Tract: No adrenal abnormality. No focal renal abnormality. No stones or hydronephrosis. Urinary bladder is unremarkable. Stomach/Bowel: Normal appendix. Stomach, large and small bowel grossly unremarkable. Vascular/Lymphatic: Aortic atherosclerosis. No enlarged abdominal or pelvic lymph nodes. Reproductive: Prior hysterectomy.  No adnexal masses. Other: No free fluid or free air. No visible inguinal hernia. Small umbilical and infraumbilical ventral hernias containing fat. These are stable. Musculoskeletal: No acute bony abnormality. IMPRESSION: No visible inguinal/femoral hernia. Small umbilical and infraumbilical ventral hernias containing fat, stable. No acute findings in the abdomen or pelvis. Electronically Signed   By: Rolm Baptise M.D.   On: 05/13/2019 16:58   Ct Coronary Morph W/cta Cor W/score W/ca W/cm &/or Wo/cm  Addendum Date: 05/13/2019   ADDENDUM REPORT: 05/13/2019 22:57 CLINICAL DATA:  Chest pain EXAM: Cardiac/Coronary CTA TECHNIQUE: The patient was scanned on a [employer]. A 100 kV prospective scan was triggered in the descending thoracic aorta at 111 HU's. Axial non-contrast 3 mm slices were carried out through the heart. The data set was analyzed on a dedicated work station and scored using the [place]. Gantry rotation speed was 250 msecs and collimation was .6 mm.  No beta blockade and 0.8 mg of sl NTG was given. The 3D data set was reconstructed in 5% intervals of the 35-75 % of the R-R cycle. Diastolic phases were analyzed on a dedicated work station using MPR, MIP and VRT modes. The patient received 80 cc of contrast. FINDINGS: Image quality: Excellent. Noise artifact is: Limited. Coronary Arteries:  Normal coronary origin.  Right dominance. Left main: The left main is a large caliber vessel with a normal take off from the left coronary cusp that bifurcates to form a left anterior descending artery and a left circumflex artery. There is minimal non-calcified plaque (<25%). Left anterior descending artery: The ostial/proximal LAD contains heavily calcified that is circumferential concerning for moderate stenosis (50-69%). The mid LAD contains heavily calcified plaque that is circumferential concerning for a moderate stenosis (50-69%). The distal LAD is diffusely diseased with minimal non-calcified plaque (<25%). There are 3 small diagonal branches  that are small and diffusely diseased with at least moderate (50-69%) mixed density plaque. Left circumflex artery: The LCX is non-dominant. The proximal LCX contains mild calcified plaque (25-49%). OM1 is diffusely diseased with mild mixed density plaque (25-49%). The LCX terminates as a small OM2 branch. Right coronary artery: The RCA is dominant. The proximal RCA is heavily calcified with mild plaque (25-49%). The mRCA is heavily calcified and there is circumferential disease. The mRCA contains a focal area void of contrast concerning for a chronic total occlusion (100%). The RCA distal to this fills with contrast and there appear to be L to R collaterals from the distal LAD to the rPDA. The distal RCA is diffusely diseased with mild calcified plaque (25-49%). Right Atrium: Right atrial size is within normal limits. Right Ventricle: The right ventricular cavity is within normal limits. Left Atrium: Left atrial size is normal in  size with no left atrial appendage filling defect. Left Ventricle: The ventricular cavity size is within normal limits. There are no stigmata of prior infarction. There is no abnormal filling defect. Pulmonary arteries: Normal in size without proximal filling defect. Pulmonary veins: Normal pulmonary venous drainage. Pericardium: Normal thickness with no significant effusion or calcium present. Cardiac valves: The aortic valve is trileaflet with trace calcification. The mitral valve is normal structure without significant calcification. Aorta: Normal caliber with no significant disease. Extra-cardiac findings: See attached radiology report for non-cardiac structures. IMPRESSION: 1. Coronary calcium score of 2924. This was 99th percentile for age and sex matched control. 2. Normal coronary origin with right dominance. 3. Concerns for a chronic total occlusion (100%) of the mRCA with L to R collaterals as described above. 4. Concerns for moderate proximal and mid LAD (50-69%) stenoses with small vessel disease in the diagonal branches. 5. Mild calcified plaque (25-49%) in the LCX/OM branches. RECOMMENDATIONS: 1. Cardiac catheterization is recommended to better define anatomy. Recommend antianginal therapy and aggressive risk factor modificaiton. 2. Will send CT-FFR to better evaluate mLAD. Eleonore Chiquito, MD Electronically Signed   By: Eleonore Chiquito   On: 05/13/2019 22:57   Result Date: 05/13/2019 EXAM: OVER-READ INTERPRETATION  CT CHEST The following report is an over-read performed by radiologist Dr. [name] of [facility], [place] on 05/13/2019. This over-read does not include interpretation of cardiac or coronary anatomy or pathology. The coronary calcium score/coronary CTA interpretation by the cardiologist is attached. COMPARISON:  None. FINDINGS: Aortic atherosclerosis. Within the visualized portions of the thorax there are no suspicious appearing pulmonary nodules or masses, there is no acute  consolidative airspace disease, no pleural effusions, no pneumothorax and no lymphadenopathy. Visualized portions of the upper abdomen are unremarkable. There are no aggressive appearing lytic or blastic lesions noted in the visualized portions of the skeleton. IMPRESSION: 1.  Aortic Atherosclerosis (ICD10-I70.0). Electronically Signed: By: [name] M.D. On: 05/13/2019 13:55   Ct Coronary Fractional Flow Reserve Data Prep  Result Date: 05/13/2019 EXAM: CT FFR ANALYSIS CLINICAL DATA:  Chest pain FINDINGS: FFRct analysis was performed on the original cardiac CT angiogram dataset. Diagrammatic representation of the FFRct analysis is provided in a separate PDF document in PACS. This dictation was created using the PDF document and an interactive 3D model of the results. 3D model is not available in the EMR/PACS. Normal FFR range is >0.80. 1. Left Main: 0.98; No significant stenosis. 2. LAD: 0.87; no significant stenosis. 3. LCX: 0.86; no significant stenosis. 4. RCA: 0.70; significant stenosis. IMPRESSION: 1. CT FFR analysis didn't show any significant stenosis  in the LAD/LCX 2.  Concerns remain for CTO of mRCA as reported in CCTA report. Eleonore Chiquito, MD Electronically Signed   By: Eleonore Chiquito   On: 05/13/2019 23:02   Disposition   Pt is being discharged home today in good condition.  Follow-up Plans & Appointments    Follow-up Information    Sueanne Margarita, MD Follow up on 06/04/2019.   Specialty: Cardiology Why: at 11:40am for your follow up appt.  Contact information: [id number] [address] [place] [id number] [phone]          Discharge Instructions    Amb Referral to Cardiac Rehabilitation   Complete by: As directed    Diagnosis: Coronary Stents   After initial evaluation and assessments completed: Virtual Based Care may be provided alone or in conjunction with Phase 2 Cardiac Rehab based on patient barriers.: Yes     Discharge Medications     Medication  List    STOP taking these medications   famotidine 10 MG tablet Commonly known as: PEPCID   furosemide 20 MG tablet Commonly known as: LASIX     TAKE these medications   ALPRAZolam 0.25 MG tablet Commonly known as: XANAX Take 1 tablet by mouth 2 (two) times daily as needed.   atorvastatin 40 MG tablet Commonly known as: LIPITOR Take 1 tablet (40 mg total) by mouth daily. What changed:   medication strength  how much to take   Basaglar KwikPen 100 UNIT/ML Sopn Inject 14 Units into the skin daily.   clopidogrel 75 MG tablet Commonly known as: PLAVIX Take 1 tablet (75 mg total) by mouth daily with breakfast. Start taking on: May 22, 2019   EPINEPHrine 0.3 mg/0.3 mL Soaj injection Commonly known as: EPI-PEN Inject 0.3 mLs into the skin as needed.   glipiZIDE 10 MG 24 hr tablet Commonly known as: GLUCOTROL XL Take 10 mg by mouth daily.   hydrOXYzine 25 MG tablet Commonly known as: ATARAX/VISTARIL Take 25 mg by mouth as needed.   Lialda 1.2 g EC tablet Generic drug: mesalamine Take 1.2 g by mouth daily.   metoprolol succinate 25 MG 24 hr tablet Commonly known as: TOPROL-XL Take 0.5 tablets (12.5 mg total) by mouth daily.   nitroGLYCERIN 0.4 MG SL tablet Commonly known as: Nitrostat Place 1 tablet (0.4 mg total) under the tongue every 5 (five) minutes as needed.   pantoprazole 40 MG tablet Commonly known as: Protonix Take 1 tablet (40 mg total) by mouth daily.   ramipril 2.5 MG capsule Commonly known as: ALTACE Take 1 capsule (2.5 mg total) by mouth daily.   rivaroxaban 20 MG Tabs tablet Commonly known as: Xarelto Take 1 tablet (20 mg total) by mouth daily. Please schedule yearly appt for anymore refills, thanks! [phone] 1st Attempt   Trulicity 1.5 0000000 Sopn Generic drug: Dulaglutide Inject 1.5 mg into the skin every 7 (seven) days.        No                               Did the patient have a percutaneous coronary intervention (stent  / angioplasty)?:  Yes.     Cath/PCI Registry Performance & Quality Measures: 1. Aspirin prescribed? - No - on Xarelto. Not planned for triple therapy. 2. ADP Receptor Inhibitor (Plavix/Clopidogrel, Brilinta/Ticagrelor or Effient/Prasugrel) prescribed (includes medically managed patients)? - Yes 3. High Intensity Statin (Lipitor 40-80mg  or Crestor 20-40mg ) prescribed? - Yes  4. For EF <40%, was ACEI/ARB prescribed? - Not Applicable (EF >/= [id number]) 5. For EF <40%, Aldosterone Antagonist (Spironolactone or Eplerenone) prescribed? - Not Applicable (EF >/= [id number]) 6. Cardiac Rehab Phase II ordered (Included Medically managed Patients)? - Yes   Outstanding Labs/Studies   FLP/LFTs in 6 weeks.   Duration of Discharge Encounter   Greater than 30 minutes including physician time.  Signed, Reino Bellis NP-C 05/21/2019, 2:30 PM  I have examined the patient and reviewed assessment and plan and discussed with patient.  Agree with above as stated.  She tolerated the procedure very well.  Explained rationale of antiplatelet therapy to her.  Spoke with her husband.  Wrist stable.  OK for discharge.  COntinue aggressive secondary prevention.  Larae Grooms

## 2019-05-21 NOTE — Progress Notes (Signed)
Cath lab to get Access for (R) heart cath

## 2019-05-21 NOTE — Progress Notes (Addendum)
Discharge instructions reviewed with pt voices understanding. Attempted to call pt husband states he will call me right back.

## 2019-05-21 NOTE — Progress Notes (Signed)
Discharge instructions reviewed with pt husband (via telephone) voices understanding.  

## 2019-05-21 NOTE — Discharge Instructions (Signed)
Radial Site Care ° °This sheet gives you information about how to care for yourself after your procedure. Your health care provider may also give you more specific instructions. If you have problems or questions, contact your health care provider. °What can I expect after the procedure? °After the procedure, it is common to have: °· Bruising and tenderness at the catheter insertion area. °Follow these instructions at home: °Medicines °· Take over-the-counter and prescription medicines only as told by your health care provider. °Insertion site care °· Follow instructions from your health care provider about how to take care of your insertion site. Make sure you: °? Wash your hands with soap and water before you change your bandage (dressing). If soap and water are not available, use hand sanitizer. °? Change your dressing as told by your health care provider. °? Leave stitches (sutures), skin glue, or adhesive strips in place. These skin closures may need to stay in place for 2 weeks or longer. If adhesive strip edges start to loosen and curl up, you may trim the loose edges. Do not remove adhesive strips completely unless your health care provider tells you to do that. °· Check your insertion site every day for signs of infection. Check for: °? Redness, swelling, or pain. °? Fluid or blood. °? Pus or a bad smell. °? Warmth. °· Do not take baths, swim, or use a hot tub until your health care provider approves. °· You may shower 24-48 hours after the procedure, or as directed by your health care provider. °? Remove the dressing and gently wash the site with plain soap and water. °? Pat the area dry with a clean towel. °? Do not rub the site. That could cause bleeding. °· Do not apply powder or lotion to the site. °Activity ° °· For 24 hours after the procedure, or as directed by your health care provider: °? Do not flex or bend the affected arm. °? Do not push or pull heavy objects with the affected arm. °? Do not  drive yourself home from the hospital or clinic. You may drive 24 hours after the procedure unless your health care provider tells you not to. °? Do not operate machinery or power tools. °· Do not lift anything that is heavier than 10 lb (4.5 kg), or the limit that you are told, until your health care provider says that it is safe. °· Ask your health care provider when it is okay to: °? Return to work or school. °? Resume usual physical activities or sports. °? Resume sexual activity. °General instructions °· If the catheter site starts to bleed, raise your arm and put firm pressure on the site. If the bleeding does not stop, get help right away. This is a medical emergency. °· If you went home on the same day as your procedure, a responsible adult should be with you for the first 24 hours after you arrive home. °· Keep all follow-up visits as told by your health care provider. This is important. °Contact a health care provider if: °· You have a fever. °· You have redness, swelling, or yellow drainage around your insertion site. °Get help right away if: °· You have unusual pain at the radial site. °· The catheter insertion area swells very fast. °· The insertion area is bleeding, and the bleeding does not stop when you hold steady pressure on the area. °· Your arm or hand becomes pale, cool, tingly, or numb. °These symptoms may represent a serious problem   that is an emergency. Do not wait to see if the symptoms will go away. Get medical help right away. Call your local emergency services (911 in the [place] Do not drive yourself to the hospital. °Summary °· After the procedure, it is common to have bruising and tenderness at the site. °· Follow instructions from your health care provider about how to take care of your radial site wound. Check the wound every day for signs of infection. °· Do not lift anything that is heavier than 10 lb (4.5 kg), or the limit that you are told, until your health care provider says  that it is safe. °This information is not intended to replace advice given to you by your health care provider. Make sure you discuss any questions you have with your health care provider. °Document Released: 08/04/2010 Document Revised: 08/07/2017 Document Reviewed: 08/07/2017 °Elsevier Patient Education © 2020 [employer] ° °

## 2019-05-21 NOTE — Interval H&P Note (Signed)
Cath Lab Visit (complete for each Cath Lab visit)  Clinical Evaluation Leading to the Procedure:   ACS: No.  Non-ACS:    Anginal Classification: CCS III  Anti-ischemic medical therapy: Minimal Therapy (1 class of medications)  Non-Invasive Test Results: Coronary CT scan done  Prior CABG: No previous CABG       History and Physical Interval Note:  05/21/2019 7:35 AM  Courtney Olson  has presented today for surgery, with the diagnosis of abnormal ct.  The various methods of treatment have been discussed with the patient and family. After consideration of risks, benefits and other options for treatment, the patient has consented to  Procedure(s): RIGHT/LEFT HEART CATH AND CORONARY ANGIOGRAPHY (N/A) as a surgical intervention.  The patient's history has been reviewed, patient examined, no change in status, stable for surgery.  I have reviewed the patient's chart and labs.  Questions were answered to the patient's satisfaction.     Larae Grooms

## 2019-05-21 NOTE — Progress Notes (Signed)
[id number] Education competed with pt who voiced understanding. Stressed importance of plavix with stent. Reviewed NTG use, gave diabetic and heart healthy food choices, walking for ex, and CRP 2. Pt likes to walk for ex. Referred to [place] CRP 2. Pt is interested in participating in Virtual Cardiac and Pulmonary Rehab. Pt advised that Virtual Cardiac and Pulmonary Rehab is provided at no cost to the patient.  Checklist:  1. Pt has smart device  ie smartphone and/or ipad for downloading an app  Yes 2. Reliable internet/wifi service    Yes 3. Understands how to use their smartphone and navigate within an app.  Yes   Pt verbalized understanding and is in agreement.

## 2019-05-22 ENCOUNTER — Encounter (HOSPITAL_COMMUNITY): Payer: Self-pay | Admitting: Interventional Cardiology

## 2019-06-02 ENCOUNTER — Telehealth: Payer: Self-pay | Admitting: Cardiology

## 2019-06-02 NOTE — Telephone Encounter (Signed)
°  Pt c/o medication issue:  1. Name of Medication: hydrOXYzine (ATARAX/VISTARIL) 25 MG tablet / Furosemide (Lasix) 20MG   2. How are you currently taking this medication (dosage and times per day)? N/A  3. Are you having a reaction (difficulty breathing--STAT)? Weight gain  4. What is your medication issue? Patient states she had a Heart Cath on 05/21/19. She was told to take hydrOXYzine (ATARAX/VISTARIL) 25 MG tablet before the procedure and Lasix until the procedure. She has noticed since being off a diuretic she has gained about 4 pounds since the procedure. She states she is more bloated in her stomach. Denies any other symptoms. She's wanting to know if she can take a diuretic today or if it will skew the results for her BMT. Has a follow up appt with Dr. Radford Pax on 06/04/19

## 2019-06-02 NOTE — Telephone Encounter (Signed)
LMTCB

## 2019-06-03 ENCOUNTER — Telehealth: Payer: 59 | Admitting: Physician Assistant

## 2019-06-03 NOTE — Progress Notes (Signed)
Cardiology Office Note:    Date:  06/04/2019   ID:  [name], DOB [birth year], MRN [id number]  PCP:  Judge Stall, FNP (Inactive)  Cardiologist:  Fransico Him, MD    Referring MD: No ref. provider found   Chief Complaint  Patient presents with  . Coronary Artery Disease  . Atrial Fibrillation  . Hypertension  . Hyperlipidemia    History of Present Illness:     Courtney Olson is a 53 y.o. female with history of HTN, DM, paroxysmal atrial fibrillation on Xarelto, vasovagal syncope, hyperlipidemia, OSA, obesity, bradycardia, CAD.   She recently saw me on 04/28/19 for evaluation of DOE, exertional fatigue, chest tightness and palpitations. She thought that she had COVID back in January. She was on a cruise ship and the day she got off she became ill with fever, chills, body aches, cough and SOB which lasted over a week. Multiple people from her group became ill and none of them tested positive for the flu or strep. Prior to getting sick, she was walking at least 10-15K steps and after being sick she has become so fatigued that she can only walk 57 steps without getting DOE and fatigue. She has also had intermittent cough.  2D echo 05/06/19 showed EF 60-65%, impaired diastolic relaxation, no acute abnormalities. BNP 05/07/19 at was mildly elevated at 250 so HCTZ was stopped and she was started on Lasix. Coronary CTA 05/13/19 was abnormal as outlined below with concern for CTO of the RCA and moderate prox-mid LAD disease. She underwent cardiac cath 05/21/2019 showing 805 mRCA, 60% D1 and D2 that were very small and patent LAD and LCx.  She underwent DES to the [facility] with residual 25% stenosis in the dRCA.  She is now on DAPT with ASA and Plavix.  She is here today for followup and is doing well.  She denies any chest pain or pressure, SOB, DOE, PND, orthopnea, LE edema, dizziness, palpitations or syncope. She is compliant with her meds and is tolerating meds with no SE.    Past Medical History:   Diagnosis Date  . Aortic calcification (HCC)   . Atrophic vaginitis 01/2006  . Bradycardia 05/2014  . CAD (coronary artery disease)    a. noted on cor CT.  Marland Kitchen CAD in native artery   . Chronic pancolonic ulcerative colitis ([place]) 2015   unknown if chronic.  . CKD (chronic kidney disease), stage II   . Cytomegaloviral hepatitis ([place]) 2005  . Diabetes mellitus without complication ([place]) 99991111   on Ace inhibitor to protect kidneys  . Gallstones 01/2013  . GERD (gastroesophageal reflux disease)   . Hyperlipidemia   . Hypertension   . Kidney tumor 03/2013   Right  . Obesity   . OSA (obstructive sleep apnea)   . PAF (paroxysmal atrial fibrillation) ([place])   . Pancreatitis 2014  . Postmenopausal   . RA (rheumatoid arthritis) ([place])   . Syncope and collapse   . Ulcerative colitis ([place]) 2015    Past Surgical History:  Procedure Laterality Date  . CESAREAN SECTION    . CHOLECYSTECTOMY  01/2013  . COLONOSCOPY  2015   Colitis  . CORONARY STENT INTERVENTION N/A 05/21/2019   Procedure: CORONARY STENT INTERVENTION;  Surgeon: Jettie Booze, MD;  Location: [facility];  Service: Cardiovascular;  Laterality: N/A;  . DILATION AND CURETTAGE OF UTERUS  2002  . HERNIA REPAIR  01/2013  . Kidney and Bladder Stent and tube placed Right 03/2013  . PARTIAL  NEPHRECTOMY Right 02/2013   Tumor Removal   . Procedure to remove kidney and bladder stent and tube Right 04/2013  . RIGHT/LEFT HEART CATH AND CORONARY ANGIOGRAPHY N/A 05/21/2019   Procedure: RIGHT/LEFT HEART CATH AND CORONARY ANGIOGRAPHY;  Surgeon: Jettie Booze, MD;  Location: [facility];  Service: Cardiovascular;  Laterality: N/A;  . TOTAL ABDOMINAL HYSTERECTOMY W/ BILATERAL SALPINGOOPHORECTOMY  2002   Secondary to AUB    Current Medications: Current Meds  Medication Sig  . ALPRAZolam (XANAX) 0.25 MG tablet Take 1 tablet by mouth 2 (two) times daily as needed.  Marland Kitchen atorvastatin (LIPITOR) 40 MG tablet Take 1 tablet (40 mg  total) by mouth daily.  Marland Kitchen EPINEPHrine 0.3 mg/0.3 mL IJ SOAJ injection Inject 0.3 mLs into the skin as needed.  Marland Kitchen glipiZIDE (GLUCOTROL XL) 10 MG 24 hr tablet Take 10 mg by mouth daily.  . hydrOXYzine (ATARAX/VISTARIL) 25 MG tablet Take 25 mg by mouth as needed.  . Insulin Glargine (BASAGLAR KWIKPEN) 100 UNIT/ML SOPN Inject 14 Units into the skin 2 (two) times daily.   . mesalamine (LIALDA) 1.2 G EC tablet Take 1.2 g by mouth daily.   . metoprolol succinate (TOPROL-XL) 25 MG 24 hr tablet Take 0.5 tablets (12.5 mg total) by mouth daily.  . nitroGLYCERIN (NITROSTAT) 0.4 MG SL tablet Place 1 tablet (0.4 mg total) under the tongue every 5 (five) minutes as needed.  . ramipril (ALTACE) 2.5 MG capsule Take 1 capsule (2.5 mg total) by mouth daily.  . rivaroxaban (XARELTO) 20 MG TABS tablet Take 1 tablet (20 mg total) by mouth daily.  . TRULICITY 1.5 0000000 SOPN Inject 1.5 mg into the skin every 7 (seven) days.   . [DISCONTINUED] clopidogrel (PLAVIX) 75 MG tablet Take 1 tablet (75 mg total) by mouth daily with breakfast.  . [DISCONTINUED] pantoprazole (PROTONIX) 40 MG tablet Take 1 tablet (40 mg total) by mouth daily.  . [DISCONTINUED] rivaroxaban (XARELTO) 20 MG TABS tablet Take 1 tablet (20 mg total) by mouth daily. Please schedule yearly appt for anymore refills, thanks! [phone] 1st Attempt     Allergies:   Orange fruit [citrus], Bactrim [sulfamethoxazole-trimethoprim], and Tape   Social History   Socioeconomic History  . Marital status: Married    Spouse name: Not on file  . Number of children: Not on file  . Years of education: Not on file  . Highest education level: Not on file  Occupational History  . Not on file  Social Needs  . Financial resource strain: Not on file  . Food insecurity    Worry: Not on file    Inability: Not on file  . Transportation needs    Medical: Not on file    Non-medical: Not on file  Tobacco Use  . Smoking status: Never Smoker  . Smokeless tobacco:  Never Used  Substance and Sexual Activity  . Alcohol use: Yes    Comment: 2 a month  . Drug use: No  . Sexual activity: Yes    Partners: Male    Birth control/protection: Surgical    Comment: TAH/BSO  Lifestyle  . Physical activity    Days per week: Not on file    Minutes per session: Not on file  . Stress: Not on file  Relationships  . Social Herbalist on phone: Not on file    Gets together: Not on file    Attends religious service: Not on file    Active member of club or  organization: Not on file    Attends meetings of clubs or organizations: Not on file    Relationship status: Not on file  Other Topics Concern  . Not on file  Social History Narrative  . Not on file     Family History: The patient's family history includes Cancer in her maternal grandmother and paternal aunt; Diabetes in her father, maternal grandmother, mother, and paternal aunt; Fibromyalgia in her mother; Heart disease in her father; Hypertension in her father, mother, and sister. There is no history of Breast cancer.  ROS:   Please see the history of present illness.    ROS  All other systems reviewed and negative.   EKGs/Labs/Other Studies Reviewed:    The following studies were reviewed today: Cardiac cath  EKG:  EKG is not ordered today.   Recent Labs: 05/15/2019: NT-Pro BNP 382 05/19/2019: BUN 23; Creatinine, Ser 1.20; Platelets 201 05/21/2019: Hemoglobin 11.2; Potassium 3.7; Sodium 137   Recent Lipid Panel    Component Value Date/Time   CHOL 232 (H) 05/17/2014 1113   TRIG 172.0 (H) 05/17/2014 1113   HDL 55.50 05/17/2014 1113   CHOLHDL 4 05/17/2014 1113   VLDL 34.4 05/17/2014 1113   LDLCALC 142 (H) 05/17/2014 1113    Physical Exam:    VS:  BP 132/68   Pulse 70   Ht 5\' 5"  (1.651 m)   Wt 212 lb 9.6 oz (96.4 kg)   LMP 07/16/2000 (Approximate)   SpO2 97%   BMI 35.38 kg/m     Wt Readings from Last 3 Encounters:  06/04/19 212 lb 9.6 oz (96.4 kg)  05/21/19 204 lb  (92.5 kg)  05/19/19 205 lb 12.8 oz (93.4 kg)     GEN:  Well nourished, well developed in no acute distress HEENT: Normal NECK: No JVD; No carotid bruits LYMPHATICS: No lymphadenopathy CARDIAC: RRR, no murmurs, rubs, gallops RESPIRATORY:  Clear to auscultation without rales, wheezing or rhonchi  ABDOMEN: Soft, non-tender, non-distended MUSCULOSKELETAL:  No edema; No deformity  SKIN: Warm and dry NEUROLOGIC:  Alert and oriented x 3 PSYCHIATRIC:  Normal affect   ASSESSMENT:    1. CAD in native artery   2. PAF (paroxysmal atrial fibrillation) ([place])   3. Essential hypertension   4. Pure hypercholesterolemia   5. Elevated brain natriuretic peptide (BNP) level   6. H/O kidney removal    PLAN:    In order of problems listed above:  1.  ASCAD -cardiac cath 05/21/2019 showing 80% mRCA, 60% D1 and D2 that were very small and patent LAD and LCx.  She underwent DES to the [facility] with residual 25% stenosis in the dRCA.   -she has not had any anginal sx -continue on Plavix as well as BB and statin -no ASA due to DOAC -she is concerned about going to in person cardiac rehab.  I think she is fine to do virtual cardiac rehab and she will talk with them regarding this.  2.  PAF -maintaining NSR -denies any palpitations -continue Toprol XL 12.5mg  daily and Xarelto 20mg  daily for CHADSVASC2 score of 3 -denies any bleeding problems -Hbg 11.2 2 weeks ago -she says that most of her afib episodes in the past have been at night suggesting possible correlation with OSA.  She does admit to snoring. -I will get a home sleep study to rule out OSA.  3.  HTN -BP controlled on exam -continue Toprol XL 12.5mg  daily and Altace 2.5mg  daily.  -creatinine stable at 1.2 -repeat BMET since  she is now 2 weeks out from cath  4.  HLD -LDL goal is < 70 -her last LDL was 69 in Oct 2020 -continue on atorvastatin 40mg  daily  5.  Elevated BNP -her BNP was minimally elevated prior to her cath and her HCTZ was  changed to lasix. -when she presented for cath her creatinine had bumped to 1.2 -at cath her LVEDP was low c/w volume depletion -she was told to stop diuretics due to only having 1 kidney and bump in creatinine -I will repeat her BMET today -I instructed her to avoid diuretics.  She is worried because she gets edema in the skin of her abdomen at times and in her feet.  I told her for now to only use the Lasix if her swelling is very uncomfortable -I will refer her to [place] for further evaluation and let them decide if safe to take diuretics  Medication Adjustments/Labs and Tests Ordered: Current medicines are reviewed at length with the patient today.  Concerns regarding medicines are outlined above.  Orders Placed This Encounter  Procedures  . Basic Metabolic Panel (BMET)  . Ambulatory referral to Nephrology  . Home sleep test   Meds ordered this encounter  Medications  . rivaroxaban (XARELTO) 20 MG TABS tablet    Sig: Take 1 tablet (20 mg total) by mouth daily.    Dispense:  30 tablet    Refill:  0    Signed, Fransico Him, MD  06/04/2019 4:43 PM    [place]

## 2019-06-04 ENCOUNTER — Other Ambulatory Visit: Payer: Self-pay

## 2019-06-04 ENCOUNTER — Telehealth: Payer: Self-pay | Admitting: *Deleted

## 2019-06-04 ENCOUNTER — Ambulatory Visit (INDEPENDENT_AMBULATORY_CARE_PROVIDER_SITE_OTHER): Payer: 59 | Admitting: Cardiology

## 2019-06-04 ENCOUNTER — Encounter: Payer: Self-pay | Admitting: Cardiology

## 2019-06-04 ENCOUNTER — Other Ambulatory Visit: Payer: Self-pay | Admitting: Cardiology

## 2019-06-04 VITALS — BP 132/68 | HR 70 | Ht 65.0 in | Wt 212.6 lb

## 2019-06-04 DIAGNOSIS — Z905 Acquired absence of kidney: Secondary | ICD-10-CM

## 2019-06-04 DIAGNOSIS — I10 Essential (primary) hypertension: Secondary | ICD-10-CM

## 2019-06-04 DIAGNOSIS — E7800 Pure hypercholesterolemia, unspecified: Secondary | ICD-10-CM

## 2019-06-04 DIAGNOSIS — I2510 Atherosclerotic heart disease of native coronary artery without angina pectoris: Secondary | ICD-10-CM

## 2019-06-04 DIAGNOSIS — G4733 Obstructive sleep apnea (adult) (pediatric): Secondary | ICD-10-CM

## 2019-06-04 DIAGNOSIS — R7989 Other specified abnormal findings of blood chemistry: Secondary | ICD-10-CM

## 2019-06-04 DIAGNOSIS — I480 Paroxysmal atrial fibrillation: Secondary | ICD-10-CM

## 2019-06-04 MED ORDER — PANTOPRAZOLE SODIUM 40 MG PO TBEC: 40.0000 mg | DELAYED_RELEASE_TABLET | Freq: Every day | ORAL | 3 refills | Status: DC

## 2019-06-04 MED ORDER — RIVAROXABAN 20 MG PO TABS: 20.0000 mg | ORAL_TABLET | Freq: Every day | ORAL | 0 refills | Status: DC

## 2019-06-04 MED ORDER — CLOPIDOGREL BISULFATE 75 MG PO TABS: 75.0000 mg | ORAL_TABLET | Freq: Every day | ORAL | 3 refills | Status: DC

## 2019-06-04 NOTE — Telephone Encounter (Signed)
-----   Message from Antonieta Iba, RN sent at 06/04/2019 12:16 PM EST ----- Regarding: Orders and referrals Gae Bon - order for home sleep study is in. Please call the patient with more information.  [facility] - referral placed for patient to see Dr. Ambrose Pancoast, please call to schedule.   Thanks!

## 2019-06-04 NOTE — Patient Instructions (Signed)
Medication Instructions:  Your provider recommends that you continue on your current medications as directed. Please refer to the Current Medication list given to you today.    Labwork: TODAY: BMET (kidneys and electrolytes)  Testing/Procedures: Dr. Radford Pax recommends you have a [place]. Her CPAP assistant, Gae Bon, will be in touch with you soon.  Follow-Up: You have been referred to Dr. Marval Regal (Nephrology).  Your provider recommends that you schedule a follow-up virtual appointment in 3 months.

## 2019-06-05 LAB — BASIC METABOLIC PANEL
BUN/Creatinine Ratio: 10 (ref 9–23)
BUN: 11 mg/dL (ref 6–24)
CO2: 30 mmol/L — ABNORMAL HIGH (ref 20–29)
Calcium: 9 mg/dL (ref 8.7–10.2)
Chloride: 101 mmol/L (ref 96–106)
Creatinine, Ser: 1.06 mg/dL — ABNORMAL HIGH (ref 0.57–1.00)
GFR calc Af Amer: 69 mL/min/{1.73_m2} (ref >59)
GFR calc non Af Amer: 60 mL/min/{1.73_m2} (ref >59)
Glucose: 212 mg/dL — ABNORMAL HIGH (ref 65–99)
Potassium: 4.4 mmol/L (ref 3.5–5.2)
Sodium: 142 mmol/L (ref 134–144)

## 2019-06-08 ENCOUNTER — Other Ambulatory Visit: Payer: Self-pay

## 2019-06-08 ENCOUNTER — Telehealth (HOSPITAL_COMMUNITY): Payer: Self-pay

## 2019-06-08 ENCOUNTER — Encounter (HOSPITAL_COMMUNITY)
Admission: RE | Admit: 2019-06-08 | Discharge: 2019-06-08 | Disposition: A | Discharge status: Home / Self Care | Payer: Self-pay | Source: Ambulatory Visit | Attending: Cardiology | Admitting: Cardiology

## 2019-06-08 NOTE — Telephone Encounter (Signed)
Pt called and stated Dr. Radford Pax would like her to only do our VCR program at this time, due to concerns of COVID-19. I was able to sign pt up for our VCR program.

## 2019-06-08 NOTE — Telephone Encounter (Signed)

## 2019-06-16 ENCOUNTER — Ambulatory Visit (HOSPITAL_COMMUNITY): Payer: 59

## 2019-06-17 ENCOUNTER — Other Ambulatory Visit: Payer: Self-pay | Admitting: *Deleted

## 2019-06-17 ENCOUNTER — Telehealth: Payer: Self-pay | Admitting: Cardiology

## 2019-06-17 MED ORDER — RIVAROXABAN 20 MG PO TABS: 20.0000 mg | ORAL_TABLET | Freq: Every day | ORAL | 11 refills | Status: DC

## 2019-06-17 NOTE — Telephone Encounter (Signed)
Megan  I just wanted to make sure nothing has change but I though that if a patient needed a PPI then Protonix was the safest one to use in setting of Plavix

## 2019-06-17 NOTE — Telephone Encounter (Signed)
[facility] mail order pharmacy faxed a clarification request, wanting to know if Dr. Radford Pax is aware that pantoprazole may reduce the antiplatelet activity of Clopidogrel 75 mg tablet. Order FB:[id number] PH# [phone]. Please address

## 2019-06-17 NOTE — Telephone Encounter (Signed)
Xarelto 20mg  refill request received. Pt is 53 years old, weight-96.4kg, Crea-1.06 on 06/04/2019, last seen by Dr. Radford Pax on 06/04/2019, Diagnosis-Afib, CrCl-93.21ml/min; Dose is appropriate based on dosing criteria. Will send in refill to requested pharmacy.

## 2019-06-18 NOTE — Telephone Encounter (Signed)
Yes it's still a safer option to use than Prilosec - I believe it still flags as an interaction at pharmacies to monitor. Pt is ok to take Protonix.

## 2019-06-22 ENCOUNTER — Other Ambulatory Visit: Payer: Self-pay | Admitting: Cardiology

## 2019-06-22 ENCOUNTER — Ambulatory Visit (HOSPITAL_COMMUNITY): Payer: 59

## 2019-06-22 MED ORDER — CLOPIDOGREL BISULFATE 75 MG PO TABS: 75.0000 mg | ORAL_TABLET | Freq: Every day | ORAL | 3 refills | Status: DC

## 2019-06-23 NOTE — Telephone Encounter (Signed)
Patient Name: Courtney Olson        DOB: March 11, 1966      Height: 5:5"    Weight:212lb  Office Name:[facility]         Referring Provider:TRACI TURNER  Today's Date:06/23/19  Date:   STOP BANG RISK ASSESSMENT S (snore) Have you been told that you snore?     NO   T (tired) Are you often tired, fatigued, or sleepy during the day?   NO  O (obstruction) Do you stop breathing, choke, or gasp during sleep? NO   P (pressure) Do you have or are you being treated for high blood pressure? YES   B (BMI) Is your body index greater than 35 kg/m? YES   A (age) Are you 5 years old or older? YES   N (neck) Do you have a neck circumference greater than 16 inches?   NO   G (gender) Are you a female? NO   TOTAL STOP/BANG "YES" ANSWERS                                                                        For Office Use Only              Procedure Order Form    YES to 3+ Stop Bang questions OR two clinical symptoms - patient qualifies for [employer] (CPT 95800)     Submit: This Form + Patient Face Sheet + Clinical Note via [employer] or Fax: [phone]         Clinical Notes: Will consult Sleep Specialist and refer for management of therapy due to patient increased risk of Sleep Apnea. Ordering a sleep study due to the following two clinical symptoms: Excessive daytime sleepiness G47.10 / Gastroesophageal reflux K21.9 / Nocturia R35.1 / Morning Headaches G44.221 / Difficulty concentrating R41.840 / Memory problems or poor judgment G31.84 / Personality changes or irritability R45.4 / Loud snoring R06.83 / Depression F32.9 / Unrefreshed by sleep G47.8 / Impotence N52.9 / History of high blood pressure R03.0 / Insomnia G47.00    I understand that I am proceeding with a home sleep apnea test as ordered by my treating physician. I understand that untreated sleep apnea is a serious cardiovascular risk factor and it is my responsibility to perform the test and seek management for sleep apnea. I will be  contacted with the results and be managed for sleep apnea by a local sleep physician. I will be receiving equipment and further instructions from [facility]. I shall promptly ship back the equipment via the included mailing label. I understand my insurance will be billed for the test and as the patient I am responsible for any insurance related out-of-pocket costs incurred. I have been provided with written instructions and can call for additional video or telephonic instruction, with 24-hour availability of qualified personnel to answer any questions: Patient Help Desk [phone].  Patient Signature  [name] ______________________________________________________   Date___12/8/20___________________ Patient Telemedicine Verbal Consent

## 2019-06-23 NOTE — Addendum Note (Signed)
Addended by: Freada Bergeron on: 06/23/2019 11:39 AM   Modules accepted: Orders

## 2019-06-23 NOTE — Telephone Encounter (Signed)
[place] sleep study ordered and electronically faxed.

## 2019-06-24 ENCOUNTER — Ambulatory Visit (HOSPITAL_COMMUNITY): Payer: 59

## 2019-06-26 ENCOUNTER — Ambulatory Visit (HOSPITAL_COMMUNITY): Payer: 59

## 2019-06-29 ENCOUNTER — Ambulatory Visit (HOSPITAL_COMMUNITY): Payer: 59

## 2019-06-30 ENCOUNTER — Other Ambulatory Visit: Payer: Self-pay

## 2019-06-30 ENCOUNTER — Encounter (HOSPITAL_COMMUNITY)
Admission: RE | Admit: 2019-06-30 | Discharge: 2019-06-30 | Disposition: A | Discharge status: Home / Self Care | Payer: Self-pay | Source: Ambulatory Visit | Attending: Cardiology | Admitting: Cardiology

## 2019-06-30 ENCOUNTER — Telehealth (HOSPITAL_COMMUNITY): Payer: Self-pay

## 2019-06-30 NOTE — Telephone Encounter (Signed)
Successful telephone encounter with Courtney Olson to discuss cardiac rehab virtual app and her exercise. Ms. Nolton was provided verbal exercise guideline including explanation of RPE scale. She was provided emotional support and reassurance. Clarified that virtual cardiac rehab did not include cardiac monitoring from home. She verbalized understanding. Home exercise instructions and dyspnea scale provided via mail today. Exercise prescription updated to include glucose and dyspnea monitoring. Patient appreciative of call and encouragement. Will continue to follow via virtual app. Aalijah Mims E. Laray Anger, BSN

## 2019-07-01 ENCOUNTER — Ambulatory Visit (HOSPITAL_COMMUNITY): Payer: 59

## 2019-07-01 NOTE — Telephone Encounter (Addendum)
Reached out to patient and provided the number for [facility] [phone]) and patient states she will give them a call.

## 2019-07-02 ENCOUNTER — Telehealth: Payer: Self-pay | Admitting: Cardiology

## 2019-07-02 NOTE — Telephone Encounter (Signed)
I listed the reason why she needs to nephrology visit.  Her renal function is normal at baseline and only bumped with diuretic use.  It may be a while for her to get in since she does not have severe renal dz.

## 2019-07-02 NOTE — Telephone Encounter (Signed)
New Message:    Pt would like for Dr Radford Pax to refer her to another Nephrologist. She does not want to see anybody in the group that Dr Radford Pax referred her to.  She will go to [facility] or [facility] to see a Nephrologiat.

## 2019-07-02 NOTE — Telephone Encounter (Signed)
I spoke with Courtney Olson. She has been referred to [facility] but appointment has not been made yet. She spoke with [facility] office and was told visits are assigned a level based on information provided in referral.  Courtney Olson reports prior kidney surgery and kidney disease in several family members. She is asking if our office could provide more information and help with arranging appointment. States she was told if more information was provided she could possibly get sooner appointment. She would prefer to see [facility]. If not she could see a provider in the [facility] system.  She cannot see a provider at [facility].

## 2019-07-02 NOTE — Telephone Encounter (Signed)
Last office note indicates pt has one kidney and was told to stop diuretics.  I placed call to pt to see who she has seen in the past regarding kidney issues. Left message to call office

## 2019-07-03 ENCOUNTER — Ambulatory Visit (HOSPITAL_COMMUNITY): Payer: 59

## 2019-07-03 NOTE — Telephone Encounter (Signed)
I spoke with patient and advised her on the reason she was being referred to nephrology and that it was not an urgent need. Patient states that she feels like it is urgent because of how low her GFR was and that she did not feel like it was only due to the diuretics. She also expressed that she was having pain in her back near her incision site from her partial nephrectomy. Patient also verbalized concern due to a strong family history of kidney disease. I advised her that I would call [facility] and give them more information to see if they could get her in soon but I cannot guarantee anything. Patient verbalized understanding and thanked me for the call.

## 2019-07-03 NOTE — Telephone Encounter (Signed)
Left message with [facility] to try and get the patient an appointment.

## 2019-07-06 ENCOUNTER — Ambulatory Visit (HOSPITAL_COMMUNITY): Payer: 59

## 2019-07-08 ENCOUNTER — Ambulatory Visit (HOSPITAL_COMMUNITY): Payer: 59

## 2019-07-13 ENCOUNTER — Encounter (INDEPENDENT_AMBULATORY_CARE_PROVIDER_SITE_OTHER): Payer: 59 | Admitting: Cardiology

## 2019-07-13 ENCOUNTER — Ambulatory Visit (HOSPITAL_COMMUNITY): Payer: 59

## 2019-07-13 DIAGNOSIS — G4733 Obstructive sleep apnea (adult) (pediatric): Secondary | ICD-10-CM | POA: Diagnosis not present

## 2019-07-15 ENCOUNTER — Ambulatory Visit (HOSPITAL_COMMUNITY): Payer: 59

## 2019-07-15 ENCOUNTER — Telehealth (HOSPITAL_COMMUNITY): Payer: Self-pay

## 2019-07-15 NOTE — Telephone Encounter (Signed)
CR Case Manager received incoming text message (see below) via [place] from Courtney Olson indicating she would not be using the app. Per her request, she will be discharged. Courtney Olson has been instructed to contact the department if her financial situation changes and will be enrolled in in-person CR once department transitions back to in-person exercise.  "Hi. I wanted to let you know that I am not going to be using this app. My Fitbit literally records a lot of this for me as well as heart rate, Sinus rhythm, etc. I am so busy w work and walking and feel like having to take the extra step to log in here as well is stressing me out ??. I have an appt w Dr Radford Pax soon and will talk to her about it then. I really want in person therapy but will have to pay for it out of pocket as of Jan 1 so not sure what to do about that. By the way, I've incorporated the stretches and such that you sent in the mail. Thank you."  Courtney Olson E. Laray Anger, BSN

## 2019-07-16 ENCOUNTER — Other Ambulatory Visit: Payer: Self-pay | Admitting: Cardiology

## 2019-07-20 ENCOUNTER — Ambulatory Visit (HOSPITAL_COMMUNITY): Payer: 59

## 2019-07-20 ENCOUNTER — Other Ambulatory Visit: Payer: 59

## 2019-07-20 NOTE — Procedures (Signed)
   Sleep Study Report   Patient Information Name: Courtney Olson  ID: [id number] Birth Date: Oct 20, 1965  Age: 54  Gender: Female Study Date: 07/13/2019 Referring Physician:  Fransico Him, MD  Summary & Diagnosis  TEST DESCRIPTION: Home sleep apnea testing was completed using the WatchPat, a Type 1 device, utilizing peripheral arterial tonometry (PAT), chest movement, actigraphy, pulse oximetry, pulse rate, body position and snore. AHI was calculated with apnea and hypopnea using valid sleep time as the denominator. RDI includes apneas, hypopneas, and RERAs. The data acquired and the scoring of sleep and all associated events were performed in accordance with the recommended standards and specifications as outlined in the AASM Manual for the Scoring of Sleep and Associated Events 2.2.0 (2015).  FINDINGS: 1. Moderate Obstructive Sleep Apnea with AHI 16.8/hr. 2. No significant Central Sleep Apnea. 3. Nocturnal Hypoxemia with lowest O2 saturation 74% and 11 minutes spent with O2 saturations < 88%. 4. MIld snoring noted. 5. Average heart rate was 59bpm and ranged from 38 to 99bpm. 6 . Total sleep time was 7 minutes and 43 secs. 7. Abnormal sleep architecture with 20 awakenings from sleep, shortened sleep latency onset at 5 minutes and prolonged REM sleep latency onset at 150 minutes.  DIAGNOSIS: Moderate Obstructive Sleep Apnea (G47.33) Nocturnal Hypoxemia  RECOMMENDATIONS  1. Clinical correlation of these findings is necessary. The decision to treat obstructive sleep apnea (OSA) is usually based on the presence of apnea symptoms or the presence of associated medical conditions such as Hypertension, Congestive Heart Failure, Atrial Fibrillation or Obesity. The most common symptoms of OSA are snoring, gasping for breath while sleeping, daytime sleepiness and fatigue.  2. Initiating apnea therapy is recommended given the presence of symptoms and/or associated conditions. Recommend  proceeding with one of the following:   a. Auto-CPAP therapy with a pressure range of 5-20cm H2O.   b. An oral appliance (OA) that can be obtained from certain dentists with expertise in sleep medicine. These are primarily of use in non-obese patients with mild and moderate disease.   c. An ENT consultation which may be useful to look for specific causes of obstruction and possible treatment Options.   d. If patient is intolerant to PAP therapy, consider referral to ENT for evaluation for hypoglossal nerve stimulator.   3. Close follow-up is necessary to ensure success with CPAP or oral appliance therapy for maximum benefit .   4. A follow-up oximetry study on CPAP is recommended to assess the adequacy of therapy and determine the need for supplemental oxygen or the potential need for Bi-level therapy. An arterial blood gas to determine the adequacy of baseline ventilation and oxygenation should also be considered.   5. Healthy sleep recommendations include: adequate nightly sleep (normal 7-9 hrs/night), avoidance of caffeine after noon and alcohol near bedtime, and maintaining a sleep environment that is cool, dark and quiet.  6. Weight loss for overweight patients is recommended. Even modest amounts of weight loss can significantly improve the severity of sleep apnea.   7. Snoring recommendations include: weight loss where appropriate, side sleeping, and avoidance of alcohol before bed.   8. Operation of motor vehicle or dangerous equipment must be avoided when feeling drowsy, excessively sleepy, or mentally fatigued.  Report prepared by: Signature: Georgina Peer Electronically Signed: Jul 20, 2019

## 2019-07-21 ENCOUNTER — Telehealth: Payer: Self-pay | Admitting: *Deleted

## 2019-07-21 NOTE — Telephone Encounter (Signed)
-----   Message from Sueanne Margarita, MD sent at 07/20/2019  8:40 PM EST ----- Please let patient know that they have sleep apnea and recommend auto CPAP titration through [facility].  Orders have been placed in Epic. Please set 10 week OV with me.

## 2019-07-22 ENCOUNTER — Ambulatory Visit (HOSPITAL_COMMUNITY): Payer: 59

## 2019-07-24 ENCOUNTER — Other Ambulatory Visit: Payer: Self-pay

## 2019-07-24 ENCOUNTER — Ambulatory Visit (HOSPITAL_COMMUNITY): Payer: 59

## 2019-07-24 NOTE — Telephone Encounter (Signed)
Informed patient of sleep study results and patient understanding was verbalized. Patient understands her sleep study showed that they have sleep apnea and recommend auto CPAP titration through [facility].   DME selection is [facility] Patient understands she will be contacted by [facility] to set up her cpap. Patient understands to call if [facility] does not contact her with new setup in a timely manner. Patient understands they will be called once confirmation has been received from [facility] they have received their new machine to schedule 10 week follow up appointment.  [facility] notified of new cpap order  Please add to [facility] Patient was grateful for the call and thanked me.

## 2019-07-27 ENCOUNTER — Ambulatory Visit (HOSPITAL_COMMUNITY): Payer: 59

## 2019-07-29 ENCOUNTER — Ambulatory Visit (HOSPITAL_COMMUNITY): Payer: 59

## 2019-07-31 ENCOUNTER — Ambulatory Visit (HOSPITAL_COMMUNITY): Payer: 59

## 2019-08-03 ENCOUNTER — Ambulatory Visit (HOSPITAL_COMMUNITY): Payer: 59

## 2019-08-03 ENCOUNTER — Other Ambulatory Visit: Payer: Self-pay | Admitting: Cardiology

## 2019-08-03 MED ORDER — CLOPIDOGREL BISULFATE 75 MG PO TABS: 75.0000 mg | ORAL_TABLET | Freq: Every day | ORAL | 3 refills | Status: DC

## 2019-08-05 ENCOUNTER — Ambulatory Visit (HOSPITAL_COMMUNITY): Payer: 59

## 2019-08-07 ENCOUNTER — Ambulatory Visit (HOSPITAL_COMMUNITY): Payer: 59

## 2019-08-10 ENCOUNTER — Ambulatory Visit (HOSPITAL_COMMUNITY): Payer: 59

## 2019-08-12 ENCOUNTER — Ambulatory Visit (HOSPITAL_COMMUNITY): Payer: 59

## 2019-08-14 ENCOUNTER — Ambulatory Visit (HOSPITAL_COMMUNITY): Payer: 59

## 2019-08-24 NOTE — Telephone Encounter (Signed)
Patient has a 10 week follow up appointment scheduled for 09/04/19. Patient understands she needs to keep this appointment for insurance compliance. Patient was grateful for the call and thanked me.

## 2019-09-01 ENCOUNTER — Telehealth: Payer: Self-pay | Admitting: *Deleted

## 2019-09-01 NOTE — Telephone Encounter (Signed)

## 2019-09-03 ENCOUNTER — Telehealth: Payer: Self-pay

## 2019-09-03 NOTE — Telephone Encounter (Signed)
Oh no problem - it was in my mail and I put it in the folder right before I left yesterday

## 2019-09-03 NOTE — Telephone Encounter (Signed)
This is the patient that I left you a note yesterday in the "to be signed" folder - the full report of her LE dopplers is not available and we need to get a copy of it

## 2019-09-03 NOTE — Telephone Encounter (Signed)
Called patient to reschedule her appointment tomorrow. I have rescheduled her for a virtual visit on 02/23. Patient states she has been very anxious to talk to Dr. Radford Pax because she had PVR of LE's at [facility] on 02/08 and would like her interpretation of the results. She also requested for recent lab work from [facility] to be reviewed. I advised patient that I would let Dr. Radford Pax know.

## 2019-09-04 ENCOUNTER — Telehealth: Payer: 59 | Admitting: Cardiology

## 2019-09-08 ENCOUNTER — Other Ambulatory Visit: Payer: Self-pay

## 2019-09-08 ENCOUNTER — Telehealth (INDEPENDENT_AMBULATORY_CARE_PROVIDER_SITE_OTHER): Payer: 59 | Admitting: Cardiology

## 2019-09-08 ENCOUNTER — Encounter: Payer: Self-pay | Admitting: Cardiology

## 2019-09-08 VITALS — BP 102/88 | HR 71 | Ht 65.0 in | Wt 204.0 lb

## 2019-09-08 DIAGNOSIS — G4733 Obstructive sleep apnea (adult) (pediatric): Secondary | ICD-10-CM | POA: Diagnosis not present

## 2019-09-08 DIAGNOSIS — I10 Essential (primary) hypertension: Secondary | ICD-10-CM | POA: Diagnosis not present

## 2019-09-08 DIAGNOSIS — I739 Peripheral vascular disease, unspecified: Secondary | ICD-10-CM

## 2019-09-08 DIAGNOSIS — I480 Paroxysmal atrial fibrillation: Secondary | ICD-10-CM

## 2019-09-08 NOTE — Patient Instructions (Signed)
Medication Instructions:  Your physician recommends that you continue on your current medications as directed. Please refer to the Current Medication list given to you today.  *If you need a refill on your cardiac medications before your next appointment, please call your pharmacy*  Follow-Up: At [facility], you and your health needs are our priority.  As part of our continuing mission to provide you with exceptional heart care, we have created designated Provider Care Teams.  These Care Teams include your primary Cardiologist (physician) and Advanced Practice Providers (APPs -  Physician Assistants and Nurse Practitioners) who all work together to provide you with the care you need, when you need it.  Your next appointment:   6 month(s)  The format for your next appointment:   Either In Person or Virtual  Provider:   Fransico Him, MD  Other Instructions You have been referred to Dr. Fletcher Anon.

## 2019-09-08 NOTE — Progress Notes (Signed)
Virtual Visit via Video Note   This visit type was conducted due to national recommendations for restrictions regarding the COVID-19 Pandemic (e.g. social distancing) in an effort to limit this patient's exposure and mitigate transmission in our community.  Due to her co-morbid illnesses, this patient is at least at moderate risk for complications without adequate follow up.  This format is felt to be most appropriate for this patient at this time.  All issues noted in this document were discussed and addressed.  A limited physical exam was performed with this format.  Please refer to the patient's chart for her consent to telehealth for [facility].   Evaluation Performed:  Follow-up visit  This visit type was conducted due to national recommendations for restrictions regarding the COVID-19 Pandemic (e.g. social distancing).  This format is felt to be most appropriate for this patient at this time.  All issues noted in this document were discussed and addressed.  No physical exam was performed (except for noted visual exam findings with Video Visits).  Please refer to the patient's chart (MyChart message for video visits and phone note for telephone visits) for the patient's consent to telehealth for [facility].  Date:  09/08/2019   ID:  Courtney Olson, DOB 1965-08-25, MRN [id number]  Patient Location:  Home  Provider location:   [place]  PCP:  Judge Stall, FNP (Inactive)  Cardiologist:  Fransico Him, MD  Electrophysiologist:  None   Chief Complaint:  OSA  History of Present Illness:    Courtney Olson is a 54 y.o. female who presents via audio/video conferencing for a telehealth visit today.    Courtney Olson a 54 y.o.femalewith history ofHTN, DM, paroxysmal atrial fibrillation onXarelto,vasovagal syncope, hyperlipidemia, OSA, obesity, bradycardia, CAD. When Iast saw her she was complaining of afib occurring mainly at night so I recommended a sleep study.  She underwent home  sleep study showing moderate OSA with an AHI of 16.8/hr and nocturnal hypoxemia with 11 min of O2 sats < 88% during sleep.  She was set up with auto CPAP and is now here for followup.   She is doing well with her CPAP device and thinks that she has gotten used to it.  She tolerates the nasal pillow mask and feels the pressure is adequate.  Since going on CPAP she feels rested in the am and has no significant daytime sleepiness.  She denies any significant mouth or nasal dryness or nasal congestion.  She does not think that he snores.    She also has been having numbness in her feet at times that usually gets worse as the day goes on. She thought she had neuropathy in her feet.  Her PCP referred her for ABIs of her LEs which showed increased systolic pressur in the LE but ABIs are normal and bilateral toe pressures are adequate for wound healing.  She has no pain with ambulation.  The patient does not have symptoms concerning for COVID-19 infection (fever, chills, cough, or new shortness of breath).   Prior CV studies:   The following studies were reviewed today:  Home sleep study and PAP compliance download from [facility]  Past Medical History:  Diagnosis Date  . Aortic calcification (HCC)   . Atrophic vaginitis 01/2006  . Bradycardia 05/2014  . CAD (coronary artery disease)    a. noted on cor CT.  Marland Kitchen CAD in native artery   . Chronic pancolonic ulcerative colitis ([place]) 2015   unknown if chronic.  . CKD (chronic  kidney disease), stage II   . Cytomegaloviral hepatitis ([place]) 2005  . Diabetes mellitus without complication ([place]) 99991111   on Ace inhibitor to protect kidneys  . Gallstones 01/2013  . GERD (gastroesophageal reflux disease)   . Hyperlipidemia   . Hypertension   . Kidney tumor 03/2013   Right  . Obesity   . OSA (obstructive sleep apnea)   . PAF (paroxysmal atrial fibrillation) ([place])   . Pancreatitis 2014  . Postmenopausal   . RA (rheumatoid arthritis) ([place])   . Syncope and  collapse   . Ulcerative colitis ([place]) 2015   Past Surgical History:  Procedure Laterality Date  . CESAREAN SECTION    . CHOLECYSTECTOMY  01/2013  . COLONOSCOPY  2015   Colitis  . CORONARY STENT INTERVENTION N/A 05/21/2019   Procedure: CORONARY STENT INTERVENTION;  Surgeon: Jettie Booze, MD;  Location: [facility];  Service: Cardiovascular;  Laterality: N/A;  . DILATION AND CURETTAGE OF UTERUS  2002  . HERNIA REPAIR  01/2013  . Kidney and Bladder Stent and tube placed Right 03/2013  . PARTIAL NEPHRECTOMY Right 02/2013   Tumor Removal   . Procedure to remove kidney and bladder stent and tube Right 04/2013  . RIGHT/LEFT HEART CATH AND CORONARY ANGIOGRAPHY N/A 05/21/2019   Procedure: RIGHT/LEFT HEART CATH AND CORONARY ANGIOGRAPHY;  Surgeon: Jettie Booze, MD;  Location: [facility];  Service: Cardiovascular;  Laterality: N/A;  . TOTAL ABDOMINAL HYSTERECTOMY W/ BILATERAL SALPINGOOPHORECTOMY  2002   Secondary to AUB     Current Meds  Medication Sig  . ALPRAZolam (XANAX) 0.25 MG tablet Take 1 tablet by mouth 2 (two) times daily as needed.  Marland Kitchen atorvastatin (LIPITOR) 40 MG tablet TAKE 1 TABLET(40 MG) BY MOUTH DAILY  . clopidogrel (PLAVIX) 75 MG tablet Take 1 tablet (75 mg total) by mouth daily with breakfast.  . EPINEPHrine 0.3 mg/0.3 mL IJ SOAJ injection Inject 0.3 mLs into the skin as needed.  Marland Kitchen glipiZIDE (GLUCOTROL XL) 10 MG 24 hr tablet Take 10 mg by mouth daily.  . hydrOXYzine (ATARAX/VISTARIL) 25 MG tablet Take 25 mg by mouth as needed.  . Insulin Glargine (BASAGLAR KWIKPEN) 100 UNIT/ML SOPN Inject 14 Units into the skin 2 (two) times daily.   . mesalamine (LIALDA) 1.2 G EC tablet Take 1.2 g by mouth daily.   . metoprolol succinate (TOPROL-XL) 25 MG 24 hr tablet Take 0.5 tablets (12.5 mg total) by mouth daily.  . nitroGLYCERIN (NITROSTAT) 0.4 MG SL tablet Place 1 tablet (0.4 mg total) under the tongue every 5 (five) minutes as needed.  . pantoprazole (PROTONIX)  40 MG tablet TAKE 1 TABLET(40 MG) BY MOUTH DAILY  . ramipril (ALTACE) 2.5 MG capsule Take 1 capsule (2.5 mg total) by mouth daily. (Patient taking differently: Take 5 mg by mouth daily. )  . rivaroxaban (XARELTO) 20 MG TABS tablet Take 1 tablet (20 mg total) by mouth daily.  . TRULICITY 1.5 0000000 SOPN Inject 1.5 mg into the skin every 7 (seven) days.      Allergies:   Orange fruit [citrus], Bactrim [sulfamethoxazole-trimethoprim], and Tape   Social History   Tobacco Use  . Smoking status: Never Smoker  . Smokeless tobacco: Never Used  Substance Use Topics  . Alcohol use: Yes    Comment: 2 a month  . Drug use: No     Family Hx: The patient's family history includes Cancer in her maternal grandmother and paternal aunt; Diabetes in her father, maternal grandmother,  mother, and paternal aunt; Fibromyalgia in her mother; Heart disease in her father; Hypertension in her father, mother, and sister. There is no history of Breast cancer.  ROS:   Please see the history of present illness.     All other systems reviewed and are negative.   Labs/Other Tests and Data Reviewed:    Recent Labs: 05/15/2019: NT-Pro BNP 382 05/19/2019: Platelets 201 05/21/2019: Hemoglobin 11.2 06/04/2019: BUN 11; Creatinine, Ser 1.06; Potassium 4.4; Sodium 142   Recent Lipid Panel Lab Results  Component Value Date/Time   CHOL 232 (H) 05/17/2014 11:13 AM   TRIG 172.0 (H) 05/17/2014 11:13 AM   HDL 55.50 05/17/2014 11:13 AM   CHOLHDL 4 05/17/2014 11:13 AM   LDLCALC 142 (H) 05/17/2014 11:13 AM    Wt Readings from Last 3 Encounters:  09/08/19 204 lb (92.5 kg)  06/04/19 212 lb 9.6 oz (96.4 kg)  05/21/19 204 lb (92.5 kg)     Objective:    Vital Signs:  BP 102/88   Pulse 71   Ht 5\' 5"  (1.651 m)   Wt 204 lb (92.5 kg)   LMP 07/16/2000 (Approximate)   SpO2 100%   BMI 33.95 kg/m    CONSTITUTIONAL:  Well nourished, well developed female in no acute distress.  EYES: anicteric MOUTH: oral mucosa is  pink RESPIRATORY: Normal respiratory effort, symmetric expansion CARDIOVASCULAR: No peripheral edema SKIN: No rash, lesions or ulcers MUSCULOSKELETAL: no digital cyanosis NEURO: Cranial Nerves II-XII grossly intact, moves all extremities PSYCH: Intact judgement and insight.  A&O x 3, Mood/affect appropriate   ASSESSMENT & PLAN:    1.  OSA -  The patient is tolerating PAP therapy well without any problems. The PAP download was reviewed today and showed an AHI of 0.3/hr on auto PAP with 83% compliance in using more than 4 hours nightly.  The patient has been using and benefiting from PAP use and will continue to benefit from therapy.   2.  HTN -BP controlled on exam -continue Toprol XL 12.5mg  daily and Altace 2.5mg  daily  3.  PAF -palpitations at night have seemed to subside after starting PAP therapy for OSA -continue Toprol and Xarelto 20mg  daily  4.  Foot numbness -she has been having foot numbness and PVD showed increased BPs in the LEs but normal AIBs -she is very concerned about the foot numbness and PV study -I have recommended that she be seen by Dr. Fletcher Anon  COVID-19 Education: The signs and symptoms of COVID-19 were discussed with the patient and how to seek care for testing (follow up with PCP or arrange E-visit).  The importance of social distancing was discussed today.  Patient Risk:   After full review of this patient's clinical status, I feel that they are at least moderate risk at this time.  Time:   Today, I have spent 40 minutes on telemedicine directly with patient discussing medical problems including OSA, HTN, PAF and reviewing patient's chart including sleep study, prior OV notes and PAP compliance download on [facility], LE PV dopplers from PCP.  Medication Adjustments/Labs and Tests Ordered: Current medicines are reviewed at length with the patient today.  Concerns regarding medicines are outlined above.  Tests Ordered: No orders of the defined types were placed  in this encounter.  Medication Changes: No orders of the defined types were placed in this encounter.   Disposition:  Follow up in 6 month(s)  Signed, Fransico Him, MD  09/08/2019 9:23 AM    [facility]

## 2019-10-06 ENCOUNTER — Ambulatory Visit: Payer: 59 | Admitting: Cardiovascular Disease

## 2019-10-12 ENCOUNTER — Ambulatory Visit
Admission: RE | Admit: 2019-10-12 | Discharge: 2019-10-12 | Disposition: A | Discharge status: Home / Self Care | Payer: 59 | Source: Ambulatory Visit | Attending: Family Medicine | Admitting: Family Medicine

## 2019-10-12 ENCOUNTER — Other Ambulatory Visit: Payer: Self-pay | Admitting: Family Medicine

## 2019-10-12 ENCOUNTER — Other Ambulatory Visit: Payer: Self-pay

## 2019-10-12 DIAGNOSIS — Z1231 Encounter for screening mammogram for malignant neoplasm of breast: Secondary | ICD-10-CM

## 2019-10-13 ENCOUNTER — Other Ambulatory Visit: Payer: Self-pay | Admitting: Family Medicine

## 2019-10-13 ENCOUNTER — Other Ambulatory Visit: Payer: Self-pay | Admitting: Obstetrics and Gynecology

## 2019-10-13 DIAGNOSIS — N6489 Other specified disorders of breast: Secondary | ICD-10-CM

## 2019-10-15 ENCOUNTER — Ambulatory Visit
Admission: RE | Admit: 2019-10-15 | Discharge: 2019-10-15 | Disposition: A | Discharge status: Home / Self Care | Payer: 59 | Source: Ambulatory Visit | Attending: Obstetrics and Gynecology | Admitting: Obstetrics and Gynecology

## 2019-10-15 ENCOUNTER — Encounter: Payer: Self-pay | Admitting: Obstetrics and Gynecology

## 2019-10-15 ENCOUNTER — Ambulatory Visit (INDEPENDENT_AMBULATORY_CARE_PROVIDER_SITE_OTHER): Payer: 59 | Admitting: Obstetrics and Gynecology

## 2019-10-15 ENCOUNTER — Other Ambulatory Visit: Payer: Self-pay

## 2019-10-15 VITALS — BP 122/64 | HR 83 | Temp 97.9°F | Ht 64.5 in | Wt 202.0 lb

## 2019-10-15 DIAGNOSIS — N6489 Other specified disorders of breast: Secondary | ICD-10-CM

## 2019-10-15 DIAGNOSIS — E28319 Asymptomatic premature menopause: Secondary | ICD-10-CM

## 2019-10-15 DIAGNOSIS — Z01419 Encounter for gynecological examination (general) (routine) without abnormal findings: Secondary | ICD-10-CM

## 2019-10-15 DIAGNOSIS — E2839 Other primary ovarian failure: Secondary | ICD-10-CM | POA: Diagnosis not present

## 2019-10-15 NOTE — Patient Instructions (Addendum)
Call the breast center to schedule your bone density (DEXA)  EXERCISE AND DIET:  We recommended that you start or continue a regular exercise program for good health. Regular exercise means any activity that makes your heart beat faster and makes you sweat.  We recommend exercising at least 30 minutes per day at least 3 days a week, preferably 4 or 5.  We also recommend a diet low in fat and sugar.  Inactivity, poor dietary choices and obesity can cause diabetes, heart attack, stroke, and kidney damage, among others.    ALCOHOL AND SMOKING:  Women should limit their alcohol intake to no more than 7 drinks/beers/glasses of wine (combined, not each!) per week. Moderation of alcohol intake to this level decreases your risk of breast cancer and liver damage. And of course, no recreational drugs are part of a healthy lifestyle.  And absolutely no smoking or even second hand smoke. Most people know smoking can cause heart and lung diseases, but did you know it also contributes to weakening of your bones? Aging of your skin?  Yellowing of your teeth and nails?  CALCIUM AND VITAMIN D:  Adequate intake of calcium and Vitamin D are recommended.  The recommendations for exact amounts of these supplements seem to change often, but generally speaking 1,200 mg of calcium (between diet and supplement) and 800 units of Vitamin D per day seems prudent. Certain women may benefit from higher intake of Vitamin D.  If you are among these women, your doctor will have told you during your visit.    PAP SMEARS:  Pap smears, to check for cervical cancer or precancers,  have traditionally been done yearly, although recent scientific advances have shown that most women can have pap smears less often.  However, every woman still should have a physical exam from her gynecologist every year. It will include a breast check, inspection of the vulva and vagina to check for abnormal growths or skin changes, a visual exam of the cervix, and  then an exam to evaluate the size and shape of the uterus and ovaries.  And after 54 years of age, a rectal exam is indicated to check for rectal cancers. We will also provide age appropriate advice regarding health maintenance, like when you should have certain vaccines, screening for sexually transmitted diseases, bone density testing, colonoscopy, mammograms, etc.   MAMMOGRAMS:  All women over 55 years old should have a yearly mammogram. Many facilities now offer a "3D" mammogram, which may cost around $50 extra out of pocket. If possible,  we recommend you accept the option to have the 3D mammogram performed.  It both reduces the number of women who will be called back for extra views which then turn out to be normal, and it is better than the routine mammogram at detecting truly abnormal areas.    COLON CANCER SCREENING: Now recommend starting at age 14. At this time colonoscopy is not covered for routine screening until 50. There are take home tests that can be done between 45-49.   COLONOSCOPY:  Colonoscopy to screen for colon cancer is recommended for all women at age 8.  We know, you hate the idea of the prep.  We agree, BUT, having colon cancer and not knowing it is worse!!  Colon cancer so often starts as a polyp that can be seen and removed at colonscopy, which can quite literally save your life!  And if your first colonoscopy is normal and you have no family history of  colon cancer, most women don't have to have it again for 10 years.  Once every ten years, you can do something that may end up saving your life, right?  We will be happy to help you get it scheduled when you are ready.  Be sure to check your insurance coverage so you understand how much it will cost.  It may be covered as a preventative service at no cost, but you should check your particular policy.      Breast Self-Awareness Breast self-awareness means being familiar with how your breasts look and feel. It involves checking  your breasts regularly and reporting any changes to your health care provider. Practicing breast self-awareness is important. A change in your breasts can be a sign of a serious medical problem. Being familiar with how your breasts look and feel allows you to find any problems early, when treatment is more likely to be successful. All women should practice breast self-awareness, including women who have had breast implants. How to do a breast self-exam One way to learn what is normal for your breasts and whether your breasts are changing is to do a breast self-exam. To do a breast self-exam: Look for Changes  1. Remove all the clothing above your waist. 2. Stand in front of a mirror in a room with good lighting. 3. Put your hands on your hips. 4. Push your hands firmly downward. 5. Compare your breasts in the mirror. Look for differences between them (asymmetry), such as: ? Differences in shape. ? Differences in size. ? Puckers, dips, and bumps in one breast and not the other. 6. Look at each breast for changes in your skin, such as: ? Redness. ? Scaly areas. 7. Look for changes in your nipples, such as: ? Discharge. ? Bleeding. ? Dimpling. ? Redness. ? A change in position. Feel for Changes Carefully feel your breasts for lumps and changes. It is best to do this while lying on your back on the floor and again while sitting or standing in the shower or tub with soapy water on your skin. Feel each breast in the following way:  Place the arm on the side of the breast you are examining above your head.  Feel your breast with the other hand.  Start in the nipple area and make  inch (2 cm) overlapping circles to feel your breast. Use the pads of your three middle fingers to do this. Apply light pressure, then medium pressure, then firm pressure. The light pressure will allow you to feel the tissue closest to the skin. The medium pressure will allow you to feel the tissue that is a little  deeper. The firm pressure will allow you to feel the tissue close to the ribs.  Continue the overlapping circles, moving downward over the breast until you feel your ribs below your breast.  Move one finger-width toward the center of the body. Continue to use the  inch (2 cm) overlapping circles to feel your breast as you move slowly up toward your collarbone.  Continue the up and down exam using all three pressures until you reach your armpit.  Write Down What You Find  Write down what is normal for each breast and any changes that you find. Keep a written record with breast changes or normal findings for each breast. By writing this information down, you do not need to depend only on memory for size, tenderness, or location. Write down where you are in your menstrual cycle, if  you are still menstruating. If you are having trouble noticing differences in your breasts, do not get discouraged. With time you will become more familiar with the variations in your breasts and more comfortable with the exam. How often should I examine my breasts? Examine your breasts every month. If you are breastfeeding, the best time to examine your breasts is after a feeding or after using a breast pump. If you menstruate, the best time to examine your breasts is 5-7 days after your period is over. During your period, your breasts are lumpier, and it may be more difficult to notice changes. When should I see my health care provider? See your health care provider if you notice:  A change in shape or size of your breasts or nipples.  A change in the skin of your breast or nipples, such as a reddened or scaly area.  Unusual discharge from your nipples.  A lump or thick area that was not there before.  Pain in your breasts.  Anything that concerns you.

## 2019-10-15 NOTE — Progress Notes (Signed)
54 y.o. [id number] Married White or Caucasian Not Hispanic or Latino female here for annual exam.  She had a hysterectomy/bso at 40. She hasn't been on estrogen since 2014  She has had multiple medical issues. Had a partial nephrectomy in 2014, benign mass. H/O Diabetes, heart disease, kidney disease, ulcerative colitis She had a angioplasty and cardiac stent last year, cardiac rehab canceled secondary to covid. She has afib, comes and goes. Started CPAP machine.   She noticed a lump in her left breast, had a bruise, imaging today with fat necrosis.   She lost a lot of weight years ago.     Patient's last menstrual period was 07/16/2000 (approximate).          Sexually active: Yes.    The current method of family planning is status post hysterectomy.    Exercising: Yes.    fit bit walking  Smoker:  no  Health Maintenance: Pap: 07/12/14 Negative (hysterectomy)             2003, Negative History of abnormal Pap:  no MMG:  10/15/19 Diag left breast 1.9 cm area of fat necrosis and developing oil cyst formation in the 11:30 o'clock position of the left breast. No evidence of malignancy. BMD:   None  Colonoscopy: 2 years ago TDaP:  Unsure, just had her covid shot 2 days ago.   Gardasil: NA    reports that she has never smoked. She has never used smokeless tobacco. She reports current alcohol use. She reports that she does not use drugs. 58 and 54 year old girls, 30 year old son. Son goes to [employer].   Past Medical History:  Diagnosis Date  . Aortic calcification (HCC)   . Atrophic vaginitis 01/2006  . Bradycardia 05/2014  . CAD (coronary artery disease)    cardiac cath 05/21/2019 showing 29 mRCA, 60% D1 and D2 that were very small and patent LAD and LCx.  She underwent DES to the [facility] with residual 25% stenosis in the dRCA  . Chronic pancolonic ulcerative colitis ([place]) 2015   unknown if chronic.  . CKD (chronic kidney disease), stage II   . Cytomegaloviral hepatitis ([place]) 2005  .  Diabetes mellitus without complication ([place]) 99991111   on Ace inhibitor to protect kidneys  . Gallstones 01/2013  . GERD (gastroesophageal reflux disease)   . Hyperlipidemia   . Hypertension   . Kidney tumor 03/2013   Right  . Obesity   . OSA (obstructive sleep apnea)   . PAF (paroxysmal atrial fibrillation) ([place])   . Pancreatitis 2014  . Postmenopausal   . RA (rheumatoid arthritis) ([place])   . Syncope and collapse   . Ulcerative colitis ([place]) 2015    Past Surgical History:  Procedure Laterality Date  . CESAREAN SECTION    . CHOLECYSTECTOMY  01/2013  . COLONOSCOPY  2015   Colitis  . CORONARY STENT INTERVENTION N/A 05/21/2019   Procedure: CORONARY STENT INTERVENTION;  Surgeon: Jettie Booze, MD;  Location: [facility];  Service: Cardiovascular;  Laterality: N/A;  . DILATION AND CURETTAGE OF UTERUS  2002  . HERNIA REPAIR  01/2013  . Kidney and Bladder Stent and tube placed Right 03/2013  . PARTIAL NEPHRECTOMY Right 02/2013   Tumor Removal   . Procedure to remove kidney and bladder stent and tube Right 04/2013  . RIGHT/LEFT HEART CATH AND CORONARY ANGIOGRAPHY N/A 05/21/2019   Procedure: RIGHT/LEFT HEART CATH AND CORONARY ANGIOGRAPHY;  Surgeon: Jettie Booze, MD;  Location: [facility]  INVASIVE CV LAB;  Service: Cardiovascular;  Laterality: N/A;  . TOTAL ABDOMINAL HYSTERECTOMY W/ BILATERAL SALPINGOOPHORECTOMY  2002   Secondary to AUB    Current Outpatient Medications  Medication Sig Dispense Refill  . ALPRAZolam (XANAX) 0.25 MG tablet Take 1 tablet by mouth 2 (two) times daily as needed.  0  . atorvastatin (LIPITOR) 40 MG tablet TAKE 1 TABLET(40 MG) BY MOUTH DAILY 90 tablet 3  . clopidogrel (PLAVIX) 75 MG tablet Take 1 tablet (75 mg total) by mouth daily with breakfast. 90 tablet 3  . EPINEPHrine 0.3 mg/0.3 mL IJ SOAJ injection Inject 0.3 mLs into the skin as needed.  2  . glipiZIDE (GLUCOTROL XL) 10 MG 24 hr tablet Take 10 mg by mouth daily.  2  . hydrOXYzine  (ATARAX/VISTARIL) 25 MG tablet Take 25 mg by mouth as needed.    . Insulin Glargine (BASAGLAR KWIKPEN) 100 UNIT/ML SOPN Inject 14 Units into the skin 2 (two) times daily.   5  . mesalamine (LIALDA) 1.2 G EC tablet Take 1.2 g by mouth daily.     . metoprolol succinate (TOPROL-XL) 25 MG 24 hr tablet Take 0.5 tablets (12.5 mg total) by mouth daily. 45 tablet 3  . nitroGLYCERIN (NITROSTAT) 0.4 MG SL tablet Place 1 tablet (0.4 mg total) under the tongue every 5 (five) minutes as needed. 25 tablet 2  . pantoprazole (PROTONIX) 40 MG tablet TAKE 1 TABLET(40 MG) BY MOUTH DAILY 30 tablet 3  . ramipril (ALTACE) 5 MG capsule Take 5 mg by mouth daily.    . rivaroxaban (XARELTO) 20 MG TABS tablet Take 1 tablet (20 mg total) by mouth daily. 30 tablet 11  . TRULICITY 1.5 0000000 SOPN Inject 1.5 mg into the skin every 7 (seven) days.     . furosemide (LASIX) 20 MG tablet Take 20 mg by mouth daily.     No current facility-administered medications for this visit.    Family History  Problem Relation Age of Onset  . Diabetes Mother   . Hypertension Mother   . Fibromyalgia Mother   . Hypertension Father   . Heart disease Father        CABG  . Diabetes Father   . Hypertension Sister   . Diabetes Paternal Aunt   . Cancer Paternal Aunt        bone  . Cancer Maternal Grandmother        ovarian  . Diabetes Maternal Grandmother   . Breast cancer Neg Hx     Review of Systems  Constitutional: Negative.   HENT: Negative.   Eyes: Negative.   Respiratory: Negative.   Cardiovascular: Negative.   Gastrointestinal: Negative.   Endocrine: Negative.   Genitourinary: Negative.   Musculoskeletal: Negative.   Skin: Negative.   Neurological: Negative.   Hematological: Negative.   Psychiatric/Behavioral: Negative.     Exam:   BP 122/64   Pulse 83   Temp 97.9 F (36.6 C)   Ht 5' 4.5" (1.638 m)   Wt 202 lb (91.6 kg)   LMP 07/16/2000 (Approximate)   SpO2 94%   BMI 34.14 kg/m   Weight change:  @WEIGHTCHANGE @ Height:   Height: 5' 4.5" (163.8 cm)  Ht Readings from Last 3 Encounters:  10/15/19 5' 4.5" (1.638 m)  09/08/19 5\' 5"  (1.651 m)  06/04/19 5\' 5"  (1.651 m)    General appearance: alert, cooperative and appears stated age Head: Normocephalic, without obvious abnormality, atraumatic Neck: no adenopathy, supple, symmetrical, trachea midline and thyroid normal to  inspection and palpation Lungs: clear to auscultation bilaterally Cardiovascular: regular rate and rhythm Breasts: small lump in the periphery of the left breast at 11 o'lcock (negative imaging today). No other lumps, no skin changes.,  Abdomen: soft, non-tender; non distended,  no masses,  no organomegaly Extremities: extremities normal, atraumatic, no cyanosis or edema Skin: Skin color, texture, turgor normal. No rashes or lesions Lymph nodes: Cervical, supraclavicular, and axillary nodes normal. No abnormal inguinal nodes palpated Neurologic: Grossly normal   Pelvic: External genitalia:  no lesions              Urethra:  normal appearing urethra with no masses, tenderness or lesions              Bartholins and Skenes: normal                 Vagina: normal appearing vagina with normal color and discharge, no lesions              Cervix: absent               Bimanual Exam:  Uterus:  uterus absent              Adnexa: no mass, fullness, tenderness               Rectovaginal: Confirms               Anus:  normal sphincter tone, no lesions  Terence Lux chaperoned for the exam.  A:  Well Woman with normal exam  Early menopause  Multiple medical issues, managed by primary, cardiology and nephrology  P:   No pap needed  Diagnostic imaging done today, benign  Colonoscopy UTD  DEXA ordered  Labs with primary  Discussed breast self exam  Discussed calcium and vit D intake

## 2019-10-19 ENCOUNTER — Ambulatory Visit: Payer: BLUE CROSS/BLUE SHIELD | Admitting: Obstetrics and Gynecology

## 2019-10-22 ENCOUNTER — Other Ambulatory Visit: Payer: 59

## 2019-11-13 ENCOUNTER — Other Ambulatory Visit: Payer: Self-pay | Admitting: Obstetrics and Gynecology

## 2019-11-13 DIAGNOSIS — E28319 Asymptomatic premature menopause: Secondary | ICD-10-CM

## 2019-11-13 DIAGNOSIS — E2839 Other primary ovarian failure: Secondary | ICD-10-CM

## 2019-11-17 ENCOUNTER — Encounter: Payer: Self-pay | Admitting: Cardiovascular Disease

## 2019-11-17 ENCOUNTER — Other Ambulatory Visit: Payer: Self-pay

## 2019-11-17 ENCOUNTER — Ambulatory Visit (INDEPENDENT_AMBULATORY_CARE_PROVIDER_SITE_OTHER): Payer: 59 | Admitting: Cardiovascular Disease

## 2019-11-17 VITALS — BP 142/82 | HR 71 | Ht 65.0 in | Wt 209.4 lb

## 2019-11-17 DIAGNOSIS — I480 Paroxysmal atrial fibrillation: Secondary | ICD-10-CM

## 2019-11-17 DIAGNOSIS — E7800 Pure hypercholesterolemia, unspecified: Secondary | ICD-10-CM | POA: Diagnosis not present

## 2019-11-17 DIAGNOSIS — I739 Peripheral vascular disease, unspecified: Secondary | ICD-10-CM | POA: Diagnosis not present

## 2019-11-17 DIAGNOSIS — I2510 Atherosclerotic heart disease of native coronary artery without angina pectoris: Secondary | ICD-10-CM | POA: Diagnosis not present

## 2019-11-17 NOTE — Patient Instructions (Signed)
Medication Instructions:  No changes *If you need a refill on your cardiac medications before your next appointment, please call your pharmacy*   Lab Work: None ordered If you have labs (blood work) drawn today and your tests are completely normal, you will receive your results only by: Marland Kitchen MyChart Message (if you have MyChart) OR . A paper copy in the mail If you have any lab test that is abnormal or we need to change your treatment, we will call you to review the results.   Testing/Procedures: Your physician has requested that you have a lower extremity arterial duplex. During this test, ultrasound is used to evaluate arterial blood flow in the legs. Allow one hour for this exam. There are no restrictions or special instructions. This will take place at [address], [address].  Follow-Up: At [facility], you and your health needs are our priority.  As part of our continuing mission to provide you with exceptional heart care, we have created designated Provider Care Teams.  These Care Teams include your primary Cardiologist (physician) and Advanced Practice Providers (APPs -  Physician Assistants and Nurse Practitioners) who all work together to provide you with the care you need, when you need it.  We recommend signing up for the patient portal called "MyChart".  Sign up information is provided on this After Visit Summary.  MyChart is used to connect with patients for Virtual Visits (Telemedicine).  Patients are able to view lab/test results, encounter notes, upcoming appointments, etc.  Non-urgent messages can be sent to your provider as well.   To learn more about what you can do with MyChart, go to [email or web address].    Your next appointment:   As needed with Dr. Fletcher Anon

## 2019-11-17 NOTE — Progress Notes (Signed)
Cardiology Office Note   Date:  11/19/2019   ID:  Courtney Olson, DOB February 04, 1966, MRN [id number]  PCP:  Judge Stall, FNP (Inactive)  Cardiologist:  Dr. Radford Pax  No chief complaint on file.     History of Present Illness: Courtney Olson is a 54 y.o. female who was referred by Dr. Radford Pax for evaluation and management of peripheral arterial disease. She has known history of essential hypertension, diabetes mellitus, paroxysmal atrial fibrillation on anticoagulation with Xarelto, vasovagal syncope, hyperlipidemia, obstructive sleep apnea, obesity and coronary artery disease.  She had RCA PCI in November 2020. She recently complained of numbness in her feet.  She denies any calf or thigh claudication.  She underwent lower extremity arterial Doppler at [facility] which showed evidence of noncompressible vessels.  Toe pressure was normal.  Femoral waveforms were triphasic.  Past Medical History:  Diagnosis Date  . Aortic calcification (HCC)   . Atrophic vaginitis 01/2006  . Bradycardia 05/2014  . CAD (coronary artery disease)    cardiac cath 05/21/2019 showing 26 mRCA, 60% D1 and D2 that were very small and patent LAD and LCx.  She underwent DES to the [facility] with residual 25% stenosis in the dRCA  . Chronic pancolonic ulcerative colitis ([place]) 2015   unknown if chronic.  . CKD (chronic kidney disease), stage II   . Cytomegaloviral hepatitis ([place]) 2005  . Diabetes mellitus without complication ([place]) 99991111   on Ace inhibitor to protect kidneys  . Gallstones 01/2013  . GERD (gastroesophageal reflux disease)   . Hyperlipidemia   . Hypertension   . Kidney tumor 03/2013   Right  . Obesity   . OSA (obstructive sleep apnea)   . PAF (paroxysmal atrial fibrillation) ([place])   . Pancreatitis 2014  . Postmenopausal   . RA (rheumatoid arthritis) ([place])   . Syncope and collapse   . Ulcerative colitis ([place]) 2015    Past Surgical History:  Procedure Laterality Date  . CESAREAN SECTION    .  CHOLECYSTECTOMY  01/2013  . COLONOSCOPY  2015   Colitis  . CORONARY STENT INTERVENTION N/A 05/21/2019   Procedure: CORONARY STENT INTERVENTION;  Surgeon: Jettie Booze, MD;  Location: [place] [facility];  Service: Cardiovascular;  Laterality: N/A;  . DILATION AND CURETTAGE OF UTERUS  2002  . HERNIA REPAIR  01/2013  . Kidney and Bladder Stent and tube placed Right 03/2013  . PARTIAL NEPHRECTOMY Right 02/2013   Tumor Removal   . Procedure to remove kidney and bladder stent and tube Right 04/2013  . RIGHT/LEFT HEART CATH AND CORONARY ANGIOGRAPHY N/A 05/21/2019   Procedure: RIGHT/LEFT HEART CATH AND CORONARY ANGIOGRAPHY;  Surgeon: Jettie Booze, MD;  Location: [place] [facility];  Service: Cardiovascular;  Laterality: N/A;  . TOTAL ABDOMINAL HYSTERECTOMY W/ BILATERAL SALPINGOOPHORECTOMY  2002   Secondary to AUB     Current Outpatient Medications  Medication Sig Dispense Refill  . ALPRAZolam (XANAX) 0.25 MG tablet Take 1 tablet by mouth 2 (two) times daily as needed.  0  . atorvastatin (LIPITOR) 40 MG tablet TAKE 1 TABLET(40 MG) BY MOUTH DAILY 90 tablet 3  . clopidogrel (PLAVIX) 75 MG tablet Take 1 tablet (75 mg total) by mouth daily with breakfast. 90 tablet 3  . EPINEPHrine 0.3 mg/0.3 mL IJ SOAJ injection Inject 0.3 mLs into the skin as needed.  2  . furosemide (LASIX) 20 MG tablet Take 20 mg by mouth daily.    Marland Kitchen glipiZIDE (GLUCOTROL XL) 10 MG 24  hr tablet Take 10 mg by mouth daily.  2  . hydrOXYzine (ATARAX/VISTARIL) 25 MG tablet Take 25 mg by mouth as needed.    . Insulin Glargine (BASAGLAR KWIKPEN) 100 UNIT/ML SOPN Inject 14 Units into the skin 2 (two) times daily.   5  . mesalamine (LIALDA) 1.2 G EC tablet Take 1.2 g by mouth daily.     . metoprolol succinate (TOPROL-XL) 25 MG 24 hr tablet Take 0.5 tablets (12.5 mg total) by mouth daily. 45 tablet 3  . nitroGLYCERIN (NITROSTAT) 0.4 MG SL tablet Place 1 tablet (0.4 mg total) under the tongue every 5 (five) minutes as needed. 25  tablet 2  . pantoprazole (PROTONIX) 40 MG tablet TAKE 1 TABLET(40 MG) BY MOUTH DAILY 30 tablet 3  . ramipril (ALTACE) 5 MG capsule Take 5 mg by mouth daily.    . rivaroxaban (XARELTO) 20 MG TABS tablet Take 1 tablet (20 mg total) by mouth daily. 30 tablet 11  . TRULICITY 1.5 0000000 SOPN Inject 1.5 mg into the skin every 7 (seven) days.      No current facility-administered medications for this visit.    Allergies:   Orange fruit [citrus], Bactrim [sulfamethoxazole-trimethoprim], and Tape    Social History:  The patient  reports that she has never smoked. She has never used smokeless tobacco. She reports current alcohol use. She reports that she does not use drugs.   Family History:  The patient's family history includes Cancer in her maternal grandmother and paternal aunt; Diabetes in her father, maternal grandmother, mother, and paternal aunt; Fibromyalgia in her mother; Heart disease in her father; Hypertension in her father, mother, and sister.    ROS:  Please see the history of present illness.   Otherwise, review of systems are positive for none.   All other systems are reviewed and negative.    PHYSICAL EXAM: VS:  BP (!) 142/82   Pulse 71   Ht 5\' 5"  (1.651 m)   Wt 209 lb 6.4 oz (95 kg)   LMP 07/16/2000 (Approximate)   SpO2 98%   BMI 34.85 kg/m  , BMI Body mass index is 34.85 kg/m. GEN: Well nourished, well developed, in no acute distress  HEENT: normal  Neck: no JVD, carotid bruits, or masses Cardiac: RRR; no murmurs, rubs, or gallops,no edema  Respiratory:  clear to auscultation bilaterally, normal work of breathing GI: soft, nontender, nondistended, + BS MS: no deformity or atrophy  Skin: warm and dry, no rash Neuro:  Strength and sensation are intact Psych: euthymic mood, full affect Vascular: Femoral pulses +2 bilaterally.  Dorsalis pedis is +2 on the right and absent on the left.  Posterior tibial is +1 on the right and +2 on the left.  EKG:  EKG is not ordered  today.    Recent Labs: 05/15/2019: NT-Pro BNP 382 05/19/2019: Platelets 201 05/21/2019: Hemoglobin 11.2 06/04/2019: BUN 11; Creatinine, Ser 1.06; Potassium 4.4; Sodium 142    Lipid Panel    Component Value Date/Time   CHOL 232 (H) 05/17/2014 1113   TRIG 172.0 (H) 05/17/2014 1113   HDL 55.50 05/17/2014 1113   CHOLHDL 4 05/17/2014 1113   VLDL 34.4 05/17/2014 1113   LDLCALC 142 (H) 05/17/2014 1113      Wt Readings from Last 3 Encounters:  11/17/19 209 lb 6.4 oz (95 kg)  10/15/19 202 lb (91.6 kg)  09/08/19 204 lb (92.5 kg)        No flowsheet data found.    ASSESSMENT AND  PLAN:  1.  Peripheral arterial disease: Patient's recent arterial Doppler at [facility] seemed suggestive of noncompressible vessels but toe pressure was overall normal.  By physical exam today, she has abnormal tibial pedal pulses suggestive of below the knee disease. Certainly, some of the numbness in her feet is likely related to diabetic peripheral neuropathy. However, PAD might also be contributing. I am going to obtain lower extremity arterial duplex for further evaluation.  2. Coronary artery disease involving native coronary arteries without angina: Status post RCA PCI. Continue treatment with Plavix.  3. Paroxysmal atrial fibrillation: On long-term anticoagulation with Xarelto.  4. Hyperlipidemia: Currently on atorvastatin 40 mg daily. Most recent lipid profile and October showed an LDL of 69.     Disposition:   I doubt that the patient will require any revascularization for PAD. We will decide on her follow-up after duplex.  Signed,  Kathlyn Sacramento, MD  11/19/2019 7:20 AM    [facility]

## 2019-11-19 ENCOUNTER — Other Ambulatory Visit: Payer: Self-pay | Admitting: Cardiovascular Disease

## 2019-11-19 DIAGNOSIS — I739 Peripheral vascular disease, unspecified: Secondary | ICD-10-CM

## 2019-11-20 ENCOUNTER — Ambulatory Visit (HOSPITAL_COMMUNITY)
Admission: RE | Admit: 2019-11-20 | Discharge: 2019-11-20 | Disposition: A | Discharge status: Home / Self Care | Payer: 59 | Source: Ambulatory Visit | Attending: Cardiology | Admitting: Cardiology

## 2019-11-20 ENCOUNTER — Other Ambulatory Visit: Payer: Self-pay | Admitting: Cardiovascular Disease

## 2019-11-20 ENCOUNTER — Other Ambulatory Visit: Payer: Self-pay

## 2019-11-20 DIAGNOSIS — I739 Peripheral vascular disease, unspecified: Secondary | ICD-10-CM | POA: Diagnosis present

## 2020-01-22 ENCOUNTER — Ambulatory Visit
Admission: RE | Admit: 2020-01-22 | Discharge: 2020-01-22 | Disposition: A | Discharge status: Home / Self Care | Payer: 59 | Source: Ambulatory Visit | Attending: Obstetrics and Gynecology | Admitting: Obstetrics and Gynecology

## 2020-01-22 ENCOUNTER — Other Ambulatory Visit: Payer: Self-pay

## 2020-01-22 DIAGNOSIS — E28319 Asymptomatic premature menopause: Secondary | ICD-10-CM

## 2020-01-22 DIAGNOSIS — E2839 Other primary ovarian failure: Secondary | ICD-10-CM

## 2020-04-04 NOTE — Progress Notes (Addendum)
Date:  04/06/2020   ID:  Courtney Olson, DOB [birth year], MRN [id number]   PCP:  Judge Stall, FNP (Inactive)  Cardiologist:  Fransico Him, MD  Electrophysiologist:  None   Chief Complaint:  OSA, PAF, syncope, HLD, CAD  History of Present Illness:     Courtney Olson a 54 y.o.femalewith history ofHTN, DM, paroxysmal atrial fibrillation onXarelto,vasovagal syncope, hyperlipidemia, OSA, obesity, bradycardia, CAD. She underwent home sleep study showing moderate OSA with an AHI of 16.8/hr and nocturnal hypoxemia with 11 min of O2 sats < 88% during sleep.    She is here today for followup today with a myriad of complaints.  She is very anxious and tearful and is worried that she may have damaged her heart last fall. Apparently shortly after her PCI she lifted a heavy object and then remembered that she was not supposed to do any lifting. Shortly after that she felt a pain in her chest and was worried that she damaged her heart.  She did not have any more pain but has been worried since then.     She tells me that she has not felt well for the past 3 months.  She says that she has been waking up in the middle of the night with palpitations like she had with her afib in the past.  She also feels very out of shape.  She says that when she walks up a hill she feels very fatigued and dragging.  Last week she was walking in her house and felt lightheaded.  Her Bp during the dizzy episode was 105/42mmHg. She also has had 2 episodes of pain in her back similar to what she had had before her PCI but this episode was when she was sitting but she says that it was at rest as well prior to PCI.  She denies any exertional chest pain or pressure,  PND, orthopnea, LE edema or syncope. She has had problems with DOE since she had COVID last year.  She is compliant with her meds and is tolerating meds with no SE.    She is doing well with her CPAP device and thinks that she has gotten used to it.  She tolerates  the mask and feels the pressure is adequate.  Since going on CPAP she feels rested in the am and has no significant daytime sleepiness.  She denies any significant mouth or nasal dryness or nasal congestion.  She does not think that he snores.    The patient does not have symptoms concerning for COVID-19 infection (fever, chills, cough, or new shortness of breath).   Prior CV studies:   The following studies were reviewed today: ,  outpt labs, PAP compliance download, EKG, cardiac cath  Past Medical History:  Diagnosis Date  . Aortic calcification (HCC)   . Atrophic vaginitis 01/2006  . Bradycardia 05/2014  . CAD (coronary artery disease)    cardiac cath 05/21/2019 showing 44 mRCA, 60% D1 and D2 that were very small and patent LAD and LCx.  She underwent DES to the [facility] with residual 25% stenosis in the dRCA  . Chronic pancolonic ulcerative colitis ([place]) 2015   unknown if chronic.  . CKD (chronic kidney disease), stage II   . Cytomegaloviral hepatitis ([place]) 2005  . Diabetes mellitus without complication ([place]) 02/1016   on Ace inhibitor to protect kidneys  . Gallstones 01/2013  . GERD (gastroesophageal reflux disease)   . Hyperlipidemia   . Hypertension   . Kidney tumor  03/2013   Right  . Obesity   . OSA (obstructive sleep apnea)   . PAF (paroxysmal atrial fibrillation) ([place])   . Pancreatitis 2014  . Postmenopausal   . RA (rheumatoid arthritis) ([place])   . Syncope and collapse   . Ulcerative colitis ([place]) 2015   Past Surgical History:  Procedure Laterality Date  . CESAREAN SECTION    . CHOLECYSTECTOMY  01/2013  . COLONOSCOPY  2015   Colitis  . CORONARY STENT INTERVENTION N/A 05/21/2019   Procedure: CORONARY STENT INTERVENTION;  Surgeon: Jettie Booze, MD;  Location: [place] [facility];  Service: Cardiovascular;  Laterality: N/A;  . DILATION AND CURETTAGE OF UTERUS  2002  . HERNIA REPAIR  01/2013  . Kidney and Bladder Stent and tube placed Right 03/2013  . PARTIAL  NEPHRECTOMY Right 02/2013   Tumor Removal   . Procedure to remove kidney and bladder stent and tube Right 04/2013  . RIGHT/LEFT HEART CATH AND CORONARY ANGIOGRAPHY N/A 05/21/2019   Procedure: RIGHT/LEFT HEART CATH AND CORONARY ANGIOGRAPHY;  Surgeon: Jettie Booze, MD;  Location: [place] [facility];  Service: Cardiovascular;  Laterality: N/A;  . TOTAL ABDOMINAL HYSTERECTOMY W/ BILATERAL SALPINGOOPHORECTOMY  2002   Secondary to AUB     Current Meds  Medication Sig  . ALPRAZolam (XANAX) 0.25 MG tablet Take 1 tablet by mouth 2 (two) times daily as needed.  Marland Kitchen atorvastatin (LIPITOR) 40 MG tablet TAKE 1 TABLET(40 MG) BY MOUTH DAILY  . Cholecalciferol (VITAMIN D3 PO) Take 1 capsule by mouth as directed.  . clopidogrel (PLAVIX) 75 MG tablet Take 1 tablet (75 mg total) by mouth daily with breakfast.  . EPINEPHrine 0.3 mg/0.3 mL IJ SOAJ injection Inject 0.3 mLs into the skin as needed.  . furosemide (LASIX) 20 MG tablet Take 20 mg by mouth daily as needed.   Marland Kitchen glipiZIDE (GLUCOTROL XL) 10 MG 24 hr tablet Take 10 mg by mouth daily.  . Insulin Glargine (BASAGLAR KWIKPEN) 100 UNIT/ML SOPN Inject 14 Units into the skin 2 (two) times daily.   . mesalamine (LIALDA) 1.2 G EC tablet Take 1.2 g by mouth daily.   . metoprolol succinate (TOPROL-XL) 25 MG 24 hr tablet Take 0.5 tablets (12.5 mg total) by mouth daily.  . nitroGLYCERIN (NITROSTAT) 0.4 MG SL tablet Place 1 tablet (0.4 mg total) under the tongue every 5 (five) minutes as needed.  . pantoprazole (PROTONIX) 40 MG tablet TAKE 1 TABLET(40 MG) BY MOUTH DAILY  . ramipril (ALTACE) 5 MG capsule Take 1 capsule (5 mg total) by mouth daily.  . rivaroxaban (XARELTO) 20 MG TABS tablet Take 1 tablet (20 mg total) by mouth daily.  . TRULICITY 1.5 EX/5.2WU SOPN Inject 1.5 mg into the skin every 7 (seven) days.   . [DISCONTINUED] atorvastatin (LIPITOR) 40 MG tablet TAKE 1 TABLET(40 MG) BY MOUTH DAILY  . [DISCONTINUED] clopidogrel (PLAVIX) 75 MG tablet Take 1  tablet (75 mg total) by mouth daily with breakfast.  . [DISCONTINUED] metoprolol succinate (TOPROL-XL) 25 MG 24 hr tablet Take 0.5 tablets (12.5 mg total) by mouth daily.  . [DISCONTINUED] ramipril (ALTACE) 5 MG capsule Take 5 mg by mouth daily.  . [DISCONTINUED] rivaroxaban (XARELTO) 20 MG TABS tablet Take 1 tablet (20 mg total) by mouth daily.     Allergies:   Orange fruit [citrus], Other, Bactrim [sulfamethoxazole-trimethoprim], and Tape   Social History   Tobacco Use  . Smoking status: Never Smoker  . Smokeless tobacco: Never Used  Vaping Use  .  Vaping Use: Never used  Substance Use Topics  . Alcohol use: Yes    Comment: 2 a month  . Drug use: No     Family Hx: The patient's family history includes Cancer in her maternal grandmother and paternal aunt; Diabetes in her father, maternal grandmother, mother, and paternal aunt; Fibromyalgia in her mother; Heart disease in her father; Hypertension in her father, mother, and sister. There is no history of Breast cancer.  ROS:   Please see the history of present illness.     All other systems reviewed and are negative.   Labs/Other Tests and Data Reviewed:    Recent Labs: 04/05/2020: ALT 26; BUN 23; Creatinine, Ser 1.05; Hemoglobin 13.1; NT-Pro BNP 237; Platelets 190; Potassium 4.0; Sodium 140   Recent Lipid Panel Lab Results  Component Value Date/Time   CHOL 190 04/05/2020 10:16 AM   TRIG 155 (H) 04/05/2020 10:16 AM   HDL 84 04/05/2020 10:16 AM   CHOLHDL 2.3 04/05/2020 10:16 AM   CHOLHDL 4 05/17/2014 11:13 AM   LDLCALC 80 04/05/2020 10:16 AM    Wt Readings from Last 3 Encounters:  04/05/20 213 lb 3.2 oz (96.7 kg)  11/17/19 209 lb 6.4 oz (95 kg)  10/15/19 202 lb (91.6 kg)     Objective:    Vital Signs:  BP (!) 160/102   Pulse 74   Ht $R'5\' 5"'fN$  (1.651 m)   Wt 213 lb 3.2 oz (96.7 kg)   LMP 07/16/2000 (Approximate)   BMI 35.48 kg/m    GEN: Well nourished, well developed in no acute distress HEENT: Normal NECK: No  JVD; No carotid bruits LYMPHATICS: No lymphadenopathy CARDIAC:RRR, no murmurs, rubs, gallops RESPIRATORY:  Clear to auscultation without rales, wheezing or rhonchi  ABDOMEN: Soft, non-tender, non-distended MUSCULOSKELETAL:  No edema; No deformity  SKIN: Warm and dry NEUROLOGIC:  Alert and oriented x 3 PSYCHIATRIC:  Normal affect    ASSESSMENT & PLAN:    1. OSA -  The patient is tolerating PAP therapy well without any problems. The PAP download was reviewed today and showed an AHI of 0.3/hr on auto CPAP  with 50% compliance in using more than 4 hours nightly.  The patient has been using and benefiting from PAP use and will continue to benefit from therapy.  -I encouraged her to be more compliant with her device>>her compliance had dropped down due to a head cold  2.  HTN -BP is poorly controlled today but she is very upset and anxious. -Her BP at home for the most part has been controlled around 120/42mmHg -continue Toprol XL 12.$RemoveBefo'5mg'TUoFCbWJOpC$  daily and Altace $RemoveBe'5mg'kqMhTsKGm$  daily -SCr 0.95 in Feb 2021  3.  PAF -she is maintaining NSR on exam but complains of intermittent palpitations -I will get a 2 week ziopatch to rule out arrhythmias -continue Toprol and Xarelto $RemoveBef'20mg'VldHGIQjGm$  daily -check BMET and CBC  4.  PAD -LE ABIs showed increased BPs in the LEs but normal AIBs -duplex showed no significant obstructive disease affecting her legs  5.  ASCAD -coronary CTA showed a markedly elevated Coronary Ca score of 2924 which is 99th% ag and sex matched controls -coronary CTA showed <25% non-calcified plaque in the LM, 50-69% prox and mid LAD and moderate disease in the diagonals.  LCx has mild CAD 25-49% as well as OM1 and occluded mid RCA with L>R collaterals from dLAD to RPDA and 25-49% prox and dRCA. -Cardiac cath 05/2019 showed 80% mRCA, 25% dRCA and pRCA, 60% D1 and D2 s/p PCI  of the mid RCA. -she is now having atypical back pain but no exertional symptoms -she is very concerned that she may have damaged her  heart last year from lifting right after her procedure and then not completing cardiac rehab -I tried to reassure her that I think that her heart is fine -I will get a Lexiscan myoview to assess for ischemia -I will see if insurance will still pay for cardiac rehab if she starts now -continue Plavix 75mg  daily, BB and statin -no ASA due to DOAC -she also wants a BNP done with her labs today because she is very concerned that it had been elevated at one time.  She does not appear volume overloaded on exam but is adamant about checking it  6.  HLD -LDL goal < 70 -check FLP and ALT -continue Atorvastatin 40mg  daily  7.  Anxiety -she appears VERY anxious today and tearful and very worried about her health -she does not like her PCP and needs to get another one which I encouraged her to do at once because I cannot treat her noncardiac health problems -she wanted  HbA1C checked which I agreed to do but told her that her PCP would need to followup on the results -I encouraged her to get a new PCP to address her anxiety as well and would likely benefit from therapy as well as possible anxiolytic meds   Medication Adjustments/Labs and Tests Ordered: Current medicines are reviewed at length with the patient today.  Concerns regarding medicines are outlined above.  Tests Ordered: Orders Placed This Encounter  Procedures  . Comprehensive metabolic panel  . CBC  . Pro b natriuretic peptide (BNP)  . Lipid panel  . Hemoglobin A1c  . Amb Ref to Medical Weight Management  . MYOCARDIAL PERFUSION IMAGING  . EKG 12-Lead   Medication Changes: Meds ordered this encounter  Medications  . atorvastatin (LIPITOR) 40 MG tablet    Sig: TAKE 1 TABLET(40 MG) BY MOUTH DAILY    Dispense:  90 tablet    Refill:  3  . clopidogrel (PLAVIX) 75 MG tablet    Sig: Take 1 tablet (75 mg total) by mouth daily with breakfast.    Dispense:  90 tablet    Refill:  3  . metoprolol succinate (TOPROL-XL) 25 MG 24 hr  tablet    Sig: Take 0.5 tablets (12.5 mg total) by mouth daily.    Dispense:  45 tablet    Refill:  3  . ramipril (ALTACE) 5 MG capsule    Sig: Take 1 capsule (5 mg total) by mouth daily.    Dispense:  90 capsule    Refill:  3  . rivaroxaban (XARELTO) 20 MG TABS tablet    Sig: Take 1 tablet (20 mg total) by mouth daily.    Dispense:  30 tablet    Refill:  11    Disposition:  Follow up in 1 year  Signed, Fransico Him, MD  04/06/2020 11:23 AM    [place]

## 2020-04-05 ENCOUNTER — Encounter: Payer: Self-pay | Admitting: Cardiology

## 2020-04-05 ENCOUNTER — Other Ambulatory Visit: Payer: Self-pay

## 2020-04-05 ENCOUNTER — Ambulatory Visit (INDEPENDENT_AMBULATORY_CARE_PROVIDER_SITE_OTHER): Payer: 59 | Admitting: Cardiology

## 2020-04-05 VITALS — BP 160/102 | HR 74 | Ht 65.0 in | Wt 213.2 lb

## 2020-04-05 DIAGNOSIS — F419 Anxiety disorder, unspecified: Secondary | ICD-10-CM

## 2020-04-05 DIAGNOSIS — I2510 Atherosclerotic heart disease of native coronary artery without angina pectoris: Secondary | ICD-10-CM

## 2020-04-05 DIAGNOSIS — G4733 Obstructive sleep apnea (adult) (pediatric): Secondary | ICD-10-CM

## 2020-04-05 DIAGNOSIS — I10 Essential (primary) hypertension: Secondary | ICD-10-CM | POA: Diagnosis not present

## 2020-04-05 DIAGNOSIS — I739 Peripheral vascular disease, unspecified: Secondary | ICD-10-CM | POA: Diagnosis not present

## 2020-04-05 DIAGNOSIS — I480 Paroxysmal atrial fibrillation: Secondary | ICD-10-CM

## 2020-04-05 DIAGNOSIS — E7800 Pure hypercholesterolemia, unspecified: Secondary | ICD-10-CM

## 2020-04-05 MED ORDER — RIVAROXABAN 20 MG PO TABS: 20.0000 mg | ORAL_TABLET | Freq: Every day | ORAL | 11 refills | Status: DC

## 2020-04-05 MED ORDER — RAMIPRIL 5 MG PO CAPS: 5.0000 mg | ORAL_CAPSULE | Freq: Every day | ORAL | 3 refills | Status: DC

## 2020-04-05 MED ORDER — METOPROLOL SUCCINATE ER 25 MG PO TB24: 12.5000 mg | ORAL_TABLET | Freq: Every day | ORAL | 3 refills | Status: DC

## 2020-04-05 MED ORDER — CLOPIDOGREL BISULFATE 75 MG PO TABS: 75.0000 mg | ORAL_TABLET | Freq: Every day | ORAL | 3 refills | Status: DC

## 2020-04-05 MED ORDER — ATORVASTATIN CALCIUM 40 MG PO TABS: ORAL_TABLET | ORAL | 3 refills | Status: DC

## 2020-04-05 NOTE — Patient Instructions (Signed)
Medication Instructions:  Your physician recommends that you continue on your current medications as directed. Please refer to the Current Medication list given to you today.  *If you need a refill on your cardiac medications before your next appointment, please call your pharmacy*   Lab Work: TODAY: CBC, CMET, FLP, HbA1C, BNP If you have labs (blood work) drawn today and your tests are completely normal, you will receive your results only by: Marland Kitchen MyChart Message (if you have MyChart) OR . A paper copy in the mail If you have any lab test that is abnormal or we need to change your treatment, we will call you to review the results.   Testing/Procedures: Your physician has requested that you have a lexiscan myoview. For further information please visit [email or web address]. Please follow instruction sheet, as given.  Follow-Up: At [facility], you and your health needs are our priority.  As part of our continuing mission to provide you with exceptional heart care, we have created designated Provider Care Teams.  These Care Teams include your primary Cardiologist (physician) and Advanced Practice Providers (APPs -  Physician Assistants and Nurse Practitioners) who all work together to provide you with the care you need, when you need it.  Your next appointment:   1 year(s)  The format for your next appointment:   In Person  Provider:   You may see Fransico Him, MD or one of the following Advanced Practice Providers on your designated Care Team:    Melina Copa, PA-C  Ermalinda Barrios, PA-C

## 2020-04-06 ENCOUNTER — Telehealth: Payer: Self-pay

## 2020-04-06 ENCOUNTER — Telehealth (HOSPITAL_COMMUNITY): Payer: Self-pay | Admitting: *Deleted

## 2020-04-06 DIAGNOSIS — E7800 Pure hypercholesterolemia, unspecified: Secondary | ICD-10-CM

## 2020-04-06 DIAGNOSIS — F419 Anxiety disorder, unspecified: Secondary | ICD-10-CM

## 2020-04-06 DIAGNOSIS — I480 Paroxysmal atrial fibrillation: Secondary | ICD-10-CM

## 2020-04-06 LAB — COMPREHENSIVE METABOLIC PANEL
ALT: 26 IU/L (ref 0–32)
AST: 26 IU/L (ref 0–40)
Albumin/Globulin Ratio: 1.6 (ref 1.2–2.2)
Albumin: 4.6 g/dL (ref 3.8–4.9)
Alkaline Phosphatase: 90 IU/L (ref 44–121)
BUN/Creatinine Ratio: 22 (ref 9–23)
BUN: 23 mg/dL (ref 6–24)
Bilirubin Total: 0.7 mg/dL (ref 0.0–1.2)
CO2: 27 mmol/L (ref 20–29)
Calcium: 9.6 mg/dL (ref 8.7–10.2)
Chloride: 97 mmol/L (ref 96–106)
Creatinine, Ser: 1.05 mg/dL — ABNORMAL HIGH (ref 0.57–1.00)
GFR calc Af Amer: 70 mL/min/{1.73_m2} (ref >59)
GFR calc non Af Amer: 60 mL/min/{1.73_m2} (ref >59)
Globulin, Total: 2.8 g/dL (ref 1.5–4.5)
Glucose: 124 mg/dL — ABNORMAL HIGH (ref 65–99)
Potassium: 4 mmol/L (ref 3.5–5.2)
Sodium: 140 mmol/L (ref 134–144)
Total Protein: 7.4 g/dL (ref 6.0–8.5)

## 2020-04-06 LAB — CBC
Hematocrit: 39.6 % (ref 34.0–46.6)
Hemoglobin: 13.1 g/dL (ref 11.1–15.9)
MCH: 28.7 pg (ref 26.6–33.0)
MCHC: 33.1 g/dL (ref 31.5–35.7)
MCV: 87 fL (ref 79–97)
Platelets: 190 10*3/uL (ref 150–450)
RBC: 4.56 x10E6/uL (ref 3.77–5.28)
RDW: 14.5 % (ref 11.7–15.4)
WBC: 7.6 10*3/uL (ref 3.4–10.8)

## 2020-04-06 LAB — LIPID PANEL
Chol/HDL Ratio: 2.3 ratio (ref 0.0–4.4)
Cholesterol, Total: 190 mg/dL (ref 100–199)
HDL: 84 mg/dL (ref >39)
LDL Chol Calc (NIH): 80 mg/dL (ref 0–99)
Triglycerides: 155 mg/dL — ABNORMAL HIGH (ref 0–149)
VLDL Cholesterol Cal: 26 mg/dL (ref 5–40)

## 2020-04-06 LAB — HEMOGLOBIN A1C
Est. average glucose Bld gHb Est-mCnc: 209 mg/dL
Hgb A1c MFr Bld: 8.9 % — ABNORMAL HIGH (ref 4.8–5.6)

## 2020-04-06 LAB — PRO B NATRIURETIC PEPTIDE: NT-Pro BNP: 237 pg/mL (ref 0–249)

## 2020-04-06 MED ORDER — ATORVASTATIN CALCIUM 80 MG PO TABS: 80.0000 mg | ORAL_TABLET | Freq: Every day | ORAL | 3 refills | Status: DC

## 2020-04-06 NOTE — Telephone Encounter (Signed)
The patient has been notified of the result and verbalized understanding.  All questions (if any) were answered. Antonieta Iba, RN 04/06/2020 2:04 PM  Patient will increase lipitor to 80 mg daily.  Scheduled for repeat lab work.

## 2020-04-06 NOTE — Telephone Encounter (Signed)
-----   Message from Sueanne Margarita, MD sent at 04/05/2020  8:30 PM EDT ----- Labs normal except for LDL which is too high at 80, goal < 70.  Increase Lipitor to 80mg  daily and repeat FLP and ALT in 6 weeks

## 2020-04-06 NOTE — Telephone Encounter (Signed)
Patient given detailed instructions per Myocardial Perfusion Study Information Sheet for the test on 04/07/20 at 7:30. Patient notified to arrive 15 minutes early and that it is imperative to arrive on time for appointment to keep from having the test rescheduled.  If you need to cancel or reschedule your appointment, please call the office within 24 hours of your appointment. . Patient verbalized understanding.Veronia Beets

## 2020-04-07 ENCOUNTER — Other Ambulatory Visit: Payer: Self-pay

## 2020-04-07 ENCOUNTER — Ambulatory Visit (HOSPITAL_COMMUNITY): Payer: 59 | Attending: Internal Medicine

## 2020-04-07 DIAGNOSIS — I2510 Atherosclerotic heart disease of native coronary artery without angina pectoris: Secondary | ICD-10-CM | POA: Diagnosis not present

## 2020-04-07 LAB — MYOCARDIAL PERFUSION IMAGING
LV dias vol: 59 mL (ref 46–106)
LV sys vol: 15 mL
Peak HR: 100 {beats}/min
Rest HR: 75 {beats}/min
SDS: 1
SRS: 0
SSS: 1
TID: 0.91

## 2020-04-07 MED ORDER — TECHNETIUM TC 99M TETROFOSMIN IV KIT
31.9000 | PACK | Freq: Once | INTRAVENOUS | Status: AC | PRN
  Administered 2020-04-07: 31.9 via INTRAVENOUS
  Filled 2020-04-07: qty 32

## 2020-04-07 MED ORDER — REGADENOSON 0.4 MG/5ML IV SOLN
0.4000 mg | Freq: Once | INTRAVENOUS | Status: AC
  Administered 2020-04-07: 0.4 mg via INTRAVENOUS

## 2020-04-07 MED ORDER — TECHNETIUM TC 99M TETROFOSMIN IV KIT
10.3000 | PACK | Freq: Once | INTRAVENOUS | Status: AC | PRN
  Administered 2020-04-07: 10.3 via INTRAVENOUS
  Filled 2020-04-07: qty 11

## 2020-04-08 ENCOUNTER — Telehealth: Payer: Self-pay | Admitting: Cardiology

## 2020-04-08 NOTE — Telephone Encounter (Signed)
I spoke with patient. She is going to [place] tomorrow and is then leaving for a 6 day cruise.  Everyone will be covid tested and fully vaccinated. Patient is asking if OK to go on this trip.  She has reviewed Lexiscan results in my chart.  Is asking if hyperdynamic EF is indicative of CHF.  Recent BNP was normal.  I explained to her stress test was not best test to measure EF and we would let her know if other tests indicated once reviewed.   I told her test had not been reviewed by Dr Radford Pax but it appears to be a low risk study and as long as she was feeling OK it should be OK to go on her trip. Patient mentioned she should have talked with Dr Radford Pax about prescribing something for her anxiety.  I told patient Dr Radford Pax did not prescribe anxiety medications.

## 2020-04-08 NOTE — Telephone Encounter (Signed)
New message:     Patient calling to see if it is ok to go on a trip. Please call patient.

## 2020-04-10 NOTE — Telephone Encounter (Signed)
Nuclear stress tet is normal with no ischemia.  EF is normal and pumping function is fine.  No evidence of systolic CHF and BNP normal so no diastolic CHF.  Needs to see PCP for anxiety treatment

## 2020-04-11 NOTE — Telephone Encounter (Signed)
Spoke with the patient and advised her on information from Dr. Radford Pax. Patient is very relieved. She will be going on her trip as planned. She will follow up with her PCP in regards to her anxiety.

## 2020-04-17 ENCOUNTER — Ambulatory Visit (INDEPENDENT_AMBULATORY_CARE_PROVIDER_SITE_OTHER): Payer: 59

## 2020-04-17 DIAGNOSIS — I480 Paroxysmal atrial fibrillation: Secondary | ICD-10-CM | POA: Diagnosis not present

## 2020-04-19 DIAGNOSIS — M65311 Trigger thumb, right thumb: Secondary | ICD-10-CM | POA: Insufficient documentation

## 2020-04-22 ENCOUNTER — Telehealth: Payer: Self-pay | Admitting: Cardiology

## 2020-04-22 NOTE — Telephone Encounter (Signed)
Patient states that she got a notification that lab work has been scheduled for her. She is wondering why she needs this lab work. No notes regarding scheduling for her labs/ Will route to [place], Therapist, sports for advisement.

## 2020-04-22 NOTE — Telephone Encounter (Signed)
Pt is calling to see what kind of lap work she needs to have done. Please call back

## 2020-05-02 NOTE — Telephone Encounter (Signed)
Left message for patient advising her that we are repeating FLP and ALT after increasing Lipitor to 80 mg daily. Advised to call back if she has any further questions or needs to reschedule lab work.

## 2020-05-05 DIAGNOSIS — H25812 Combined forms of age-related cataract, left eye: Secondary | ICD-10-CM | POA: Insufficient documentation

## 2020-05-12 ENCOUNTER — Telehealth: Payer: Self-pay

## 2020-05-12 MED ORDER — METOPROLOL SUCCINATE ER 25 MG PO TB24: 25.0000 mg | ORAL_TABLET | Freq: Every day | ORAL | 3 refills | Status: DC

## 2020-05-12 NOTE — Telephone Encounter (Signed)
-----   Message from Sueanne Margarita, MD sent at 05/12/2020  2:46 PM EDT ----- Heart monitor showed extra heart beats from the top and bottom of her heart which are benign. Please increase Toprol XL 25mg  daily and followup with PA in 2 weeks

## 2020-05-12 NOTE — Telephone Encounter (Signed)
The patient has been notified of the result and verbalized understanding.  All questions (if any) were answered. Antonieta Iba, RN 05/12/2020 3:47 PM  Rx has been sent in. FU has been scheduled.

## 2020-05-17 ENCOUNTER — Other Ambulatory Visit: Payer: Self-pay | Admitting: Cardiology

## 2020-05-21 ENCOUNTER — Other Ambulatory Visit: Payer: Self-pay | Admitting: Cardiology

## 2020-05-21 DIAGNOSIS — I480 Paroxysmal atrial fibrillation: Secondary | ICD-10-CM

## 2020-05-27 ENCOUNTER — Other Ambulatory Visit: Payer: 59

## 2020-06-05 NOTE — Progress Notes (Deleted)
Cardiology Office Note   Date:  06/05/2020   ID:  Tammala Weider, DOB 03-17-1966, MRN [id number]  PCP:  Courtney Stall, FNP (Inactive)  Cardiologist:  Dr. Fransico Him, MD   No chief complaint on file.     History of Present Illness: Courtney Olson is a 54 y.o. female who presents for follow-up, seen for Courtney Olson.  Courtney Olson has a history of hypertension, DM, paroxysmal atrial fibrillation on Xarelto, vasovagal syncope, hyperlipidemia, OSA, obesity, bradycardia and CAD.  She has a remote history of chest pain with nuc 07/2012 was normal.  Echocardiogram 03/2014 showed normal EF, G1 DD. ETT 04/2014 was normal. She has had pancreatitis and was diagnosed with a kidney mass and underwent a large resection of a benign mass complicated by multiple infections. In 01/2014 she had an episode of syncope following severe abdominal pain, felt vasovagal in etiology. Acute abdominal pain related to her ulcerative colitis felt to be initiator. Cardiac monitor 05/2016 showed NSR (sinus brady-sinus tach 51-127bpm), average HR 73bpm, isoalted atrial couplet.   She was seen by Courtney Olson in 04/2019 with DOE. Echo on 05/06/19 showed EF 60-65%, impaired diastolic relaxation, no acute abnormalities. BNP 05/07/19 atwas mildly elevated at 250so HCTZ was stopped and she was started on Lasix. Coronary CTA 05/13/19 was abnormal with concern for CTO of the RCA and moderate prox-mid LAD disease. Courtney Olson recommended cardiac catheterization.   LHC showed mRCA lesion of 80% treated with PCI/DES x1. Did have residual disease in 1st/2nd diag of 60% but noted to be small vessels. pRCA and dRCA with 25% lesion. Plan for medical therapy for residual disease. Plan for Plavix as well as Xarelto for at least 6 months, no ASA.   She was most recently seen in follow-up with Courtney Olson 04/05/2020 with multiple complaints.  She reported not feeling well over the last 3 months increasing palpitations, fatigued and would have  occasional lightheadedness.  BPs during her dizzy episodes were 105/56.  She had pains in her back that were similar to what she had prior to PCI placement.  BP was felt to be poorly controlled however she was upset and anxious.  BPs at home reported in the 120 range.  She was maintaining NSR although had intermittent palpitations therefore Zio patch was placed.  She was continued on Toprol and Xarelto.  Given her cardiac symptoms, plan was for [facility].  Courtney Olson was going to investigate if insurance would pay for cardiac rehab.   Stress test performed 04/07/2020 which showed a normal stress nuclear study with no ischemia or infarct with an LVEF at 75% and normal wall motion.  ZIO monitor showed sinus bradycardia, normal sinus rhythm and sinus tachycardia with occasional PVCs therefore Toprol was increased.     1. OSA -  The patient is tolerating PAP therapy well without any problems. The PAP download was reviewed today and showed an AHI of 0.3/hr on auto CPAP  with 50% compliance in using more than 4 hours nightly.  The patient has been using and benefiting from PAP use and will continue to benefit from therapy.  -I encouraged her to be more compliant with her device>>her compliance had dropped down due to a head cold  2.  HTN -BP is poorly controlled today but she is very upset and anxious. -Her BP at home for the most part has been controlled around 120/64mmHg -continue Toprol XL 12.5mg  daily and Altace 5mg  daily -SCr 0.95 in Feb 2021  3.  PAF -she is maintaining NSR on exam but complains of intermittent palpitations -I will get a 2 week ziopatch to rule out arrhythmias -continue Toprol and Xarelto 20mg  daily -check BMET and CBC  4.  PAD -LE ABIs showed increased BPs in the LEs but normal AIBs -duplex showed no significant obstructive disease affecting her legs  5.  ASCAD -coronary CTA showed a markedly elevated Coronary Ca score of 2924 which is 99th% ag and sex matched  controls -coronary CTA showed <25% non-calcified plaque in the LM, 50-69% prox and mid LAD and moderate disease in the diagonals.  LCx has mild CAD 25-49% as well as OM1 and occluded mid RCA with L>R collaterals from dLAD to RPDA and 25-49% prox and dRCA. -Cardiac cath 05/2019 showed 80% mRCA, 25% dRCA and pRCA, 60% D1 and D2 s/p PCI of the mid RCA. -she is now having atypical back pain but no exertional symptoms -she is very concerned that she may have damaged her heart last year from lifting right after her procedure and then not completing cardiac rehab -I tried to reassure her that I think that her heart is fine -I will get a Lexiscan myoview to assess for ischemia -I will see if insurance will still pay for cardiac rehab if she starts now -continue Plavix 75mg  daily, BB and statin -no ASA due to DOAC -she also wants a BNP done with her labs today because she is very concerned that it had been elevated at one time.  She does not appear volume overloaded on exam but is adamant about checking it  6.  HLD -LDL goal < 70 -check FLP and ALT -continue Atorvastatin 40mg  daily  7.  Anxiety -she appears VERY anxious today and tearful and very worried about her health -she does not like her PCP and needs to get another one which I encouraged her to do at once because I cannot treat her noncardiac health problems -she wanted  HbA1C checked which I agreed to do but told her that her PCP would need to followup on the results -I encouraged her to get a new PCP to address her anxiety as well and would likely benefit from therapy as well as possible anxiolytic meds     Past Medical History:  Diagnosis Date  . Aortic calcification (HCC)   . Atrophic vaginitis 01/2006  . Bradycardia 05/2014  . CAD (coronary artery disease)    cardiac cath 05/21/2019 showing 73 mRCA, 60% D1 and D2 that were very small and patent LAD and LCx.  She underwent DES to the [facility] with residual 25% stenosis in the dRCA  .  Chronic pancolonic ulcerative colitis ([place]) 2015   unknown if chronic.  . CKD (chronic kidney disease), stage II   . Cytomegaloviral hepatitis ([place]) 2005  . Diabetes mellitus without complication ([place]) 07/6107   on Ace inhibitor to protect kidneys  . Gallstones 01/2013  . GERD (gastroesophageal reflux disease)   . Hyperlipidemia   . Hypertension   . Kidney tumor 03/2013   Right  . Obesity   . OSA (obstructive sleep apnea)   . PAF (paroxysmal atrial fibrillation) ([place])   . Pancreatitis 2014  . Postmenopausal   . RA (rheumatoid arthritis) ([place])   . Syncope and collapse   . Ulcerative colitis ([place]) 2015    Past Surgical History:  Procedure Laterality Date  . CESAREAN SECTION    . CHOLECYSTECTOMY  01/2013  . COLONOSCOPY  2015   Colitis  . CORONARY  STENT INTERVENTION N/A 05/21/2019   Procedure: CORONARY STENT INTERVENTION;  Surgeon: Jettie Booze, MD;  Location: [place] [facility];  Service: Cardiovascular;  Laterality: N/A;  . DILATION AND CURETTAGE OF UTERUS  2002  . HERNIA REPAIR  01/2013  . Kidney and Bladder Stent and tube placed Right 03/2013  . PARTIAL NEPHRECTOMY Right 02/2013   Tumor Removal   . Procedure to remove kidney and bladder stent and tube Right 04/2013  . RIGHT/LEFT HEART CATH AND CORONARY ANGIOGRAPHY N/A 05/21/2019   Procedure: RIGHT/LEFT HEART CATH AND CORONARY ANGIOGRAPHY;  Surgeon: Jettie Booze, MD;  Location: [place] [facility];  Service: Cardiovascular;  Laterality: N/A;  . TOTAL ABDOMINAL HYSTERECTOMY W/ BILATERAL SALPINGOOPHORECTOMY  2002   Secondary to AUB     Current Outpatient Medications  Medication Sig Dispense Refill  . ALPRAZolam (XANAX) 0.25 MG tablet Take 1 tablet by mouth 2 (two) times daily as needed.  0  . atorvastatin (LIPITOR) 80 MG tablet Take 1 tablet (80 mg total) by mouth daily. 90 tablet 3  . Cholecalciferol (VITAMIN D3 PO) Take 1 capsule by mouth as directed.    . clopidogrel (PLAVIX) 75 MG tablet Take 1 tablet (75 mg  total) by mouth daily with breakfast. 90 tablet 3  . EPINEPHrine 0.3 mg/0.3 mL IJ SOAJ injection Inject 0.3 mLs into the skin as needed.  2  . furosemide (LASIX) 20 MG tablet TAKE 1 TABLET(20 MG) BY MOUTH DAILY 90 tablet 2  . glipiZIDE (GLUCOTROL XL) 10 MG 24 hr tablet Take 10 mg by mouth daily.  2  . Insulin Glargine (BASAGLAR KWIKPEN) 100 UNIT/ML SOPN Inject 14 Units into the skin 2 (two) times daily.   5  . mesalamine (LIALDA) 1.2 G EC tablet Take 1.2 g by mouth daily.     . metoprolol succinate (TOPROL-XL) 25 MG 24 hr tablet Take 1 tablet (25 mg total) by mouth daily. 90 tablet 3  . nitroGLYCERIN (NITROSTAT) 0.4 MG SL tablet Place 1 tablet (0.4 mg total) under the tongue every 5 (five) minutes as needed. 25 tablet 2  . pantoprazole (PROTONIX) 40 MG tablet TAKE 1 TABLET(40 MG) BY MOUTH DAILY 30 tablet 3  . ramipril (ALTACE) 5 MG capsule Take 1 capsule (5 mg total) by mouth daily. 90 capsule 3  . rivaroxaban (XARELTO) 20 MG TABS tablet Take 1 tablet (20 mg total) by mouth daily. 30 tablet 11  . TRULICITY 1.5 XI/3.3AS SOPN Inject 1.5 mg into the skin every 7 (seven) days.      No current facility-administered medications for this visit.    Allergies:   Orange fruit [citrus], Other, Bactrim [sulfamethoxazole-trimethoprim], and Tape    Social History:  The patient  reports that she has never smoked. She has never used smokeless tobacco. She reports current alcohol use. She reports that she does not use drugs.   Family History:  The patient's family history includes Cancer in her maternal grandmother and paternal aunt; Diabetes in her father, maternal grandmother, mother, and paternal aunt; Fibromyalgia in her mother; Heart disease in her father; Hypertension in her father, mother, and sister.    ROS:  Please see the history of present illness.   Otherwise, review of systems are positive for none.  All other systems are reviewed and negative.    PHYSICAL EXAM: VS:  LMP 07/16/2000  (Approximate)  , BMI There is no height or weight on file to calculate BMI.   General: Well developed, well nourished, NAD Skin: Warm,  dry, intact  Head: Normocephalic, atraumatic, sclera non-icteric, no xanthomas, clear, moist mucus membranes. Neck: Negative for carotid bruits. No JVD Lungs:Clear to ausculation bilaterally. No wheezes, rales, or rhonchi. Breathing is unlabored. Cardiovascular: RRR with S1 S2. No murmurs, rubs, gallops, or LV heave appreciated. Abdomen: Soft, non-tender, non-distended with normoactive bowel sounds. No hepatomegaly, No rebound/guarding. No obvious abdominal masses. MSK: Strength and tone appear normal for age. 5/5 in all extremities Extremities: No edema. No clubbing or cyanosis. DP/PT pulses 2+ bilaterally Neuro: Alert and oriented. No focal deficits. No facial asymmetry. MAE spontaneously. Psych: Responds to questions appropriately with normal affect.     EKG:  EKG {ACTION; IS/IS WRU:04540981} ordered today. The ekg ordered today demonstrates ***   Recent Labs: 04/05/2020: ALT 26; BUN 23; Creatinine, Ser 1.05; Hemoglobin 13.1; NT-Pro BNP 237; Platelets 190; Potassium 4.0; Sodium 140    Lipid Panel    Component Value Date/Time   CHOL 190 04/05/2020 1016   TRIG 155 (H) 04/05/2020 1016   HDL 84 04/05/2020 1016   CHOLHDL 2.3 04/05/2020 1016   CHOLHDL 4 05/17/2014 1113   VLDL 34.4 05/17/2014 1113   LDLCALC 80 04/05/2020 1016      Wt Readings from Last 3 Encounters:  04/07/20 213 lb (96.6 kg)  04/05/20 213 lb 3.2 oz (96.7 kg)  11/17/19 209 lb 6.4 oz (95 kg)      Other studies Reviewed: Additional studies/ records that were reviewed today include: ***. Review of the above records demonstrates: ***  ZIO monitor 05/11/2020:   Sinus bradycardia, normal sinus rhythm and sinus tachycardia. The average heart rate was 71bpm and ranged from 46 to 121bpm.  Occasional PVCs, bigeminal PVCs, ventricular couplets and nonsustained ventricular  tachycardia up to 6 beats. PVC load < 1%.  Occasional PACs and nonsustained atrial tachycardia.   Lexiscan stress test 04/07/2020:   Nuclear stress EF: 75%.  There was no ST segment deviation noted during stress.  The study is normal.  This is a low risk study.  The left ventricular ejection fraction is hyperdynamic (>65%).   Normal stress nuclear study with no ischemia or infarction; gated EF 75; normal wall motion.   LHC 05/21/2019:   Prox RCA lesion is 25% stenosed.  Mid RCA lesion is 80% stenosed.  Dist RCA lesion is 25% stenosed.  The left ventricular systolic function is normal.  LV end diastolic pressure is normal.  The left ventricular ejection fraction is 55-65% by visual estimate.  There is no aortic valve stenosis.  1st Diag and 2nd Diag, 60% stenosed. Very small vesssels.  Ao sat 99%, PA sat 76%, mean PA pressure 14 mm Hg; mean PCWP 4 mm Hg.  A drug-eluting stent was successfully placed using a STENT SYNERGY DES 2.75X20.  Post intervention, there is a 0% residual stenosis.   Generally calcified RCA with moderate disease proximally and distally.  If PCI was needed, would have to consider more supportive guide and guide extension to deliver stents more distally.   Continue aggressive secondary prevention.     ASSESSMENT AND PLAN:  1.  ***   Current medicines are reviewed at length with the patient today.  The patient {ACTIONS; HAS/DOES NOT HAVE:19233} concerns regarding medicines.  The following changes have been made:  {PLAN; NO CHANGE:13088:s}  Labs/ tests ordered today include: *** No orders of the defined types were placed in this encounter.    Disposition:   FU with *** in {gen number 1-91:478295} {Days to years:10300}  Signed, Kathyrn Drown, NP  06/05/2020 7:58 AM    [address], [place], [place]  [address] Phone: [phone]; Fax: [phone]

## 2020-06-07 ENCOUNTER — Other Ambulatory Visit: Payer: 59

## 2020-06-07 ENCOUNTER — Ambulatory Visit: Payer: 59 | Admitting: Cardiology

## 2020-06-13 ENCOUNTER — Other Ambulatory Visit: Payer: Self-pay | Admitting: Cardiology

## 2020-06-13 ENCOUNTER — Telehealth: Payer: Self-pay | Admitting: Cardiology

## 2020-06-13 NOTE — Telephone Encounter (Signed)
I am fine with that 

## 2020-06-13 NOTE — Telephone Encounter (Signed)
Patient would like to switch from Dr. Radford Pax to Dr. Irish Lack.

## 2020-06-14 ENCOUNTER — Other Ambulatory Visit: Payer: 59 | Admitting: *Deleted

## 2020-06-14 ENCOUNTER — Other Ambulatory Visit: Payer: Self-pay

## 2020-06-14 DIAGNOSIS — E7800 Pure hypercholesterolemia, unspecified: Secondary | ICD-10-CM

## 2020-06-14 LAB — LIPID PANEL
Chol/HDL Ratio: 2.5 ratio (ref 0.0–4.4)
Cholesterol, Total: 172 mg/dL (ref 100–199)
HDL: 70 mg/dL (ref >39)
LDL Chol Calc (NIH): 77 mg/dL (ref 0–99)
Triglycerides: 150 mg/dL — ABNORMAL HIGH (ref 0–149)
VLDL Cholesterol Cal: 25 mg/dL (ref 5–40)

## 2020-06-14 LAB — ALT: ALT: 27 IU/L (ref 0–32)

## 2020-06-14 MED ORDER — FUROSEMIDE 20 MG PO TABS: ORAL_TABLET | ORAL | 2 refills | Status: DC

## 2020-06-14 NOTE — Telephone Encounter (Signed)
Ok

## 2020-06-14 NOTE — Progress Notes (Deleted)
{Choose 1 Note Type (Video or Telephone):[phone]}    Date:  06/14/2020   ID:  Courtney Olson, DOB May 07, 1966, MRN [id number] The patient was identified using 2 identifiers.  {Patient Location:[id number]::"Home"} {Provider Location:[id number]::"Home Office"}  PCP:  Judge Stall, FNP (Inactive)  Cardiologist:  Fransico Him, MD *** Electrophysiologist:  None   Evaluation Performed:  {Choose Visit [phone]::"Follow-Up Visit"}  Chief Complaint:  ***  History of Present Illness:    Courtney Olson is a 54 y.o. female with ith history ofHTN, DM, paroxysmal atrial fibrillation onXarelto,vasovagal syncope, hyperlipidemia, OSA, obesity, bradycardia, CAD.She underwent home sleep study showing moderate OSA with an AHI of 16.8/hr and nocturnal hypoxemia with 11 min of O2 sats < 88% during sleep.       Courtney Olson has a history of hypertension, DM, paroxysmal atrial fibrillation on Xarelto, vasovagal syncope, hyperlipidemia, OSA, obesity, bradycardia and CAD.  She has a remote history of chest pain with nuc 07/2012 was normal.  Echocardiogram 03/2014 showed normal EF, G1 DD. ETT 04/2014 was normal. She has had pancreatitis and was diagnosed with a kidney mass and underwent a large resection of a benign mass complicated by multiple infections. In 01/2014 she had an episode of syncope following severe abdominal pain, felt vasovagal in etiology. Acute abdominal pain related to her ulcerative colitis felt to be initiator. Cardiac monitor 05/2016 showed NSR (sinus brady-sinus tach 51-127bpm), average HR 73bpm, isoalted atrial couplet.   She was seen by Dr. Radford Pax in 04/2019 with DOE. Echo on 05/06/19 showed EF 60-65%, impaired diastolic relaxation, no acute abnormalities. BNP 05/07/19 atwas mildly elevated at 250so HCTZ was stopped and she was started on Lasix. Coronary CTA 05/13/19 was abnormal with concern for CTO of the RCA and moderate prox-mid LAD disease. Dr. Radford Pax recommended cardiac  catheterization.   LHC showedmRCA lesion of 80% treated with PCI/DES x1.Did have residual disease in 1st/2nd diag of 60% but noted to be small vessels. pRCA and dRCA with 25% lesion. Plan for medical therapy for residual disease.Plan for Plavix as well as Xareltofor at least 6 months, no ASA.   She was most recently seen in follow-up with Dr. Radford Pax 04/05/2020 with multiple complaints.  She reported not feeling well over the last 3 months increasing palpitations, fatigued and would have occasional lightheadedness.  BPs during her dizzy episodes were 105/56.  She had pains in her back that were similar to what she had prior to PCI placement.  BP was felt to be poorly controlled however she was upset and anxious.  BPs at home reported in the 120 range.  She was maintaining NSR although had intermittent palpitations therefore Zio patch was placed.  She was continued on Toprol and Xarelto.  Given her cardiac symptoms, plan was for [facility].  Dr. Radford Pax was going to investigate if insurance would pay for cardiac rehab.   Stress test performed 04/07/2020 which showed a normal stress nuclear study with no ischemia or infarct with an LVEF at 75% and normal wall motion.  ZIO monitor showed sinus bradycardia, normal sinus rhythm and sinus tachycardia with occasional PVCs therefore Toprol was increased.     1.OSA - The patient is tolerating PAP therapy well without any problems. The PAP download was reviewed today and showed an AHI of 0.3/hr onauto CPAPwith 50% compliance in using more than 4 hours nightly. The patient has been using and benefiting from PAP use and will continue to benefit from therapy.  -I encouraged her to be more compliant with her device>>her  compliance had dropped down due to a head cold  2. HTN -BPis poorly controlled today but she is very upset and anxious. -Her BP at home for the most part has been controlled around 120/18mmHg -continue Toprol XL 12.5mg   daily and Altace5mg  daily -SCr 0.95 in Feb 2021  3. PAF -sheis maintaining NSR on exam but complains of intermittent palpitations -I will get a 2 week ziopatch to rule out arrhythmias -continue Toprol and Xarelto 20mg  daily -check BMET and CBC  4.PAD -LE ABIsshowed increased BPs in the LEs but normal AIBs -duplex showed no significant obstructive disease affecting her legs  5. ASCAD -coronary CTA showed a markedly elevated Coronary Ca score of 2924 which is 99th% ag and sex matched controls -coronary CTA showed <25% non-calcified plaque in the LM, 50-69% prox and mid LAD and moderate disease in the diagonals. LCx has mild CAD 25-49% as well as OM1 and occluded mid RCA with L>R collaterals from dLAD to RPDA and 25-49% prox and dRCA. -Cardiac cath 05/2019 showed 80% mRCA, 25% dRCA and pRCA, 60% D1 and D2 s/p PCI of the mid RCA. -sheis now having atypical back pain but no exertional symptoms -she is very concerned that she may have damaged her heart last year from lifting right after her procedure and then not completing cardiac rehab -I tried to reassure her that I think that her heart is fine -I will get a Lexiscan myoview to assess for ischemia -I will see if insurance will still pay for cardiac rehab if she starts now -continue Plavix 75mg  daily, BB and statin -no ASA due to DOAC -she also wants a BNP done with her labs today because she is very concerned that it had been elevated at one time. She does not appear volume overloaded on exam but is adamant about checking it  6. HLD -LDL goal < 70 -check FLP and ALT -continue Atorvastatin 40mg  daily  7. Anxiety -she appears VERY anxious today and tearful and very worried about her health -she does not like her PCP and needs to get another one which I encouraged her to do at once because I cannot treat her noncardiac health problems -she wanted HbA1C checked which I agreed to do but told her that her PCP would need to  followup on the results -I encouraged her to get a new PCP to address her anxiety as well and would likely benefit from therapy as well as possible anxiolytic meds              She was last seen by Dr. Radford Pax 04/05/20 with a myriad of complaints. She was felt to be very anxious regarding her heart. Apparently shortly after her PCI she lifted a heavy object and then remembered that she was not supposed to do any lifting. Shortly after that she felt a pain in her chest and was worried that she damaged her heart.  She did not have any more pain but has been worried since then.     She tells me that she has not felt well for the past 3 months.  She says that she has been waking up in the middle of the night with palpitations like she had with her afib in the past.  She also feels very out of shape.  She says that when she walks up a hill she feels very fatigued and dragging.  Last week she was walking in her house and felt lightheaded.  Her Bp during the dizzy episode was  105/68mmHg. She also has had 2 episodes of pain in her back similar to what she had had before her PCI but this episode was when she was sitting but she says that it was at rest as well prior to PCI.  She denies any exertional chest pain or pressure,  PND, orthopnea, LE edema or syncope. She has had problems with DOE since she had COVID last year.  She is compliant with her meds and is tolerating meds with no SE.    She is doing well with her CPAP device and thinks that she has gotten used to it.  She tolerates the mask and feels the pressure is adequate.  Since going on CPAP she feels rested in the am and has no significant daytime sleepiness.  She denies any significant mouth or nasal dryness or nasal congestion.  She does not think that he snores.    Plan at that time was to aim for better compliance with her CPAP. In regards to her palpitations, she wore a ZIO monitor that showed    1. OSA -  The patient is tolerating  PAP therapy well without any problems. The PAP download was reviewed today and showed an AHI of 0.3/hr on auto CPAP  with 50% compliance in using more than 4 hours nightly.  The patient has been using and benefiting from PAP use and will continue to benefit from therapy.  -I encouraged her to be more compliant with her device>>her compliance had dropped down due to a head cold  2.  HTN -BP is poorly controlled today but she is very upset and anxious. -Her BP at home for the most part has been controlled around 120/69mmHg -continue Toprol XL 12.5mg  daily and Altace 5mg  daily -SCr 0.95 in Feb 2021  3.  PAF -she is maintaining NSR on exam but complains of intermittent palpitations -I will get a 2 week ziopatch to rule out arrhythmias -continue Toprol and Xarelto 20mg  daily -check BMET and CBC  4.  PAD -LE ABIs showed increased BPs in the LEs but normal AIBs -duplex showed no significant obstructive disease affecting her legs  5.  ASCAD -coronary CTA showed a markedly elevated Coronary Ca score of 2924 which is 99th% ag and sex matched controls -coronary CTA showed <25% non-calcified plaque in the LM, 50-69% prox and mid LAD and moderate disease in the diagonals.  LCx has mild CAD 25-49% as well as OM1 and occluded mid RCA with L>R collaterals from dLAD to RPDA and 25-49% prox and dRCA. -Cardiac cath 05/2019 showed 80% mRCA, 25% dRCA and pRCA, 60% D1 and D2 s/p PCI of the mid RCA. -she is now having atypical back pain but no exertional symptoms -she is very concerned that she may have damaged her heart last year from lifting right after her procedure and then not completing cardiac rehab -I tried to reassure her that I think that her heart is fine -I will get a Lexiscan myoview to assess for ischemia -I will see if insurance will still pay for cardiac rehab if she starts now -continue Plavix 75mg  daily, BB and statin -no ASA due to DOAC -she also wants a BNP done with her labs today  because she is very concerned that it had been elevated at one time.  She does not appear volume overloaded on exam but is adamant about checking it  6.  HLD -LDL goal < 70 -check FLP and ALT -continue Atorvastatin 40mg  daily  7.  Anxiety -she  appears VERY anxious today and tearful and very worried about her health -she does not like her PCP and needs to get another one which I encouraged her to do at once because I cannot treat her noncardiac health problems -she wanted  HbA1C checked which I agreed to do but told her that her PCP would need to followup on the results -I encouraged her to get a new PCP to address her anxiety as well and would likely benefit from therapy as well as possible anxiolytic meds            The patient {does/does not:200015} have symptoms concerning for COVID-19 infection (fever, chills, cough, or new shortness of breath).    Past Medical History:  Diagnosis Date  . Aortic calcification (HCC)   . Atrophic vaginitis 01/2006  . Bradycardia 05/2014  . CAD (coronary artery disease)    cardiac cath 05/21/2019 showing 76 mRCA, 60% D1 and D2 that were very small and patent LAD and LCx.  She underwent DES to the [facility] with residual 25% stenosis in the dRCA  . Chronic pancolonic ulcerative colitis ([place]) 2015   unknown if chronic.  . CKD (chronic kidney disease), stage II   . Cytomegaloviral hepatitis ([place]) 2005  . Diabetes mellitus without complication ([place]) 0/2774   on Ace inhibitor to protect kidneys  . Gallstones 01/2013  . GERD (gastroesophageal reflux disease)   . Hyperlipidemia   . Hypertension   . Kidney tumor 03/2013   Right  . Obesity   . OSA (obstructive sleep apnea)   . PAF (paroxysmal atrial fibrillation) ([place])   . Pancreatitis 2014  . Postmenopausal   . RA (rheumatoid arthritis) ([place])   . Syncope and collapse   . Ulcerative colitis ([place]) 2015   Past Surgical History:  Procedure Laterality Date  . CESAREAN SECTION    .  CHOLECYSTECTOMY  01/2013  . COLONOSCOPY  2015   Colitis  . CORONARY STENT INTERVENTION N/A 05/21/2019   Procedure: CORONARY STENT INTERVENTION;  Surgeon: Jettie Booze, MD;  Location: [facility];  Service: Cardiovascular;  Laterality: N/A;  . DILATION AND CURETTAGE OF UTERUS  2002  . HERNIA REPAIR  01/2013  . Kidney and Bladder Stent and tube placed Right 03/2013  . PARTIAL NEPHRECTOMY Right 02/2013   Tumor Removal   . Procedure to remove kidney and bladder stent and tube Right 04/2013  . RIGHT/LEFT HEART CATH AND CORONARY ANGIOGRAPHY N/A 05/21/2019   Procedure: RIGHT/LEFT HEART CATH AND CORONARY ANGIOGRAPHY;  Surgeon: Jettie Booze, MD;  Location: [facility];  Service: Cardiovascular;  Laterality: N/A;  . TOTAL ABDOMINAL HYSTERECTOMY W/ BILATERAL SALPINGOOPHORECTOMY  2002   Secondary to AUB     No outpatient medications have been marked as taking for the 06/20/20 encounter (Appointment) with Tommie Raymond, NP.     Allergies:   Orange fruit [citrus], Other, Bactrim [sulfamethoxazole-trimethoprim], and Tape   Social History   Tobacco Use  . Smoking status: Never Smoker  . Smokeless tobacco: Never Used  Vaping Use  . Vaping Use: Never used  Substance Use Topics  . Alcohol use: Yes    Comment: 2 a month  . Drug use: No     Family Hx: The patient's family history includes Cancer in her maternal grandmother and paternal aunt; Diabetes in her father, maternal grandmother, mother, and paternal aunt; Fibromyalgia in her mother; Heart disease in her father; Hypertension in her father, mother, and sister. There is no history of  Breast cancer.  ROS:   Please see the history of present illness.    *** All other systems reviewed and are negative.   Prior CV studies:   The following studies were reviewed today:   ZIO monitor 05/11/2020:   Sinus bradycardia, normal sinus rhythm and sinus tachycardia. The average heart rate was 71bpm and ranged from 46  to 121bpm.  Occasional PVCs, bigeminal PVCs, ventricular couplets and nonsustained ventricular tachycardia up to 6 beats. PVC load < 1%.  Occasional PACs and nonsustained atrial tachycardia.  Lexiscan stress test 04/07/2020:   Nuclear stress EF: 75%.  There was no ST segment deviation noted during stress.  The study is normal.  This is a low risk study.  The left ventricular ejection fraction is hyperdynamic (>65%).  Normal stress nuclear study with no ischemia or infarction; gated EF 75; normal wall motion.   LHC 05/21/2019:   Prox RCA lesion is 25% stenosed.  Mid RCA lesion is 80% stenosed.  Dist RCA lesion is 25% stenosed.  The left ventricular systolic function is normal.  LV end diastolic pressure is normal.  The left ventricular ejection fraction is 55-65% by visual estimate.  There is no aortic valve stenosis.  1st Diag and 2nd Diag, 60% stenosed. Very small vesssels.  Ao sat 99%, PA sat 76%, mean PA pressure 14 mm Hg; mean PCWP 4 mm Hg.  A drug-eluting stent was successfully placed using a STENT SYNERGY DES 2.75X20.  Post intervention, there is a 0% residual stenosis.  Generally calcified RCA with moderate disease proximally and distally. If PCI was needed, would have to consider more supportive guide and guide extension to deliver stents more distally.   Continue aggressive secondary prevention.   Labs/Other Tests and Data Reviewed:    EKG:  {EKG/Telemetry Strips Reviewed:[phone]}  Recent Labs: 04/05/2020: ALT 26; BUN 23; Creatinine, Ser 1.05; Hemoglobin 13.1; NT-Pro BNP 237; Platelets 190; Potassium 4.0; Sodium 140   Recent Lipid Panel Lab Results  Component Value Date/Time   CHOL 190 04/05/2020 10:16 AM   TRIG 155 (H) 04/05/2020 10:16 AM   HDL 84 04/05/2020 10:16 AM   CHOLHDL 2.3 04/05/2020 10:16 AM   CHOLHDL 4 05/17/2014 11:13 AM   LDLCALC 80 04/05/2020 10:16 AM    Wt Readings from Last 3 Encounters:  04/07/20 213 lb (96.6 kg)   04/05/20 213 lb 3.2 oz (96.7 kg)  11/17/19 209 lb 6.4 oz (95 kg)     Risk Assessment/Calculations:   {Does this patient have ATRIAL FIBRILLATION?[phone]}  Objective:    Vital Signs:  LMP 07/16/2000 (Approximate)    {[facility] Virtual Exam (Optional):[phone]::"VITAL SIGNS:  reviewed"}  ASSESSMENT & PLAN:    1. ***   Shared Decision Making/Informed Consent   {Are you ordering a CV Procedure (e.g. stress test, cath, DCCV, TEE, etc)?   Press F2        :[id number]}    COVID-19 Education: The signs and symptoms of COVID-19 were discussed with the patient and how to seek care for testing (follow up with PCP or arrange E-visit).  ***The importance of social distancing was discussed today.  Time:   Today, I have spent *** minutes with the patient with telehealth technology discussing the above problems.     Medication Adjustments/Labs and Tests Ordered: Current medicines are reviewed at length with the patient today.  Concerns regarding medicines are outlined above.   Tests Ordered: No orders of the defined types were placed in this encounter.   Medication Changes: No orders  of the defined types were placed in this encounter.   Follow Up:  {F/U [phone]} {follow up:15908}  Signed, Kathyrn Drown, NP  06/14/2020 10:22 AM    [facility]

## 2020-06-15 ENCOUNTER — Telehealth: Payer: Self-pay

## 2020-06-15 DIAGNOSIS — E7800 Pure hypercholesterolemia, unspecified: Secondary | ICD-10-CM

## 2020-06-15 MED ORDER — EZETIMIBE 10 MG PO TABS: 10.0000 mg | ORAL_TABLET | Freq: Every day | ORAL | 3 refills | Status: DC

## 2020-06-15 NOTE — Telephone Encounter (Signed)
The patient has been notified of the result and verbalized understanding.  All questions (if any) were answered. Antonieta Iba, RN 06/15/2020 2:25 PM  Patient reports that she has been taking her atorvastatin. She will start on zetia 10 mg daily and repeat lab work 02/01.  Patient also states that she would like to get back into cardiac rehab. She started virtual rehab after her stent in 2020 but it was not helpful. She states that she has been declining and does not have the strength that she used to. I will send a note over to cardiac rehab to see if patient can get back in. I also advised the patient to discuss this will Kathyrn Drown, NP at her appointment on Monday.

## 2020-06-15 NOTE — Telephone Encounter (Signed)
-----   Message from Sueanne Margarita, MD sent at 06/14/2020  4:56 PM EST ----- LDL not at goal - please find out if patient has missed any doses of her atorvastatin and if no then start Zetia 10mg  daily and repeat FLP and ALT in 6 weeks

## 2020-06-17 ENCOUNTER — Telehealth: Payer: Self-pay | Admitting: Cardiology

## 2020-06-17 NOTE — Telephone Encounter (Signed)
Spoke with the patient and new orders have been placed for cardiac rehab since it has been over a year since she began it. Patient is aware that someone will be in touch with her to set it up if approved.

## 2020-06-17 NOTE — Telephone Encounter (Signed)
   Pt is following up cardiac rehab. She said around December last year Dr. Radford Pax ordered cardiac rehab for her but due to covid they closed, she wanted to do the cardiac rehab but she wanted to make sure Dr. Radford Pax still have the order for her and if her insurance authorized it

## 2020-06-20 ENCOUNTER — Telehealth: Payer: 59 | Admitting: Cardiology

## 2020-06-20 ENCOUNTER — Other Ambulatory Visit: Payer: Self-pay

## 2020-06-20 ENCOUNTER — Telehealth (INDEPENDENT_AMBULATORY_CARE_PROVIDER_SITE_OTHER): Payer: 59 | Admitting: Cardiology

## 2020-06-20 ENCOUNTER — Encounter: Payer: Self-pay | Admitting: Cardiology

## 2020-06-20 VITALS — BP 170/81 | HR 59 | Ht 65.0 in | Wt 206.0 lb

## 2020-06-20 DIAGNOSIS — I739 Peripheral vascular disease, unspecified: Secondary | ICD-10-CM

## 2020-06-20 DIAGNOSIS — I10 Essential (primary) hypertension: Secondary | ICD-10-CM | POA: Diagnosis not present

## 2020-06-20 DIAGNOSIS — I2510 Atherosclerotic heart disease of native coronary artery without angina pectoris: Secondary | ICD-10-CM

## 2020-06-20 DIAGNOSIS — I480 Paroxysmal atrial fibrillation: Secondary | ICD-10-CM | POA: Diagnosis not present

## 2020-06-20 DIAGNOSIS — F419 Anxiety disorder, unspecified: Secondary | ICD-10-CM | POA: Diagnosis not present

## 2020-06-20 DIAGNOSIS — G4733 Obstructive sleep apnea (adult) (pediatric): Secondary | ICD-10-CM

## 2020-06-20 MED ORDER — GLIPIZIDE ER 10 MG PO TB24
10.0000 mg | ORAL_TABLET | Freq: Every day | ORAL | 2 refills | Status: DC
Start: 2020-06-20 — End: 2020-10-26

## 2020-06-20 NOTE — Patient Instructions (Signed)
Medication Instructions:  Your physician recommends that you continue on your current medications as directed. Please refer to the Current Medication list given to you today.  *If you need a refill on your cardiac medications before your next appointment, please call your pharmacy*   Lab Work: NONE If you have labs (blood work) drawn today and your tests are completely normal, you will receive your results only by: Marland Kitchen MyChart Message (if you have MyChart) OR . A paper copy in the mail If you have any lab test that is abnormal or we need to change your treatment, we will call you to review the results.   Testing/Procedures: NONE   Follow-Up: At [facility], you and your health needs are our priority.  As part of our continuing mission to provide you with exceptional heart care, we have created designated Provider Care Teams.  These Care Teams include your primary Cardiologist (physician) and Advanced Practice Providers (APPs -  Physician Assistants and Nurse Practitioners) who all work together to provide you with the care you need, when you need it.  We recommend signing up for the patient portal called "MyChart".  Sign up information is provided on this After Visit Summary.  MyChart is used to connect with patients for Virtual Visits (Telemedicine).  Patients are able to view lab/test results, encounter notes, upcoming appointments, etc.  Non-urgent messages can be sent to your provider as well.   To learn more about what you can do with MyChart, go to [email or web address].    Your next appointment:   6 month(s)  The format for your next appointment:   In Person  Provider:   You may see Fransico Him, MD or one of the following Advanced Practice Providers on your designated Care Team:    Melina Copa, PA-C  Ermalinda Barrios, PA-C

## 2020-06-20 NOTE — Progress Notes (Signed)
Virtual Visit via Telephone Note   This visit type was conducted due to national recommendations for restrictions regarding the COVID-19 Pandemic (e.g. social distancing) in an effort to limit this patient's exposure and mitigate transmission in our community.  Due to her co-morbid illnesses, this patient is at least at moderate risk for complications without adequate follow up.  This format is felt to be most appropriate for this patient at this time.  The patient did not have access to video technology/had technical difficulties with video requiring transitioning to audio format only (telephone).  All issues noted in this document were discussed and addressed.  No physical exam could be performed with this format.  Please refer to the patient's chart for her  consent to telehealth for [facility].    Date:  06/20/2020   ID:  Courtney Olson, DOB 1966/01/10, MRN [id number] The patient was identified using 2 identifiers.  Patient Location: Home Provider Location: Home Office  PCP:  Judge Stall, [place] (Inactive)  Cardiologist:  Fransico Him, MD    Evaluation Performed:  Follow-Up Visit  Chief Complaint:  Follow up testing   History of Present Illness:    Courtney Olson is a 54 y.o. female with a history ofHTN, DM, paroxysmal atrial fibrillation onXarelto,vasovagal syncope, hyperlipidemia, OSA, obesity, bradycardia, and CAD.  She has a remote history of chest painwith prior stress testing in 07/2012 that was found to be  normal.Echocardiogram9/2015 showed normal EF,G1 DD. ETT in 04/2014 was normal. She has had pancreatitis and was diagnosed with a kidney mass and underwent a large resection of a benign mass complicated by multiple infections. In7/2015she had an episode of syncope following severe abdominal pain, felt to be vasovagal in etiology.Acute abdominal pain related to her ulcerative colitisfelt to be initiator. Cardiac monitor in  05/2016 showed NSR (sinus  brady-sinus tach 51-127bpm), average HR 73bpm, isoalted atrial couplet.  She was seen by Dr. Radford Pax in 04/2019 with DOE. Echo on 05/06/19 showed EF 60-65%, impaired diastolic relaxation, no acute abnormalities. BNP 05/07/19 atwas mildly elevated at 250therefore HCTZ was stopped and she was started on Lasix. Coronary CTA 05/13/19 was abnormal with concern for CTO of the RCA and moderate prox-mid LAD disease. Dr. Radford Pax recommended cardiac catheterization.LHC then showedmRCA lesion of 80% treated with PCI/DES x1.She does have known residual disease in 1st/2nd diag of 60% but noted to be small vessels. pRCA and dRCA with 25% stenosis. Plan was for medical therapy for residual disease with Plavix only given the need for Xareltofor PAF.   She wasmostrecently seen in follow-up with Dr. Radford Pax 04/05/2020 withmultiplecomplaints. She reported not feeling well over the last 3 months with increasing palpitations,fatigue and would have occasional lightheadedness. She had pains in her back that were similar to what she had prior to PCI placement. She was maintaining NSR although had intermittent palpitations therefore Zio patch was placed. She was continued on Toprol and Xarelto. Given her cardiac symptoms, plan was for [facility]. Dr. Radford Pax was going to investigate if insurance would pay for cardiac rehab.  Stress test performed 04/07/2020 which showed a normal stress nuclear study with no ischemia or infarct with an LVEF at 75% and normal wall motion. ZIO monitor showed sinus bradycardia, normal sinus rhythm and sinus tachycardia with occasional PVCs therefore Toprol was increased.  Today she is seen via telehealth visit for follow up of recent testing. She states that her biggest concern more recently has been her exercise intolerance. She states that she previously was able  to walk many miles per day without difficulty and felt better after stent placement but now is fearful to start an exercise  routine as she would like to be monitored for the first few times given her hx. Due to Covid, she was never able to participate on Cardiac rehab and would greatly benefit from this to alleviate some of her anxiety regarding her CV health. She also mentions the lower extremity pain that she has been experiencing. Appears that doppler were performed that were essentially normal. She now feels this is sciatic pain. She denies chest pain, palpitations, LE edema, orthopnea, dizziness, or syncope.    The patient does not have symptoms concerning for COVID-19 infection (fever, chills, cough, or new shortness of breath).    Past Medical History:  Diagnosis Date  . Aortic calcification (HCC)   . Atrophic vaginitis 01/2006  . Bradycardia 05/2014  . CAD (coronary artery disease)    cardiac cath 05/21/2019 showing 17 mRCA, 60% D1 and D2 that were very small and patent LAD and LCx.  She underwent DES to the [facility] with residual 25% stenosis in the dRCA  . Chronic pancolonic ulcerative colitis ([place]) 2015   unknown if chronic.  . CKD (chronic kidney disease), stage II   . Cytomegaloviral hepatitis ([place]) 2005  . Diabetes mellitus without complication ([place]) 09/7626   on Ace inhibitor to protect kidneys  . Gallstones 01/2013  . GERD (gastroesophageal reflux disease)   . Hyperlipidemia   . Hypertension   . Kidney tumor 03/2013   Right  . Obesity   . OSA (obstructive sleep apnea)   . PAF (paroxysmal atrial fibrillation) ([place])   . Pancreatitis 2014  . Postmenopausal   . RA (rheumatoid arthritis) ([place])   . Syncope and collapse   . Ulcerative colitis ([place]) 2015   Past Surgical History:  Procedure Laterality Date  . CESAREAN SECTION    . CHOLECYSTECTOMY  01/2013  . COLONOSCOPY  2015   Colitis  . CORONARY STENT INTERVENTION N/A 05/21/2019   Procedure: CORONARY STENT INTERVENTION;  Surgeon: Jettie Booze, MD;  Location: [place] [facility];  Service: Cardiovascular;  Laterality: N/A;  . DILATION AND  CURETTAGE OF UTERUS  2002  . HERNIA REPAIR  01/2013  . Kidney and Bladder Stent and tube placed Right 03/2013  . PARTIAL NEPHRECTOMY Right 02/2013   Tumor Removal   . Procedure to remove kidney and bladder stent and tube Right 04/2013  . RIGHT/LEFT HEART CATH AND CORONARY ANGIOGRAPHY N/A 05/21/2019   Procedure: RIGHT/LEFT HEART CATH AND CORONARY ANGIOGRAPHY;  Surgeon: Jettie Booze, MD;  Location: [place] [facility];  Service: Cardiovascular;  Laterality: N/A;  . TOTAL ABDOMINAL HYSTERECTOMY W/ BILATERAL SALPINGOOPHORECTOMY  2002   Secondary to AUB     Current Meds  Medication Sig  . ALPRAZolam (XANAX) 0.25 MG tablet Take 1 tablet by mouth 2 (two) times daily as needed.  Marland Kitchen atorvastatin (LIPITOR) 80 MG tablet Take 1 tablet (80 mg total) by mouth daily.  . Cholecalciferol (VITAMIN D3 PO) Take 1 capsule by mouth as directed.  . clopidogrel (PLAVIX) 75 MG tablet Take 1 tablet (75 mg total) by mouth daily with breakfast.  . EPINEPHrine 0.3 mg/0.3 mL IJ SOAJ injection Inject 0.3 mLs into the skin as needed.  . ezetimibe (ZETIA) 10 MG tablet Take 1 tablet (10 mg total) by mouth daily.  . furosemide (LASIX) 20 MG tablet Take 20 mg by mouth as needed for fluid or edema.  Marland Kitchen glipiZIDE (  GLUCOTROL XL) 10 MG 24 hr tablet Take 1 tablet (10 mg total) by mouth daily.  . Insulin Glargine (BASAGLAR KWIKPEN) 100 UNIT/ML SOPN Inject 14 Units into the skin 2 (two) times daily.   . mesalamine (LIALDA) 1.2 G EC tablet Take 1.2 g by mouth daily.   . metoprolol succinate (TOPROL-XL) 25 MG 24 hr tablet Take 1 tablet (25 mg total) by mouth daily.  . nitroGLYCERIN (NITROSTAT) 0.4 MG SL tablet Place 1 tablet (0.4 mg total) under the tongue every 5 (five) minutes as needed.  . pantoprazole (PROTONIX) 40 MG tablet TAKE 1 TABLET BY MOUTH  DAILY  . ramipril (ALTACE) 5 MG capsule Take 1 capsule (5 mg total) by mouth daily.  . rivaroxaban (XARELTO) 20 MG TABS tablet Take 1 tablet (20 mg total) by mouth daily.  .  TRULICITY 1.5 SW/5.4OE SOPN Inject 1.5 mg into the skin every 7 (seven) days.   . [DISCONTINUED] glipiZIDE (GLUCOTROL XL) 10 MG 24 hr tablet Take 10 mg by mouth daily.     Allergies:   Orange fruit [citrus], Other, Bactrim [sulfamethoxazole-trimethoprim], and Tape   Social History   Tobacco Use  . Smoking status: Never Smoker  . Smokeless tobacco: Never Used  Vaping Use  . Vaping Use: Never used  Substance Use Topics  . Alcohol use: Yes    Comment: 2 a month  . Drug use: No     Family Hx: The patient's family history includes Cancer in her maternal grandmother and paternal aunt; Diabetes in her father, maternal grandmother, mother, and paternal aunt; Fibromyalgia in her mother; Heart disease in her father; Hypertension in her father, mother, and sister. There is no history of Breast cancer.  ROS:   Please see the history of present illness.     All other systems reviewed and are negative.  Prior CV studies:   The following studies were reviewed today:  Zio 05/11/20:   Sinus bradycardia, normal sinus rhythm and sinus tachycardia. The average heart rate was 71bpm and ranged from 46 to 121bpm.  Occasional PVCs, bigeminal PVCs, ventricular couplets and nonsustained ventricular tachycardia up to 6 beats. PVC load < 1%.  Occasional PACs and nonsustained atrial tachycardia.  Lexiscan stress test 04/07/20:   Nuclear stress EF: 75%.  There was no ST segment deviation noted during stress.  The study is normal.  This is a low risk study.  The left ventricular ejection fraction is hyperdynamic (>65%).   Normal stress nuclear study with no ischemia or infarction; gated EF 75; normal wall motion.   Labs/Other Tests and Data Reviewed:    EKG:  No ECG reviewed.  Recent Labs: 04/05/2020: BUN 23; Creatinine, Ser 1.05; Hemoglobin 13.1; NT-Pro BNP 237; Platelets 190; Potassium 4.0; Sodium 140 06/14/2020: ALT 27   Recent Lipid Panel Lab Results  Component Value Date/Time    CHOL 172 06/14/2020 12:30 PM   TRIG 150 (H) 06/14/2020 12:30 PM   HDL 70 06/14/2020 12:30 PM   CHOLHDL 2.5 06/14/2020 12:30 PM   CHOLHDL 4 05/17/2014 11:13 AM   LDLCALC 77 06/14/2020 12:30 PM    Wt Readings from Last 3 Encounters:  06/20/20 206 lb (93.4 kg)  04/07/20 213 lb (96.6 kg)  04/05/20 213 lb 3.2 oz (96.7 kg)     Risk Assessment/Calculations:     CHA2DS2-VASc Score = 5  This indicates a 7.2% annual risk of stroke. The patient's score is based upon:  Objective:    Vital Signs:  BP (!) 170/81  Pulse (!) 59   Ht 5\' 5"  (1.651 m)   Wt 206 lb (93.4 kg)   LMP 07/16/2000 (Approximate)   BMI 34.28 kg/m    VITAL SIGNS:  reviewed GEN:  no acute distress NEURO:  alert and oriented x 3, no obvious focal deficit PSYCH:  normal affect  ASSESSMENT & PLAN:    1.OSA: -Followed by Dr. Irene Limbo increased compliance with CPAP  2. HTN: -Elevated today however has better control at baseline  -Continue Toprol XL 25mg  daily and Ramipril5mg  daily -Creatinine, 1.05 on 04/05/20   3. PAF: -Recent Zio with no AF however PACs, PVCs therefore Toprol was up titrated to 25mg  PO QD -Denies palpitations  -Continue Xarelto 20mg  daily for a CHA2DS2VASc score of 5  4.PAD -LE ABIsshowed increased BPs in the LEs but normal AIBs -Duplex showed no significant obstructive disease affecting her legs -Reports she thinks pain is related to sciatic nerve pain>>needs to follow with PCP   5. CAD: -LHC 05/2019 with 80% mRCA, 25% dRCA and pRCA, 60% D1 and D2 s/p PCI of the mid RCA. -No recurrent anginal symptoms -Reassuring Lexiscan stress test with no ischemia or infarct  -Needs to participate in cardiac rehab since they were not accepting patients during covid peaks.   6. HLD -LDL at 77 with goal < 70 -Continue Atorvastatin 40mg  daily  7. Anxiety: -Continues to be very anxious regarding her health>>currenlty in the process of finding a new PCP  Shared Decision  Making/Informed Consent      COVID-19 Education: The signs and symptoms of COVID-19 were discussed with the patient and how to seek care for testing (follow up with PCP or arrange E-visit).  The importance of social distancing was discussed today.  Time:   Today, I have spent 20 minutes with the patient with telehealth technology discussing the above problems.    Medication Adjustments/Labs and Tests Ordered: Current medicines are reviewed at length with the patient today.  Concerns regarding medicines are outlined above.   Tests Ordered: No orders of the defined types were placed in this encounter.   Medication Changes: Meds ordered this encounter  Medications  . glipiZIDE (GLUCOTROL XL) 10 MG 24 hr tablet    Sig: Take 1 tablet (10 mg total) by mouth daily.    Dispense:  90 tablet    Refill:  2    Follow Up:  Dr. Radford Pax in 3-4 months     Signed, Kathyrn Drown, NP  06/20/2020 11:19 AM    [place]

## 2020-07-05 ENCOUNTER — Encounter (HOSPITAL_COMMUNITY): Payer: Self-pay | Admitting: *Deleted

## 2020-07-05 NOTE — Progress Notes (Signed)
Received referral from Dr. Radford Pax for this pt to participate in Cardiac rehab with the diagnosis of Chronic combined systolic and diastolic heart failure.Clinical review of pt follow up appt on 12/6  with Kathyrn Drown NP - cardiologist office note. Pt is making the expected progress in recovery.  Pt appropriate for scheduling for on site cardiac rehab and/or enrollment in Virtual Cardiac Rehab. Note pt has upcoming encounter for Right eye cataract surgery on 1/20.  Would need to wait at least 2 weeks before engaging in exercise unless directed otherwise by her physician. Pt Covid Risk Score is 4.  Will forward to staff for follow up. Cherre Huger, BSN Cardiac and Training and development officer

## 2020-07-20 ENCOUNTER — Telehealth (HOSPITAL_COMMUNITY): Payer: Self-pay

## 2020-07-20 ENCOUNTER — Encounter (HOSPITAL_COMMUNITY): Payer: Self-pay

## 2020-07-20 NOTE — Telephone Encounter (Signed)
Pt insurance is active and benefits verified through [place] [facility], DED $5,000/$4,872 met, out of pocket $6,850/$6,850 met, co-insurance 30%. no pre-authorization required. Passport, 07/20/2020@3 :22pm, REF# [id number]  Will contact patient to see if she is interested in the Cardiac Rehab Program. If interested, patient will need to complete follow up appt. Once completed, patient will be contacted for scheduling upon review by the RN Navigator.

## 2020-07-20 NOTE — Telephone Encounter (Signed)
Attempted to call pt in regards to cardiac rehab, LMTCB, Mailed letter.

## 2020-08-04 DIAGNOSIS — H25811 Combined forms of age-related cataract, right eye: Secondary | ICD-10-CM | POA: Insufficient documentation

## 2020-08-06 ENCOUNTER — Encounter (INDEPENDENT_AMBULATORY_CARE_PROVIDER_SITE_OTHER): Payer: Self-pay

## 2020-08-08 NOTE — Progress Notes (Unsigned)
Cardiology Office Note   Date:  08/09/2020   ID:  Courtney, Olson Mar 02, 1966, MRN [id number]  PCP:  Judge Stall, FNP (Inactive)    No chief complaint on file.  CAD  Wt Readings from Last 3 Encounters:  08/09/20 208 lb 3.2 oz (94.4 kg)  06/20/20 206 lb (93.4 kg)  04/07/20 213 lb (96.6 kg)       History of Present Illness: Courtney Olson is a 55 y.o. female  with a history ofHTN, DM, paroxysmal atrial fibrillation onXarelto,vasovagal syncope, hyperlipidemia, OSA, obesity, bradycardia, and CAD.    Father with CABG at age 69.    She has lost > 100 lbs in the past. Walking long distances.    She has a remote history of chest painwith prior stress testing in 07/2012 that was found to be  normal.Echocardiogram9/2015 showed normal EF,G1 DD. ETT in 04/2014 was normal. She has had pancreatitis and was diagnosed with a kidney mass and underwent a large resection of a benign mass complicated by multiple infections. In7/2015she had an episode of syncope following severe abdominal pain, felt to be vasovagal in etiology.Acute abdominal pain related to her ulcerative colitisfelt to be initiator. Cardiac monitor in  05/2016 showed NSR (sinus brady-sinus tach 51-127bpm), average HR 73bpm, isoalted atrial couplet.  She was seen by Dr. Radford Pax in 04/2019 with DOE. Echo on 05/06/19 showed EF 60-65%, impaired diastolic relaxation, no acute abnormalities. BNP 05/07/19 atwas mildly elevated at 250therefore HCTZ was stopped and she was started on Lasix. Coronary CTA 05/13/19 was abnormal with concern for CTO of the RCA and moderate prox-mid LAD disease. Dr. Radford Pax recommended cardiac catheterization.LHC then showedmRCA lesion of 80% treated with PCI/DES x1.She does have known residual disease in 1st/2nd diag of 60% but noted to be small vessels. pRCA and dRCA with 25% stenosis. Plan was for medical therapy for residual disease with Plavix only given the need for Xareltofor PAF.    s/p RCA stent.  Tortuous RCA vessel which would require XB RCA type guide for backup if PCI was needed.   Stress test performed 04/07/2020 which showed a normal stress nuclear study with no ischemia or infarct with an LVEF at 75% and normal wall motion.    03/2020 ZIO monitor showed sinus bradycardia, normal sinus rhythm and sinus tachycardia with occasional PVCs therefore Toprol was increased.    Cardiac rehab was cancelled with COVID.    In Dec 2020, She had one episode of chest pain on a weekend after trying to lift some weights.  She took some NTG.   She has had some DOE in Jan 2022, but this has improved with some recent weight loss.        Past Medical History:  Diagnosis Date  . Aortic calcification (HCC)   . Atrophic vaginitis 01/2006  . Bradycardia 05/2014  . CAD (coronary artery disease)    cardiac cath 05/21/2019 showing 110 mRCA, 60% D1 and D2 that were very small and patent LAD and LCx.  She underwent DES to the [facility] with residual 25% stenosis in the dRCA  . Chronic pancolonic ulcerative colitis ([place]) 2015   unknown if chronic.  . CKD (chronic kidney disease), stage II   . Cytomegaloviral hepatitis ([place]) 2005  . Diabetes mellitus without complication ([place]) 10/6960   on Ace inhibitor to protect kidneys  . Gallstones 01/2013  . GERD (gastroesophageal reflux disease)   . Hyperlipidemia   . Hypertension   . Kidney tumor 03/2013   Right  .  Obesity   . OSA (obstructive sleep apnea)   . PAF (paroxysmal atrial fibrillation) ([place])   . Pancreatitis 2014  . Postmenopausal   . RA (rheumatoid arthritis) ([place])   . Syncope and collapse   . Ulcerative colitis ([place]) 2015    Past Surgical History:  Procedure Laterality Date  . CESAREAN SECTION    . CHOLECYSTECTOMY  01/2013  . COLONOSCOPY  2015   Colitis  . CORONARY STENT INTERVENTION N/A 05/21/2019   Procedure: CORONARY STENT INTERVENTION;  Surgeon: Jettie Booze, MD;  Location: [place] [facility];  Service:  Cardiovascular;  Laterality: N/A;  . DILATION AND CURETTAGE OF UTERUS  2002  . HERNIA REPAIR  01/2013  . Kidney and Bladder Stent and tube placed Right 03/2013  . PARTIAL NEPHRECTOMY Right 02/2013   Tumor Removal   . Procedure to remove kidney and bladder stent and tube Right 04/2013  . RIGHT/LEFT HEART CATH AND CORONARY ANGIOGRAPHY N/A 05/21/2019   Procedure: RIGHT/LEFT HEART CATH AND CORONARY ANGIOGRAPHY;  Surgeon: Jettie Booze, MD;  Location: [place] [facility];  Service: Cardiovascular;  Laterality: N/A;  . TOTAL ABDOMINAL HYSTERECTOMY W/ BILATERAL SALPINGOOPHORECTOMY  2002   Secondary to AUB     Current Outpatient Medications  Medication Sig Dispense Refill  . ALPRAZolam (XANAX) 0.25 MG tablet Take 1 tablet by mouth 2 (two) times daily as needed.  0  . atorvastatin (LIPITOR) 80 MG tablet Take 1 tablet (80 mg total) by mouth daily. 90 tablet 3  . Cholecalciferol (VITAMIN D3 PO) Take 1 capsule by mouth as directed.    . clopidogrel (PLAVIX) 75 MG tablet Take 1 tablet (75 mg total) by mouth daily with breakfast. 90 tablet 3  . EPINEPHrine 0.3 mg/0.3 mL IJ SOAJ injection Inject 0.3 mLs into the skin as needed.  2  . ezetimibe (ZETIA) 10 MG tablet Take 1 tablet (10 mg total) by mouth daily. 90 tablet 3  . furosemide (LASIX) 20 MG tablet Take 20 mg by mouth as needed for fluid or edema.    Marland Kitchen glipiZIDE (GLUCOTROL XL) 10 MG 24 hr tablet Take 1 tablet (10 mg total) by mouth daily. 90 tablet 2  . Insulin Glargine (BASAGLAR KWIKPEN) 100 UNIT/ML SOPN Inject 14 Units into the skin 2 (two) times daily.   5  . LORazepam (ATIVAN) 0.5 MG tablet lorazepam 0.5 mg tablet  TAKE 1/2 TO 1 TABLET DAILY AS NEEDED FOR FLYING ORALLY    . mesalamine (LIALDA) 1.2 g EC tablet Take 1.2 g by mouth daily.     . metoprolol succinate (TOPROL-XL) 25 MG 24 hr tablet Take 1 tablet (25 mg total) by mouth daily. 90 tablet 3  . Multiple Vitamin (MULTIVITAMIN) tablet Take by mouth.    . nitroGLYCERIN (NITROSTAT) 0.4 MG  SL tablet Place 1 tablet (0.4 mg total) under the tongue every 5 (five) minutes as needed. 25 tablet 2  . pantoprazole (PROTONIX) 40 MG tablet TAKE 1 TABLET BY MOUTH  DAILY 90 tablet 3  . ramipril (ALTACE) 5 MG capsule Take 1 capsule (5 mg total) by mouth daily. 90 capsule 3  . rivaroxaban (XARELTO) 20 MG TABS tablet Take 1 tablet (20 mg total) by mouth daily. 30 tablet 11  . triamcinolone (KENALOG) 0.1 % Apply topically.    . TRULICITY 1.5 ON/6.2XB SOPN Inject 1.5 mg into the skin every 7 (seven) days.      No current facility-administered medications for this visit.    Allergies:   Orange fruit [  citrus], Other, Bactrim [sulfamethoxazole-trimethoprim], and Tape    Social History:  The patient  reports that she has never smoked. She has never used smokeless tobacco. She reports current alcohol use. She reports that she does not use drugs.   Family History:  The patient's family history includes Cancer in her maternal grandmother and paternal aunt; Diabetes in her father, maternal grandmother, mother, and paternal aunt; Fibromyalgia in her mother; Heart disease in her father; Hypertension in her father, mother, and sister.    ROS:  Please see the history of present illness.   Otherwise, review of systems are positive for intentional weight loss.   All other systems are reviewed and negative.    PHYSICAL EXAM: VS:  BP 112/76   Pulse 63   Ht 5\' 5"  (1.651 m)   Wt 208 lb 3.2 oz (94.4 kg)   LMP 07/16/2000 (Approximate)   SpO2 98%   BMI 34.65 kg/m  , BMI Body mass index is 34.65 kg/m. GEN: Well nourished, well developed, in no acute distress  HEENT: normal  Neck: no JVD, carotid bruits, or masses Cardiac: RRR; no murmurs, rubs, or gallops,no edema  Respiratory:  clear to auscultation bilaterally, normal work of breathing GI: soft, nontender, nondistended, + BS MS: no deformity or atrophy  Skin: warm and dry, no rash Neuro:  Strength and sensation are intact Psych: anxious, euthymic  mood, full affect    Recent Labs: 04/05/2020: BUN 23; Creatinine, Ser 1.05; Hemoglobin 13.1; NT-Pro BNP 237; Platelets 190; Potassium 4.0; Sodium 140 06/14/2020: ALT 27   Lipid Panel    Component Value Date/Time   CHOL 172 06/14/2020 1230   TRIG 150 (H) 06/14/2020 1230   HDL 70 06/14/2020 1230   CHOLHDL 2.5 06/14/2020 1230   CHOLHDL 4 05/17/2014 1113   VLDL 34.4 05/17/2014 1113   LDLCALC 77 06/14/2020 1230     Other studies Reviewed: Additional studies/ records that were reviewed today with results demonstrating: I personally reviewed her cath films form 2020.   ASSESSMENT AND PLAN:  1. CAD: s/p RCA stent.  Tortuous vessel which would require XB RCA type guide for backup if PCI was needed.  No clear angina. Negative stress test.  DOE improving with weight loss.  Check BNP. To assess fluid status and compare to prior readings. 2. PAF: Xarelto for stroke prevention.  In NSR today. 3. HTN: Home readings are controlled.  4. PAD: Mild PAD in legs- no claudication.  Nonhealing sores.  5. Hyperlipidemia: LDL 77.  Contiue statin.  6. DM: Check A1C. Whole food plant based diet recommended.    Current medicines are reviewed at length with the patient today.  The patient concerns regarding her medicines were addressed.  The following changes have been made:  No change  Labs/ tests ordered today include:  No orders of the defined types were placed in this encounter.   Recommend 150 minutes/week of aerobic exercise Low fat, low carb, high fiber diet recommended  Disposition:   FU in 6 months   Signed, Larae Grooms, MD  08/09/2020 2:00 PM    [address], [place], [place]  [address] Phone: [phone]; Fax: [phone]

## 2020-08-09 ENCOUNTER — Encounter: Payer: Self-pay | Admitting: Interventional Cardiology

## 2020-08-09 ENCOUNTER — Ambulatory Visit (INDEPENDENT_AMBULATORY_CARE_PROVIDER_SITE_OTHER): Payer: 59 | Admitting: Interventional Cardiology

## 2020-08-09 ENCOUNTER — Other Ambulatory Visit: Payer: Self-pay

## 2020-08-09 VITALS — BP 112/76 | HR 63 | Ht 65.0 in | Wt 208.2 lb

## 2020-08-09 DIAGNOSIS — E7800 Pure hypercholesterolemia, unspecified: Secondary | ICD-10-CM

## 2020-08-09 DIAGNOSIS — E119 Type 2 diabetes mellitus without complications: Secondary | ICD-10-CM

## 2020-08-09 DIAGNOSIS — I5032 Chronic diastolic (congestive) heart failure: Secondary | ICD-10-CM

## 2020-08-09 DIAGNOSIS — I480 Paroxysmal atrial fibrillation: Secondary | ICD-10-CM

## 2020-08-09 DIAGNOSIS — I2510 Atherosclerotic heart disease of native coronary artery without angina pectoris: Secondary | ICD-10-CM

## 2020-08-09 NOTE — Patient Instructions (Signed)
Medication Instructions:  Your physician recommends that you continue on your current medications as directed. Please refer to the Current Medication list given to you today. *If you need a refill on your cardiac medications before your next appointment, please call your pharmacy*   Lab Work: TODAY:  CMET, A1C, BNP, Fasting lipid panel If you have labs (blood work) drawn today and your tests are completely normal, you will receive your results only by: Marland Kitchen MyChart Message (if you have MyChart) OR . A paper copy in the mail If you have any lab test that is abnormal or we need to change your treatment, we will call you to review the results.   Testing/Procedures: NONE   Follow-Up: At [facility], you and your health needs are our priority.  As part of our continuing mission to provide you with exceptional heart care, we have created designated Provider Care Teams.  These Care Teams include your primary Cardiologist (physician) and Advanced Practice Providers (APPs -  Physician Assistants and Nurse Practitioners) who all work together to provide you with the care you need, when you need it.  We recommend signing up for the patient portal called "MyChart".  Sign up information is provided on this After Visit Summary.  MyChart is used to connect with patients for Virtual Visits (Telemedicine).  Patients are able to view lab/test results, encounter notes, upcoming appointments, etc.  Non-urgent messages can be sent to your provider as well.   To learn more about what you can do with MyChart, go to [email or web address].    Your next appointment:   6 month(s)  The format for your next appointment:   In Person  Provider:   You may see Fransico Him, MD or one of the following Advanced Practice Providers on your designated Care Team:    Melina Copa, PA-C  Ermalinda Barrios, PA-C

## 2020-08-10 ENCOUNTER — Telehealth: Payer: Self-pay | Admitting: *Deleted

## 2020-08-10 DIAGNOSIS — E785 Hyperlipidemia, unspecified: Secondary | ICD-10-CM

## 2020-08-10 DIAGNOSIS — I480 Paroxysmal atrial fibrillation: Secondary | ICD-10-CM

## 2020-08-10 LAB — LIPID PANEL
Chol/HDL Ratio: 2.5 ratio (ref 0.0–4.4)
Cholesterol, Total: 136 mg/dL (ref 100–199)
HDL: 55 mg/dL (ref >39)
LDL Chol Calc (NIH): 59 mg/dL (ref 0–99)
Triglycerides: 128 mg/dL (ref 0–149)
VLDL Cholesterol Cal: 22 mg/dL (ref 5–40)

## 2020-08-10 LAB — COMPREHENSIVE METABOLIC PANEL
ALT: 35 IU/L — ABNORMAL HIGH (ref 0–32)
AST: 33 IU/L (ref 0–40)
Albumin/Globulin Ratio: 1.7 (ref 1.2–2.2)
Albumin: 4.4 g/dL (ref 3.8–4.9)
Alkaline Phosphatase: 64 IU/L (ref 44–121)
BUN/Creatinine Ratio: 16 (ref 9–23)
BUN: 16 mg/dL (ref 6–24)
Bilirubin Total: 0.9 mg/dL (ref 0.0–1.2)
CO2: 24 mmol/L (ref 20–29)
Calcium: 9.9 mg/dL (ref 8.7–10.2)
Chloride: 99 mmol/L (ref 96–106)
Creatinine, Ser: 0.98 mg/dL (ref 0.57–1.00)
GFR calc Af Amer: 76 mL/min/{1.73_m2} (ref >59)
GFR calc non Af Amer: 66 mL/min/{1.73_m2} (ref >59)
Globulin, Total: 2.6 g/dL (ref 1.5–4.5)
Glucose: 219 mg/dL — ABNORMAL HIGH (ref 65–99)
Potassium: 4.6 mmol/L (ref 3.5–5.2)
Sodium: 138 mmol/L (ref 134–144)
Total Protein: 7 g/dL (ref 6.0–8.5)

## 2020-08-10 LAB — PRO B NATRIURETIC PEPTIDE: NT-Pro BNP: 251 pg/mL — ABNORMAL HIGH (ref 0–249)

## 2020-08-10 LAB — HEMOGLOBIN A1C
Est. average glucose Bld gHb Est-mCnc: 186 mg/dL
Hgb A1c MFr Bld: 8.1 % — ABNORMAL HIGH (ref 4.8–5.6)

## 2020-08-10 NOTE — Telephone Encounter (Signed)
I spoke with patient and reviewed lab results with her. She is concerned about elevated ALT.  I told her no changes are usually made with slight elevation and Dr Irish Lack felt LFTs were well controlled.  Patient reports she has recently made a change to a very healthy diet. She is concerned about being on 80 mg Atorvastatin and is asking if Dr Irish Lack thinks dose could be decreased.

## 2020-08-12 MED ORDER — RIVAROXABAN 20 MG PO TABS: 20.0000 mg | ORAL_TABLET | Freq: Every day | ORAL | 1 refills | Status: DC

## 2020-08-12 MED ORDER — METOPROLOL SUCCINATE ER 25 MG PO TB24: 25.0000 mg | ORAL_TABLET | Freq: Every day | ORAL | 3 refills | Status: DC

## 2020-08-12 MED ORDER — ATORVASTATIN CALCIUM 40 MG PO TABS: 40.0000 mg | ORAL_TABLET | Freq: Every day | ORAL | 3 refills | Status: DC

## 2020-08-12 NOTE — Telephone Encounter (Signed)
Prescription refill request for Xarelto received.  Indication:afib Last office visit: 08/09/20 Weight:94.4 kg Age:55 Scr:0.98 on 08/09/20 CrCl: 2ml/min

## 2020-08-12 NOTE — Telephone Encounter (Signed)
I spoke with patient and gave her information from Dr Irish Lack. She will cut 80 mg Atorvastatin tablets in half to use current supply.  Will send new prescription for 40 mg dose to [facility].  Patient will come in for fasting lab work on 10/06/20. Patient requests Metoprolol, Glipizide and Xarelto prescription refills be sent to [facility].  Will send Metoprolol in.  Patient reports glipizide dose has been changed by endocrinology.  I asked her to contact that office for refill. Will send to pharmacist regarding Xarelto refill

## 2020-08-12 NOTE — Telephone Encounter (Signed)
OK to decrease atorvastatin to 40 mg daily.  Recheck liver and lipids in 2 months.  If LDL increases to > 70 in 2 months, would go back to 0 mg dose.   JV

## 2020-08-16 ENCOUNTER — Other Ambulatory Visit: Payer: 59

## 2020-08-22 ENCOUNTER — Telehealth (HOSPITAL_COMMUNITY): Payer: Self-pay | Admitting: Pharmacist

## 2020-08-26 NOTE — Telephone Encounter (Signed)
Cardiac Rehab - Pharmacy Resident Documentation   Patient unable to be reached after three call attempts. Please complete allergy verification and medication review during patient's cardiac rehab appointment.    Fara Olden, PharmD PGY-1 Pharmacy Resident

## 2020-08-30 ENCOUNTER — Encounter (HOSPITAL_COMMUNITY)
Admission: RE | Admit: 2020-08-30 | Discharge: 2020-08-30 | Disposition: A | Discharge status: Home / Self Care | Payer: 59 | Source: Ambulatory Visit | Attending: Interventional Cardiology | Admitting: Interventional Cardiology

## 2020-08-30 ENCOUNTER — Encounter (HOSPITAL_COMMUNITY): Payer: Self-pay

## 2020-08-30 ENCOUNTER — Other Ambulatory Visit: Payer: Self-pay

## 2020-08-30 VITALS — BP 134/78 | HR 72 | Ht 65.0 in | Wt 210.1 lb

## 2020-08-30 DIAGNOSIS — I5042 Chronic combined systolic (congestive) and diastolic (congestive) heart failure: Secondary | ICD-10-CM | POA: Diagnosis present

## 2020-08-30 LAB — GLUCOSE, CAPILLARY
Glucose-Capillary: 301 mg/dL — ABNORMAL HIGH (ref 70–99)
Glucose-Capillary: 289 mg/dL — ABNORMAL HIGH (ref 70–99)

## 2020-08-30 NOTE — Progress Notes (Signed)
Recheck CBG 289. Proceeded with the patient's 6 minute walk test. Asked the patient to make appointment for follow up with her primary care physician  and to bring the name of her  Endocrinologist. Exercise diabetic guidelines reviewed with the patient.Barnet Pall, RN,BSN 08/30/2020 2:46 PM

## 2020-08-30 NOTE — Progress Notes (Signed)
Cardiac Individual Treatment Plan  Patient Details  Name: Courtney Olson MRN: [id number] Date of Birth: June 27, 1966 Referring Provider:   Flowsheet Row CARDIAC REHAB PHASE II ORIENTATION from 08/30/2020 in [place]  Referring Provider Jettie Booze, MD      Initial Encounter Date:  [place] PHASE II ORIENTATION from 08/30/2020 in [place]  Date 08/30/20      Visit Diagnosis: Heart failure, systolic and diastolic, chronic ([place])  Patient's Home Medications on Admission:  Current Outpatient Medications:  .  ALPRAZolam (XANAX) 0.25 MG tablet, Take 1 tablet by mouth 2 (two) times daily as needed., Disp: , Rfl: 0 .  atorvastatin (LIPITOR) 40 MG tablet, Take 1 tablet (40 mg total) by mouth daily., Disp: 90 tablet, Rfl: 3 .  Cholecalciferol (VITAMIN D3 PO), Take 1 capsule by mouth as directed., Disp: , Rfl:  .  clopidogrel (PLAVIX) 75 MG tablet, Take 1 tablet (75 mg total) by mouth daily with breakfast., Disp: 90 tablet, Rfl: 3 .  EPINEPHrine 0.3 mg/0.3 mL IJ SOAJ injection, Inject 0.3 mLs into the skin as needed., Disp: , Rfl: 2 .  ezetimibe (ZETIA) 10 MG tablet, Take 1 tablet (10 mg total) by mouth daily., Disp: 90 tablet, Rfl: 3 .  furosemide (LASIX) 20 MG tablet, Take 20 mg by mouth as needed for fluid or edema., Disp: , Rfl:  .  glipiZIDE (GLUCOTROL XL) 10 MG 24 hr tablet, Take 1 tablet (10 mg total) by mouth daily., Disp: 90 tablet, Rfl: 2 .  Insulin Glargine (BASAGLAR KWIKPEN) 100 UNIT/ML SOPN, Inject 14 Units into the skin 2 (two) times daily. , Disp: , Rfl: 5 .  LORazepam (ATIVAN) 0.5 MG tablet, lorazepam 0.5 mg tablet  TAKE 1/2 TO 1 TABLET DAILY AS NEEDED FOR FLYING ORALLY, Disp: , Rfl:  .  mesalamine (LIALDA) 1.2 g EC tablet, Take 1.2 g by mouth daily. , Disp: , Rfl:  .  metoprolol succinate (TOPROL-XL) 25 MG 24 hr tablet, Take 1 tablet (25 mg total) by mouth daily., Disp: 90 tablet, Rfl:  3 .  Multiple Vitamin (MULTIVITAMIN) tablet, Take by mouth., Disp: , Rfl:  .  nitroGLYCERIN (NITROSTAT) 0.4 MG SL tablet, Place 1 tablet (0.4 mg total) under the tongue every 5 (five) minutes as needed., Disp: 25 tablet, Rfl: 2 .  pantoprazole (PROTONIX) 40 MG tablet, TAKE 1 TABLET BY MOUTH  DAILY, Disp: 90 tablet, Rfl: 3 .  ramipril (ALTACE) 5 MG capsule, Take 1 capsule (5 mg total) by mouth daily., Disp: 90 capsule, Rfl: 3 .  rivaroxaban (XARELTO) 20 MG TABS tablet, Take 1 tablet (20 mg total) by mouth daily., Disp: 90 tablet, Rfl: 1 .  triamcinolone (KENALOG) 0.1 %, Apply topically., Disp: , Rfl:  .  TRULICITY 1.5 SE/8.3TD SOPN, Inject 1.5 mg into the skin every 7 (seven) days. , Disp: , Rfl:   Past Medical History: Past Medical History:  Diagnosis Date  . Aortic calcification (HCC)   . Atrophic vaginitis 01/2006  . Bradycardia 05/2014  . CAD (coronary artery disease)    cardiac cath 05/21/2019 showing 17 mRCA, 60% D1 and D2 that were very small and patent LAD and LCx.  She underwent [facility] to [facility] with residual 25% stenosis in the dRCA  . Chronic pancolonic ulcerative colitis ([place]) 2015   unknown if chronic.  . CKD (chronic kidney disease), stage II   . Cytomegaloviral hepatitis ([place]) 2005  . Diabetes mellitus without  complication ([place]) 08/3760   on Ace inhibitor to protect kidneys  . Gallstones 01/2013  . GERD (gastroesophageal reflux disease)   . Hyperlipidemia   . Hypertension   . Kidney tumor 03/2013   Right  . Obesity   . OSA (obstructive sleep apnea)   . PAF (paroxysmal atrial fibrillation) ([place])   . Pancreatitis 2014  . Postmenopausal   . RA (rheumatoid arthritis) ([place])   . Syncope and collapse   . Ulcerative colitis ([place]) 2015    Tobacco Use: Social History   Tobacco Use  Smoking Status Never Smoker  Smokeless Tobacco Never Used    Labs: Recent Review Flowsheet Data    Labs for ITP Cardiac and Pulmonary Rehab Latest Ref Rng & Units 05/21/2019 05/21/2019  04/05/2020 06/14/2020 08/09/2020   Cholestrol 100 - 199 mg/dL - - 190 172 136   LDLCALC 0 - 99 mg/dL - - 80 77 59   HDL >39 mg/dL - - 84 70 55   Trlycerides 0 - 149 mg/dL - - 155(H) 150(H) 128   Hemoglobin A1c 4.8 - 5.6 % - - 8.9(H) - 8.1(H)   PHART 7.350 - 7.450 7.336(L) - - - -   PCO2ART 32.0 - 48.0 mmHg 49.9(H) - - - -   HCO3 20.0 - 28.0 mmol/L 26.7 27.9 - - -   TCO2 22 - 32 mmol/L 28 29 - - -   O2SAT % 99.0 76.0 - - -      Capillary Blood Glucose: Lab Results  Component Value Date   GLUCAP 289 (H) 08/30/2020   GLUCAP 301 (H) 08/30/2020   GLUCAP 104 (H) 05/21/2019   GLUCAP 274 (H) 09/07/2010   GLUCAP 365 (H) 09/07/2010     Exercise Target Goals: Exercise Program Goal: Individual exercise prescription set using results from initial 6 min walk test and THRR while considering  patient's activity barriers and safety.   Exercise Prescription Goal: Starting with aerobic activity 30 plus minutes a day, 3 days per week for initial exercise prescription. Provide home exercise prescription and guidelines that participant acknowledges understanding prior to discharge.  Activity Barriers & Risk Stratification:  Activity Barriers & Cardiac Risk Stratification - 08/30/20 1118      Activity Barriers & Cardiac Risk Stratification   Activity Barriers Other (comment)    Comments History of RA, now in remission. Low back issues "on and off", Left hip pain with walking, left shoulder pain, limited ROM.    Cardiac Risk Stratification High           6 Minute Walk:  6 Minute Walk    Row Name 08/30/20 1150         6 Minute Walk   Phase Initial     Distance 1718 feet     Walk Time 6 minutes     # of Rest Breaks 0     MPH 3.25     METS 4.27     RPE 12     Perceived Dyspnea  0     VO2 Peak 14.95     Symptoms No     Resting HR 72 bpm     Resting BP 134/78     Resting Oxygen Saturation  97 %     Exercise Oxygen Saturation  during 6 min walk 98 %     Max Ex. HR 108 bpm     Max  Ex. BP 148/84     2 Minute Post BP 120/76  Oxygen Initial Assessment:   Oxygen Re-Evaluation:   Oxygen Discharge (Final Oxygen Re-Evaluation):   Initial Exercise Prescription:  Initial Exercise Prescription - 08/30/20 1200      Date of Initial Exercise RX and Referring Provider   Date 08/30/20    Referring Provider Jettie Booze, MD    Expected Discharge Date 10/28/20      Recumbant Bike   Level 2    Minutes 15    METs 2.3      NuStep   Level 3    SPM 85    Minutes 15    METs 2.5      Prescription Details   Frequency (times per week) 3    Duration Progress to 30 minutes of continuous aerobic without signs/symptoms of physical distress      Intensity   THRR 40-80% of Max Heartrate 66-133    Ratings of Perceived Exertion 11-13    Perceived Dyspnea 0-4      Progression   Progression Continue to progress workloads to maintain intensity without signs/symptoms of physical distress.      Resistance Training   Training Prescription Yes    Weight 3 lbs    Reps 10-15           Perform Capillary Blood Glucose checks as needed.  Exercise Prescription Changes:   Exercise Comments:   Exercise Goals and Review:   Exercise Goals    Row Name 08/30/20 1117             Exercise Goals   Increase Physical Activity Yes       Intervention Provide advice, education, support and counseling about physical activity/exercise needs.;Develop an individualized exercise prescription for aerobic and resistive training based on initial evaluation findings, risk stratification, comorbidities and participant's personal goals.       Expected Outcomes Short Term: Attend rehab on a regular basis to increase amount of physical activity.;Long Term: Exercising regularly at least 3-5 days a week.;Long Term: Add in home exercise to make exercise part of routine and to increase amount of physical activity.       Increase Strength and Stamina Yes       Intervention  Provide advice, education, support and counseling about physical activity/exercise needs.;Develop an individualized exercise prescription for aerobic and resistive training based on initial evaluation findings, risk stratification, comorbidities and participant's personal goals.       Expected Outcomes Short Term: Increase workloads from initial exercise prescription for resistance, speed, and METs.;Short Term: Perform resistance training exercises routinely during rehab and add in resistance training at home;Long Term: Improve cardiorespiratory fitness, muscular endurance and strength as measured by increased METs and functional capacity (6MWT)       Able to understand and use rate of perceived exertion (RPE) scale Yes       Intervention Provide education and explanation on how to use RPE scale       Expected Outcomes Short Term: Able to use RPE daily in rehab to express subjective intensity level;Long Term:  Able to use RPE to guide intensity level when exercising independently       Knowledge and understanding of Target Heart Rate Range (THRR) Yes       Intervention Provide education and explanation of THRR including how the numbers were predicted and where they are located for reference       Expected Outcomes Short Term: Able to state/look up THRR;Long Term: Able to use THRR to govern intensity when exercising independently;Short Term: Able to  use daily as guideline for intensity in rehab       Able to check pulse independently Yes       Intervention Provide education and demonstration on how to check pulse in carotid and radial arteries.;Review the importance of being able to check your own pulse for safety during independent exercise       Expected Outcomes Short Term: Able to explain why pulse checking is important during independent exercise;Long Term: Able to check pulse independently and accurately       Understanding of Exercise Prescription Yes       Intervention Provide education,  explanation, and written materials on patient's individual exercise prescription       Expected Outcomes Short Term: Able to explain program exercise prescription;Long Term: Able to explain home exercise prescription to exercise independently              Exercise Goals Re-Evaluation :    Discharge Exercise Prescription (Final Exercise Prescription Changes):   Nutrition:  Target Goals: Understanding of nutrition guidelines, daily intake of sodium 1500mg , cholesterol 200mg , calories 30% from fat and 7% or less from saturated fats, daily to have 5 or more servings of fruits and vegetables.  Biometrics:  Pre Biometrics - 08/30/20 1113      Pre Biometrics   Waist Circumference 42 inches    Hip Circumference 48.5 inches    Waist to Hip Ratio 0.87 %    Triceps Skinfold 35 mm    % Body Fat 45.3 %    Grip Strength 30.5 kg    Flexibility 14.5 in    Single Leg Stand 5.75 seconds            Nutrition Therapy Plan and Nutrition Goals:   Nutrition Assessments:  MEDIFICTS Score Key:  ?70 Need to make dietary changes   40-70 Heart Healthy Diet  ? 40 Therapeutic Level Cholesterol Diet   Picture Your Plate Scores:  <37 Unhealthy dietary pattern with much room for improvement.  41-50 Dietary pattern unlikely to meet recommendations for good health and room for improvement.  51-60 More healthful dietary pattern, with some room for improvement.   >60 Healthy dietary pattern, although there may be some specific behaviors that could be improved.    Nutrition Goals Re-Evaluation:   Nutrition Goals Discharge (Final Nutrition Goals Re-Evaluation):   Psychosocial: Target Goals: Acknowledge presence or absence of significant depression and/or stress, maximize coping skills, provide positive support system. Participant is able to verbalize types and ability to use techniques and skills needed for reducing stress and depression.  Initial Review & Psychosocial Screening:   Initial Psych Review & Screening - 08/30/20 1606      Initial Review   Current issues with Current Stress Concerns    Source of Stress Concerns Chronic Illness;Family    Comments Salam has lots of stress concerns related to her own health and her mother's health. Teniya's mother lives in [place]      Family Dynamics   Good Support System? Yes   Agata has her husband and children for support     Barriers   Psychosocial barriers to participate in program The patient should benefit from training in stress management and relaxation.      Screening Interventions   Interventions Encouraged to exercise;Provide feedback about the scores to participant    Expected Outcomes Long Term Goal: Stressors or current issues are controlled or eliminated.;Short Term goal: Identification and review with participant of any Quality of Life or Depression concerns  found by scoring the questionnaire.           Quality of Life Scores:  Quality of Life - 08/30/20 1331      Quality of Life   Select Quality of Life      Quality of Life Scores   Health/Function Pre 20.82 %    Socioeconomic Pre 24.43 %    Psych/Spiritual Pre 24.21 %    Family Pre 27.3 %    GLOBAL Pre 23.29 %          Scores of 19 and below usually indicate a poorer quality of life in these areas.  A difference of  2-3 points is a clinically meaningful difference.  A difference of 2-3 points in the total score of the Quality of Life Index has been associated with significant improvement in overall quality of life, self-image, physical symptoms, and general health in studies assessing change in quality of life.  PHQ-9: Recent Review Flowsheet Data    Depression screen [facility] 2/9 08/30/2020   Decreased Interest 0   Down, Depressed, Hopeless 0   PHQ - 2 Score 0     Interpretation of Total Score  Total Score Depression Severity:  1-4 = Minimal depression, 5-9 = Mild depression, 10-14 = Moderate depression, 15-19 = Moderately severe  depression, 20-27 = Severe depression   Psychosocial Evaluation and Intervention:   Psychosocial Re-Evaluation:   Psychosocial Discharge (Final Psychosocial Re-Evaluation):   Vocational Rehabilitation: Provide vocational rehab assistance to qualifying candidates.   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 Participant will state understanding/return demonstration of topics presented.  Learning Barriers/Preferences:  Learning Barriers/Preferences - 08/30/20 1609      Learning Barriers/Preferences   Learning Barriers Sight   Wears glasses   Learning Preferences Skilled Demonstration           Education Topics: Hypertension, Hypertension Reduction -Define heart disease and high blood pressure. Discus how high blood pressure affects the body and ways to reduce high blood pressure.   Exercise and Your Heart -Discuss why it is important to exercise, the FITT principles of exercise, normal and abnormal responses to exercise, and how to exercise safely.   Angina -Discuss definition of angina, causes of angina, treatment of angina, and how to decrease risk of having angina.   Cardiac Medications -Review what the following cardiac medications are used for, how they affect the body, and side effects that may occur when taking the medications.  Medications include Aspirin, Beta blockers, calcium channel blockers, ACE Inhibitors, angiotensin receptor blockers, diuretics, digoxin, and antihyperlipidemics.   Congestive Heart Failure -Discuss the definition of CHF, how to live with CHF, the signs and symptoms of CHF, and how keep track  of weight and sodium intake.   Heart Disease and Intimacy -Discus the effect sexual activity has on the heart, how changes occur during intimacy as we age, and safety during sexual activity.   Smoking Cessation / COPD -Discuss different methods to quit smoking, the health benefits of quitting smoking, and the definition of COPD.   Nutrition I: Fats -Discuss the types of cholesterol, what cholesterol does to the heart,  and how cholesterol levels can be controlled.   Nutrition II: Labels -Discuss the different components of food labels and how to read food label   Heart Parts/Heart Disease and PAD -Discuss the anatomy of the heart, the pathway of blood circulation through the heart, and these are affected by heart disease.   Stress I: Signs and Symptoms -Discuss the causes of stress, how stress may lead to anxiety and depression, and ways to limit stress.   Stress II: Relaxation -Discuss different types of relaxation techniques to limit stress.   Warning Signs of Stroke / TIA -Discuss definition of a stroke, what the signs and symptoms are of a stroke, and how to identify when someone is having stroke.   Knowledge Questionnaire Score:  Knowledge Questionnaire Score - 08/30/20 1323      Knowledge Questionnaire Score   Pre Score 20/24           Core Components/Risk Factors/Patient Goals at Admission:  Personal Goals and Risk Factors at Admission - 08/30/20 1117      Core Components/Risk Factors/Patient Goals on Admission    Weight Management Yes;Obesity;Weight Loss    Intervention Weight Management/Obesity: Establish reasonable short term and long term weight goals.;Obesity: Provide education and appropriate resources to help participant work on and attain dietary goals.    Admit Weight 210 lb 1.6 oz (95.3 kg)    Goal Weight: Short Term 190 lb (86.2 kg)    Goal Weight: Long Term 170 lb (77.1 kg)    Expected Outcomes Short Term: Continue to assess and modify  interventions until short term weight is achieved;Weight Loss: Understanding of general recommendations for a balanced deficit meal plan, which promotes 1-2 lb weight loss per week and includes a negative energy balance of [phone] kcal/d;Long Term: Adherence to nutrition and physical activity/exercise program aimed toward attainment of established weight goal    Diabetes Yes    Intervention Provide education about signs/symptoms and action to take for hypo/hyperglycemia.;Provide education about proper nutrition, including hydration, and aerobic/resistive exercise prescription along with prescribed medications to achieve blood glucose in normal ranges: Fasting glucose 65-99 mg/dL    Expected Outcomes Short Term: Participant verbalizes understanding of the signs/symptoms and immediate care of hyper/hypoglycemia, proper foot care and importance of medication, aerobic/resistive exercise and nutrition plan for blood glucose control.;Long Term: Attainment of HbA1C < 7%.    Heart Failure Yes    Intervention Provide a combined exercise and nutrition program that is supplemented with education, support and counseling about heart failure. Directed toward relieving symptoms such as shortness of breath, decreased exercise tolerance, and extremity edema.    Expected Outcomes Improve functional capacity of life;Short term: Attendance in program 2-3 days a week with increased exercise capacity. Reported lower sodium intake. Reported increased fruit and vegetable intake. Reports medication compliance.;Short term: Daily weights obtained and reported for increase. Utilizing diuretic protocols set by physician.;Long term: Adoption of self-care skills and reduction of barriers for early signs and symptoms recognition and intervention leading to self-care maintenance.    Hypertension Yes    Intervention Provide education on lifestyle modifcations including regular physical activity/exercise, weight management, moderate sodium  restriction and increased consumption of fresh fruit, vegetables, and low fat dairy, alcohol moderation, and smoking cessation.;Monitor prescription use compliance.    Expected Outcomes Short Term: Continued assessment and intervention until BP is < 140/47mm HG in hypertensive participants. < 130/35mm HG in hypertensive participants with diabetes, heart failure or chronic kidney disease.;Long Term: Maintenance of blood pressure at goal levels.  Lipids Yes    Intervention Provide education and support for participant on nutrition & aerobic/resistive exercise along with prescribed medications to achieve LDL 70mg , HDL >40mg .    Expected Outcomes Short Term: Participant states understanding of desired cholesterol values and is compliant with medications prescribed. Participant is following exercise prescription and nutrition guidelines.;Long Term: Cholesterol controlled with medications as prescribed, with individualized exercise RX and with personalized nutrition plan. Value goals: LDL < 70mg , HDL > 40 mg.    Stress Yes    Intervention Offer individual and/or small group education and counseling on adjustment to heart disease, stress management and health-related lifestyle change. Teach and support self-help strategies.;Refer participants experiencing significant psychosocial distress to appropriate mental health specialists for further evaluation and treatment. When possible, include family members and significant others in education/counseling sessions.    Expected Outcomes Short Term: Participant demonstrates changes in health-related behavior, relaxation and other stress management skills, ability to obtain effective social support, and compliance with psychotropic medications if prescribed.;Long Term: Emotional wellbeing is indicated by absence of clinically significant psychosocial distress or social isolation.           Core Components/Risk Factors/Patient Goals Review:    Core Components/Risk  Factors/Patient Goals at Discharge (Final Review):    ITP Comments:  ITP Comments    Row Name 08/30/20 1318           ITP Comments Medical Director- Dr. Fransico Him, MD              Comments: Claiborne Billings attended orientation on 08/30/2020 to review rules and guidelines for program.  Completed 6 minute walk test, Intitial ITP, and exercise prescription.  VSS. Telemetry-Sinus Rhythm.  Asymptomatic. Safety measures and social distancing in place per CDC guidelines. See previous notes regarding CBG's.Barnet Pall, RN,BSN 08/30/2020 4:16 PM

## 2020-08-30 NOTE — Progress Notes (Signed)
Cardiac Rehab Medication Review by a nurse  Does the patient  feel that his/her medications are working for him/her?  yes  Has the patient been experiencing any side effects to the medications prescribed?  no  Does the patient measure his/her own blood pressure or blood glucose at home?  yes   Does the patient have any problems obtaining medications due to transportation or finances?   no  Understanding of regimen: good Understanding of indications: good Potential of compliance: fair    Nurse comments: Courtney Olson says she is taking her medications as prescribed although she did not take her insulin today and checked her CBG yesterday. CBG was checked today at cardiac rehab orientation. Please see previous note    [name] RN 08/30/2020 2:50 PM

## 2020-08-30 NOTE — Progress Notes (Signed)
CBG 301 this morning. Patient presents for cardiac rehab orientation. Courtney Olson reports that she has not taken her insulin and was late arriving to her cardiac rehab orientation appointment. Dr Olen Pel office called and notified. Patient has not has an office follow up since January of 2021. Patient was advised to call the office for an appointment. Patient asymptomatic. Drank a bottle of water will recheck CBG in 15 minutes.Barnet Pall, RN,BSN 08/30/2020 11:30 AM

## 2020-09-03 ENCOUNTER — Other Ambulatory Visit: Payer: Self-pay | Admitting: Cardiology

## 2020-09-05 ENCOUNTER — Encounter (HOSPITAL_COMMUNITY)
Admission: RE | Admit: 2020-09-05 | Discharge: 2020-09-05 | Disposition: A | Discharge status: Home / Self Care | Payer: 59 | Source: Ambulatory Visit | Attending: Interventional Cardiology | Admitting: Interventional Cardiology

## 2020-09-05 ENCOUNTER — Other Ambulatory Visit: Payer: Self-pay

## 2020-09-05 DIAGNOSIS — I5042 Chronic combined systolic (congestive) and diastolic (congestive) heart failure: Secondary | ICD-10-CM

## 2020-09-05 LAB — GLUCOSE, CAPILLARY
Glucose-Capillary: 192 mg/dL — ABNORMAL HIGH (ref 70–99)
Glucose-Capillary: 181 mg/dL — ABNORMAL HIGH (ref 70–99)

## 2020-09-05 NOTE — Progress Notes (Signed)
Daily Session Note  Patient Details  Name: Courtney Olson MRN: [id number] Date of Birth: May 11, 1966 Referring Provider:   Flowsheet Row CARDIAC REHAB PHASE II ORIENTATION from 08/30/2020 in [facility]  Referring Provider Jettie Booze, MD      Encounter Date: 09/05/2020  Check In:  Session Check In - 09/05/20 0724      Check-In   Supervising physician immediately available to respond to emergencies [facility] immediately available    Physician(s) Dr. Sloan Leiter    Location [facility]    Staff Present Leda Roys, MS, ACSM-CEP, Exercise Physiologist;Olinty Celesta Aver, MS, ACSM CEP, Exercise Physiologist;Maria Venetia Maxon, RN, BSN;Other   [facility]   Virtual Visit No    Medication changes reported     No    Fall or balance concerns reported    No    Tobacco Cessation No Change    Warm-up and Cool-down Performed on first and last piece of equipment    Resistance Training Performed Yes    VAD Patient? No    PAD/SET Patient? No      Pain Assessment   Currently in Pain? No/denies    Pain Score 0-No pain    Multiple Pain Sites No           Capillary Blood Glucose: Results for orders placed or performed during the hospital encounter of 09/05/20 (from the past 24 hour(s))  Glucose, capillary     Status: Abnormal   Collection Time: 09/05/20  7:13 AM  Result Value Ref Range   Glucose-Capillary 192 (H) 70 - 99 mg/dL  Glucose, capillary     Status: Abnormal   Collection Time: 09/05/20  8:01 AM  Result Value Ref Range   Glucose-Capillary 181 (H) 70 - 99 mg/dL     Exercise Prescription Changes - 09/05/20 0720      Response to Exercise   Blood Pressure (Admit) 152/80    Blood Pressure (Exercise) 158/80    Blood Pressure (Exit) 102/69    Heart Rate (Admit) 72 bpm    Heart Rate (Exercise) 111 bpm    Heart Rate (Exit) 80 bpm    Rating of Perceived Exertion (Exercise) 13    Symptoms none    Comments First day of  exercise, tolerated well.    Duration Continue with 30 min of aerobic exercise without signs/symptoms of physical distress.    Intensity THRR unchanged      Progression   Progression Continue to progress workloads to maintain intensity without signs/symptoms of physical distress.    Average METs 2.4      Resistance Training   Training Prescription Yes    Weight 3 lbs    Reps 10-15    Time 10 Minutes      Recumbant Bike   Level 2    Minutes 15    METs 2.6      NuStep   Level 3    SPM 85    Minutes 15    METs 2.2           Social History   Tobacco Use  Smoking Status Never Smoker  Smokeless Tobacco Never Used    Goals Met:  Exercise tolerated well No report of cardiac concerns or symptoms Strength training completed today  Goals Unmet:  Not Applicable  Comments: [place] started cardiac rehab today.  Pt tolerated light exercise without difficulty. VSS, telemetry-Sinus Rhythm, asymptomatic.  Medication list reconciled. Pt denies barriers to medicaiton compliance.  PSYCHOSOCIAL  ASSESSMENT:  PHQ-0. Pt exhibits positive coping skills, hopeful outlook with supportive family. No psychosocial needs identified at this time, no psychosocial interventions necessary.    Pt enjoys walking.   Pt oriented to exercise equipment and routine.    Understanding verbalized. Rise's CBG's were improved today. Adalae has a follow up appointment with her primary care provider on Thursday 09/08/20.Barnet Pall, RN,BSN 09/05/2020 4:34 PM   Dr. Fransico Him is Medical Director for Cardiac Rehab at [facility].

## 2020-09-07 ENCOUNTER — Encounter (HOSPITAL_COMMUNITY)
Admission: RE | Admit: 2020-09-07 | Discharge: 2020-09-07 | Disposition: A | Discharge status: Home / Self Care | Payer: 59 | Source: Ambulatory Visit | Attending: Interventional Cardiology | Admitting: Interventional Cardiology

## 2020-09-07 ENCOUNTER — Other Ambulatory Visit: Payer: Self-pay

## 2020-09-07 DIAGNOSIS — I5042 Chronic combined systolic (congestive) and diastolic (congestive) heart failure: Secondary | ICD-10-CM

## 2020-09-07 LAB — GLUCOSE, CAPILLARY
Glucose-Capillary: 144 mg/dL — ABNORMAL HIGH (ref 70–99)
Glucose-Capillary: 150 mg/dL — ABNORMAL HIGH (ref 70–99)

## 2020-09-07 NOTE — Progress Notes (Signed)
Courtney Olson 55 y.o. female Nutrition Note  Diagnosis: Chronic combined heart failure  Past Medical History:  Diagnosis Date  . Aortic calcification (HCC)   . Atrophic vaginitis 01/2006  . Bradycardia 05/2014  . CAD (coronary artery disease)    cardiac cath 05/21/2019 showing 63 mRCA, 60% D1 and D2 that were very small and patent LAD and LCx.  She underwent DES to the [facility] with residual 25% stenosis in the dRCA  . Chronic pancolonic ulcerative colitis ([place]) 2015   unknown if chronic.  . CKD (chronic kidney disease), stage II   . Cytomegaloviral hepatitis ([place]) 2005  . Diabetes mellitus without complication ([place]) 08/1306   on Ace inhibitor to protect kidneys  . Gallstones 01/2013  . GERD (gastroesophageal reflux disease)   . Hyperlipidemia   . Hypertension   . Kidney tumor 03/2013   Right  . Obesity   . OSA (obstructive sleep apnea)   . PAF (paroxysmal atrial fibrillation) ([place])   . Pancreatitis 2014  . Postmenopausal   . RA (rheumatoid arthritis) ([place])   . Syncope and collapse   . Ulcerative colitis ([place]) 2015     Medications reviewed.   Current Outpatient Medications:  .  ALPRAZolam (XANAX) 0.25 MG tablet, Take 1 tablet by mouth 2 (two) times daily as needed., Disp: , Rfl: 0 .  atorvastatin (LIPITOR) 40 MG tablet, TAKE 1 TABLET(40 MG) BY MOUTH DAILY, Disp: 90 tablet, Rfl: 3 .  Cholecalciferol (VITAMIN D3 PO), Take 1 capsule by mouth as directed., Disp: , Rfl:  .  clopidogrel (PLAVIX) 75 MG tablet, Take 1 tablet (75 mg total) by mouth daily with breakfast., Disp: 90 tablet, Rfl: 3 .  EPINEPHrine 0.3 mg/0.3 mL IJ SOAJ injection, Inject 0.3 mLs into the skin as needed., Disp: , Rfl: 2 .  ezetimibe (ZETIA) 10 MG tablet, Take 1 tablet (10 mg total) by mouth daily., Disp: 90 tablet, Rfl: 3 .  furosemide (LASIX) 20 MG tablet, Take 20 mg by mouth as needed for fluid or edema., Disp: , Rfl:  .  glipiZIDE (GLUCOTROL XL) 10 MG 24 hr tablet, Take 1 tablet (10 mg total) by mouth  daily., Disp: 90 tablet, Rfl: 2 .  Insulin Glargine (BASAGLAR KWIKPEN) 100 UNIT/ML SOPN, Inject 14 Units into the skin 2 (two) times daily. , Disp: , Rfl: 5 .  LORazepam (ATIVAN) 0.5 MG tablet, lorazepam 0.5 mg tablet  TAKE 1/2 TO 1 TABLET DAILY AS NEEDED FOR FLYING ORALLY, Disp: , Rfl:  .  mesalamine (LIALDA) 1.2 g EC tablet, Take 1.2 g by mouth daily. , Disp: , Rfl:  .  metoprolol succinate (TOPROL-XL) 25 MG 24 hr tablet, Take 1 tablet (25 mg total) by mouth daily., Disp: 90 tablet, Rfl: 3 .  Multiple Vitamin (MULTIVITAMIN) tablet, Take by mouth., Disp: , Rfl:  .  nitroGLYCERIN (NITROSTAT) 0.4 MG SL tablet, Place 1 tablet (0.4 mg total) under the tongue every 5 (five) minutes as needed., Disp: 25 tablet, Rfl: 2 .  pantoprazole (PROTONIX) 40 MG tablet, TAKE 1 TABLET BY MOUTH  DAILY, Disp: 90 tablet, Rfl: 3 .  ramipril (ALTACE) 5 MG capsule, Take 1 capsule (5 mg total) by mouth daily., Disp: 90 capsule, Rfl: 3 .  rivaroxaban (XARELTO) 20 MG TABS tablet, Take 1 tablet (20 mg total) by mouth daily., Disp: 90 tablet, Rfl: 1 .  triamcinolone (KENALOG) 0.1 %, Apply topically., Disp: , Rfl:  .  TRULICITY 1.5 MV/7.8IO SOPN, Inject 1.5 mg into the skin every 7 (  seven) days. , Disp: , Rfl:    Ht Readings from Last 1 Encounters:  08/30/20 5\' 5"  (1.651 m)     Wt Readings from Last 3 Encounters:  08/30/20 210 lb 1.6 oz (95.3 kg)  08/09/20 208 lb 3.2 oz (94.4 kg)  06/20/20 206 lb (93.4 kg)     There is no height or weight on file to calculate BMI.   Social History   Tobacco Use  Smoking Status Never Smoker  Smokeless Tobacco Never Used     Lab Results  Component Value Date   CHOL 136 08/09/2020   Lab Results  Component Value Date   HDL 55 08/09/2020   Lab Results  Component Value Date   LDLCALC 59 08/09/2020   Lab Results  Component Value Date   TRIG 128 08/09/2020     Lab Results  Component Value Date   HGBA1C 8.1 (H) 08/09/2020     CBG (last 3)  Recent Labs     09/05/20 0713 09/05/20 0801 09/07/20 0715  GLUCAP 192* 181* 144*     Nutrition Note  Spoke with pt. Nutrition Plan and Nutrition Survey goals reviewed with pt. Pt is following a Heart Healthy diet. Pt wants to lose wt. Pt has been trying to lose wt by doing the E2M diet and fitness plan. She previously lost 170 lbs by walking and eating healthier. She was walking 25k steps per day. (Lowest weight 169 lbs). She feels her happiest weight was at 180-190 lbs.  Pt has Type 2 Diabetes. Last A1c indicates blood glucose well-controlled. This Probation officer went over Diabetes Education test results. Pt checks CBG's 5 times per week. Fasting CBG's reportedly 195-295 mg/dL.  Her goal A1C is 5.9. She is hypo aware. Feels fearful/not normal with CBGs at 100 mg/dl.  She reviewed her diabetes medications. She is being switched to toujeo (vs basaglar) due to insurance coverage. Her MD increased dose to 15 units BID.  She is interested in DM Edu. She has never had any formal diabetes education.  We discussed CGM - pt is not interested at this time.  Pt with dx of CHF. Per discussion, pt does not use canned/convenience foods often. Pt does add salt to food. Pt does not eat out frequently.  Pt expressed understanding of the information reviewed.   Nutrition Diagnosis  ? Excessive carbohydrate intake related to lack of previous diabetes education and food related knowledge as evidenced by A1C 8.1 and fasting CBG 195-295 mg/dl and pt report of no dm education  Nutrition Intervention ? Pt's individual nutrition plan reviewed with pt. ? Diabetes education/Modified carb diet    ? Continue client-centered nutrition education by RD, as part of interdisciplinary care.  Goal(s) ? Pt to identify food quantities necessary to achieve weight loss of 6-24 lb at graduation from cardiac rehab.  ? Improved blood glucose control as evidenced by pt's A1c trending from 8.1 toward less than 7.0. ? Pt with increased percentage of  CBGs in normal range.  Plan:    Will provide client-centered nutrition education as part of interdisciplinary care  Monitor and evaluate progress toward nutrition goal with team.   Michaele Offer, MS, RDN, LDN

## 2020-09-09 ENCOUNTER — Other Ambulatory Visit: Payer: Self-pay

## 2020-09-09 ENCOUNTER — Encounter (HOSPITAL_COMMUNITY)
Admission: RE | Admit: 2020-09-09 | Discharge: 2020-09-09 | Disposition: A | Discharge status: Home / Self Care | Payer: 59 | Source: Ambulatory Visit | Attending: Interventional Cardiology | Admitting: Interventional Cardiology

## 2020-09-09 DIAGNOSIS — I5042 Chronic combined systolic (congestive) and diastolic (congestive) heart failure: Secondary | ICD-10-CM

## 2020-09-09 NOTE — Progress Notes (Signed)
Nutrition Note Spoke with pt today about carbohydrate counting, and eating a consistent amount of carbohydrates across the day. Reviewed the benefits of carbohydrate counting and eating a consistent amount of carbohydrates across the day. Showed pt how to calculate carbohydrate servings, and distributed handouts for patient to practice. Recommended pt eat 2-3 (30-45 g) servings of carbohydrates at meals. Discussed the importance of creating a balanced meal with the addition of protein and non-starchy vegetables. She is currently eating very low carb meals. Her 2 hr post prandial CBGs are still typically >180 mg/dl. She recently started working with new PCP. She has been discussing her diabetes medications. She has been on starter dose of trulicity and expressed interested in meal time insulin. We discussed these medications and encouraged her to discuss with MD.  We also discussed diabetes technology. She is interested in [facility] G6 and InPen. Provided pt with sample DEXCOM to try and information for prescription from MD.   Pt verbalized understanding of material discussed today. Distributed RD contact information.    Nutrition Diagnosis    Excessive carbohydrate intake related to lack of previous diabetes education and food related knowledge as evidenced by A1C 8.1 and fasting CBG 195-295 mg/dl and pt report of no dm education  Nutrition Intervention   Pt's individual nutrition plan reviewed with pt.  Diabetes education/Modified carb diet                  Continue client-centered nutrition education by RD, as part of interdisciplinary care.  Goal(s)  Pt to identify food quantities necessary to achieve weight loss of 6-24 lb at graduation from cardiac rehab.   Improved blood glucose control as evidenced by pt's A1c trending from 8.1 toward less than 7.0.  Pt with increased percentage of CBGs in normal range.  Plan:   Will provide client-centered nutrition education as part of  interdisciplinary care  Monitor and evaluate progress toward nutrition goal with team.   Michaele Offer, MS, RDN, LDN

## 2020-09-09 NOTE — Progress Notes (Signed)
Reviewed home exercise guidelines with patient including endpoints, temperature precautions, target heart rate and rate of perceived exertion. Patient is currently riding her Peloton bike and walking 30-45 minutes daily as her mode of home exercise. Patient has 5 and 10 lbs weight and a weight bench at home for her resistance exercise. [facility] Patient has a smart watch to monitor her heart rate. Patient voices understanding of instructions given.  Sol Passer, MS, ACSM CEP

## 2020-09-12 ENCOUNTER — Other Ambulatory Visit: Payer: Self-pay

## 2020-09-12 ENCOUNTER — Encounter (HOSPITAL_COMMUNITY)
Admission: RE | Admit: 2020-09-12 | Discharge: 2020-09-12 | Disposition: A | Discharge status: Home / Self Care | Payer: 59 | Source: Ambulatory Visit | Attending: Interventional Cardiology | Admitting: Interventional Cardiology

## 2020-09-12 VITALS — BP 114/72 | HR 65 | Ht 65.0 in | Wt 207.0 lb

## 2020-09-12 DIAGNOSIS — I5042 Chronic combined systolic (congestive) and diastolic (congestive) heart failure: Secondary | ICD-10-CM | POA: Diagnosis not present

## 2020-09-12 NOTE — Progress Notes (Signed)
Discharge Progress Report  Patient Details  Name: Courtney Olson MRN: [id number] Date of Birth: 30-Sep-1965 Referring Provider:   Flowsheet Row CARDIAC REHAB PHASE II ORIENTATION from 08/30/2020 in [facility]  Referring Provider Jettie Booze, MD       Number of Visits: 4 of 24  Reason for Discharge:  Patient reached a stable level of exercise. Patient independent in their exercise. Early Exit:  Insurance  Smoking History:  Social History   Tobacco Use  Smoking Status Never Smoker  Smokeless Tobacco Never Used    Diagnosis:  Heart failure, systolic and diastolic, chronic ([place])  ADL [facility]:   Initial Exercise Prescription:  Initial Exercise Prescription - 08/30/20 1200      Date of Initial Exercise RX and Referring Provider   Date 08/30/20    Referring Provider Jettie Booze, MD    Expected Discharge Date 10/28/20      Recumbant Bike   Level 2    Minutes 15    METs 2.3      NuStep   Level 3    SPM 85    Minutes 15    METs 2.5      Prescription Details   Frequency (times per week) 3    Duration Progress to 30 minutes of continuous aerobic without signs/symptoms of physical distress      Intensity   THRR 40-80% of Max Heartrate 66-133    Ratings of Perceived Exertion 11-13    Perceived Dyspnea 0-4      Progression   Progression Continue to progress workloads to maintain intensity without signs/symptoms of physical distress.      Resistance Training   Training Prescription Yes    Weight 3 lbs    Reps 10-15           Discharge Exercise Prescription (Final Exercise Prescription Changes):  Exercise Prescription Changes - 09/12/20 0714      Response to Exercise   Blood Pressure (Admit) 114/72    Blood Pressure (Exercise) 122/70    Blood Pressure (Exit) 94/66    Heart Rate (Admit) 65 bpm    Heart Rate (Exercise) 100 bpm    Heart Rate (Exit) 70 bpm    Rating of Perceived Exertion (Exercise) 12     Symptoms none    Duration Continue with 30 min of aerobic exercise without signs/symptoms of physical distress.    Intensity THRR unchanged      Progression   Progression Continue to progress workloads to maintain intensity without signs/symptoms of physical distress.    Average METs 2.5      Resistance Training   Training Prescription Yes    Weight 3 lbs    Reps 10-15    Time 10 Minutes      Recumbant Bike   Level 2    Minutes 15    METs 2.4      NuStep   Level 3    SPM 85    Minutes 15    METs 2.6      Home Exercise Plan   Plans to continue exercise at Home (comment)   Walking Peloton bike   Frequency Add 4 additional days to program exercise sessions.    Initial Home Exercises Provided 09/09/20           Functional Capacity:  6 Minute Walk    Row Name 08/30/20 1150         6 Minute Walk   Phase Initial  Distance 1718 feet     Walk Time 6 minutes     # of Rest Breaks 0     MPH 3.25     METS 4.27     RPE 12     Perceived Dyspnea  0     VO2 Peak 14.95     Symptoms No     Resting HR 72 bpm     Resting BP 134/78     Resting Oxygen Saturation  97 %     Exercise Oxygen Saturation  during 6 min walk 98 %     Max Ex. HR 108 bpm     Max Ex. BP 148/84     2 Minute Post BP 120/76            Psychological, QOL, Others - Outcomes: PHQ 2/9: Depression screen [facility] 2/9 09/12/2020 08/30/2020  Decreased Interest 0 0  Down, Depressed, Hopeless 0 0  PHQ - 2 Score 0 0    Quality of Life:  Quality of Life - 08/30/20 1331      Quality of Life   Select Quality of Life      Quality of Life Scores   Health/Function Pre 20.82 %    Socioeconomic Pre 24.43 %    Psych/Spiritual Pre 24.21 %    Family Pre 27.3 %    GLOBAL Pre 23.29 %           Personal Goals: Goals established at orientation with interventions provided to work toward goal.  Personal Goals and Risk Factors at Admission - 08/30/20 1117      Core Components/Risk Factors/Patient Goals on  Admission    Weight Management Yes;Obesity;Weight Loss    Intervention Weight Management/Obesity: Establish reasonable short term and long term weight goals.;Obesity: Provide education and appropriate resources to help participant work on and attain dietary goals.    Admit Weight 210 lb 1.6 oz (95.3 kg)    Goal Weight: Short Term 190 lb (86.2 kg)    Goal Weight: Long Term 170 lb (77.1 kg)    Expected Outcomes Short Term: Continue to assess and modify interventions until short term weight is achieved;Weight Loss: Understanding of general recommendations for a balanced deficit meal plan, which promotes 1-2 lb weight loss per week and includes a negative energy balance of [id number] kcal/d;Long Term: Adherence to nutrition and physical activity/exercise program aimed toward attainment of established weight goal    Diabetes Yes    Intervention Provide education about signs/symptoms and action to take for hypo/hyperglycemia.;Provide education about proper nutrition, including hydration, and aerobic/resistive exercise prescription along with prescribed medications to achieve blood glucose in normal ranges: Fasting glucose 65-99 mg/dL    Expected Outcomes Short Term: Participant verbalizes understanding of the signs/symptoms and immediate care of hyper/hypoglycemia, proper foot care and importance of medication, aerobic/resistive exercise and nutrition plan for blood glucose control.;Long Term: Attainment of HbA1C < 7%.    Heart Failure Yes    Intervention Provide a combined exercise and nutrition program that is supplemented with education, support and counseling about heart failure. Directed toward relieving symptoms such as shortness of breath, decreased exercise tolerance, and extremity edema.    Expected Outcomes Improve functional capacity of life;Short term: Attendance in program 2-3 days a week with increased exercise capacity. Reported lower sodium intake. Reported increased fruit and vegetable intake.  Reports medication compliance.;Short term: Daily weights obtained and reported for increase. Utilizing diuretic protocols set by physician.;Long term: Adoption of self-care skills and reduction of barriers for  early signs and symptoms recognition and intervention leading to self-care maintenance.    Hypertension Yes    Intervention Provide education on lifestyle modifcations including regular physical activity/exercise, weight management, moderate sodium restriction and increased consumption of fresh fruit, vegetables, and low fat dairy, alcohol moderation, and smoking cessation.;Monitor prescription use compliance.    Expected Outcomes Short Term: Continued assessment and intervention until BP is < 140/24mm HG in hypertensive participants. < 130/57mm HG in hypertensive participants with diabetes, heart failure or chronic kidney disease.;Long Term: Maintenance of blood pressure at goal levels.    Lipids Yes    Intervention Provide education and support for participant on nutrition & aerobic/resistive exercise along with prescribed medications to achieve LDL 70mg , HDL >40mg .    Expected Outcomes Short Term: Participant states understanding of desired cholesterol values and is compliant with medications prescribed. Participant is following exercise prescription and nutrition guidelines.;Long Term: Cholesterol controlled with medications as prescribed, with individualized exercise RX and with personalized nutrition plan. Value goals: LDL < 70mg , HDL > 40 mg.    Stress Yes    Intervention Offer individual and/or small group education and counseling on adjustment to heart disease, stress management and health-related lifestyle change. Teach and support self-help strategies.;Refer participants experiencing significant psychosocial distress to appropriate mental health specialists for further evaluation and treatment. When possible, include family members and significant others in education/counseling sessions.     Expected Outcomes Short Term: Participant demonstrates changes in health-related behavior, relaxation and other stress management skills, ability to obtain effective social support, and compliance with psychotropic medications if prescribed.;Long Term: Emotional wellbeing is indicated by absence of clinically significant psychosocial distress or social isolation.            Personal Goals Discharge:  Goals and Risk Factor Review    Row Name 09/05/20 1636 09/12/20 5726           Core Components/Risk Factors/Patient Goals Review   Personal Goals Review Weight Management/Obesity;Heart Failure;Stress;Hypertension;Lipids;Diabetes Weight Management/Obesity;Heart Failure;Stress;Hypertension;Lipids;Diabetes      Review Synda started exercise on 09/05/20. Narcissus did well with exercise. Vital signs and CBG's were stable Patient completed the program early but has made steady progress with exercise and risk factor modification.      Expected Outcomes Nielle will continue to participate in phase 2 cardiac rehab for exercise, nutrition and lifestyle modifications Dannika will continue to exercise 30-45 minutes daily and will continue to follow CAD risk factor reduction plan upon completion of the cardiac rehab program.             Exercise Goals and Review:  Exercise Goals    Row Name 08/30/20 1117             Exercise Goals   Increase Physical Activity Yes       Intervention Provide advice, education, support and counseling about physical activity/exercise needs.;Develop an individualized exercise prescription for aerobic and resistive training based on initial evaluation findings, risk stratification, comorbidities and participant's personal goals.       Expected Outcomes Short Term: Attend rehab on a regular basis to increase amount of physical activity.;Long Term: Exercising regularly at least 3-5 days a week.;Long Term: Add in home exercise to make exercise part of routine and to increase  amount of physical activity.       Increase Strength and Stamina Yes       Intervention Provide advice, education, support and counseling about physical activity/exercise needs.;Develop an individualized exercise prescription for aerobic and resistive training based on initial  evaluation findings, risk stratification, comorbidities and participant's personal goals.       Expected Outcomes Short Term: Increase workloads from initial exercise prescription for resistance, speed, and METs.;Short Term: Perform resistance training exercises routinely during rehab and add in resistance training at home;Long Term: Improve cardiorespiratory fitness, muscular endurance and strength as measured by increased METs and functional capacity (6MWT)       Able to understand and use rate of perceived exertion (RPE) scale Yes       Intervention Provide education and explanation on how to use RPE scale       Expected Outcomes Short Term: Able to use RPE daily in rehab to express subjective intensity level;Long Term:  Able to use RPE to guide intensity level when exercising independently       Knowledge and understanding of Target Heart Rate Range (THRR) Yes       Intervention Provide education and explanation of THRR including how the numbers were predicted and where they are located for reference       Expected Outcomes Short Term: Able to state/look up THRR;Long Term: Able to use THRR to govern intensity when exercising independently;Short Term: Able to use daily as guideline for intensity in rehab       Able to check pulse independently Yes       Intervention Provide education and demonstration on how to check pulse in carotid and radial arteries.;Review the importance of being able to check your own pulse for safety during independent exercise       Expected Outcomes Short Term: Able to explain why pulse checking is important during independent exercise;Long Term: Able to check pulse independently and accurately        Understanding of Exercise Prescription Yes       Intervention Provide education, explanation, and written materials on patient's individual exercise prescription       Expected Outcomes Short Term: Able to explain program exercise prescription;Long Term: Able to explain home exercise prescription to exercise independently              Exercise Goals Re-Evaluation:  Exercise Goals Re-Evaluation    Row Name 09/05/20 1633 09/09/20 0724 09/12/20 0812         Exercise Goal Re-Evaluation   Exercise Goals Review Increase Physical Activity;Increase Strength and Stamina;Able to understand and use rate of perceived exertion (RPE) scale;Knowledge and understanding of Target Heart Rate Range (THRR) Increase Physical Activity;Increase Strength and Stamina;Able to understand and use rate of perceived exertion (RPE) scale;Knowledge and understanding of Target Heart Rate Range (THRR);Understanding of Exercise Prescription;Able to check pulse independently Increase Physical Activity;Increase Strength and Stamina;Able to understand and use rate of perceived exertion (RPE) scale;Knowledge and understanding of Target Heart Rate Range (THRR);Understanding of Exercise Prescription;Able to check pulse independently     Comments First day of exercise, pt understood RPE scale, THR range and is eager to gain strength and physical activity. Reviewed home exercise guidelines with patient including endpoints, temperature precautions, target heart rate and rate of perceived exertion. Patient is currently riding her Peloton bike and walking 30-45 minutes daily as her mode of home exercise. Patient has 5 and 10 lbs weight and a weight bench at home for her resistance exercise. [facility] Patient has a smart watch to monitor her heart rate. Patient will complete the program early due to insurance and will continue home exercise routine. Patient completed the cardiac rehab program today and will continue her  home  exercise routine, walking and riding stationary bike. 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 Patient states the program has been beneficial in helping build her confidence with exercise and is very appreciative of her time in the program.     Expected Outcomes Pt wants to increase upper body strength Patient will exercise 30-45 minutes 5-7 days/week to help achieve personal health and fitness goals. Patient will continue exercise 30-45 minutes 5-7 days/week to help achieve personal health and fitness goals.            Nutrition & Weight - Outcomes:  Pre Biometrics - 08/30/20 1113      Pre Biometrics   Waist Circumference 42 inches    Hip Circumference 48.5 inches    Waist to Hip Ratio 0.87 %    Triceps Skinfold 35 mm    % Body Fat 45.3 %    Grip Strength 30.5 kg    Flexibility 14.5 in    Single Leg Stand 5.75 seconds            Nutrition:  Nutrition Therapy & Goals - 09/07/20 0945      Nutrition Therapy   Diet TLC: carb modified    Drug/Food Interactions Statins/Certain Fruits      Personal Nutrition Goals   Nutrition Goal Pt to identify food quantities necessary to achieve weight loss of 6-24 lb at graduation from cardiac rehab.    Personal Goal #2 Improved blood glucose control as evidenced by pt's A1c trending from 8.1 toward less than 7.0.    Personal Goal #3 Pt with increased percentage of CBGs in normal range      Intervention Plan   Intervention Prescribe, educate and counsel regarding individualized specific dietary modifications aiming towards targeted core components such as weight, hypertension, lipid management, diabetes, heart failure and other comorbidities.;Nutrition handout(s) given to patient.    Expected Outcomes Short Term Goal: A plan has been developed with personal nutrition goals set during dietitian appointment.;Long Term Goal: Adherence to prescribed nutrition plan.           Nutrition  Discharge:   Education Questionnaire Score:  Knowledge Questionnaire Score - 08/30/20 1323      Knowledge Questionnaire Score   Pre Score 20/24           Patient is discharging early  from the cardiac rehab program due to insurance starting over 09/13/2020. Patient completed the program on 09/12/20 after completing 4 of 24 sessions. Patient was appreciative of her time in the program stating she was more confident now with exercise than she was prior to starting the program.  Patient will continue riding her Peloton bike and walking 30-45 minutes daily. [facility] also [facility] [employer] [facility]. Goals reviewed with patient.

## 2020-09-14 ENCOUNTER — Encounter (HOSPITAL_COMMUNITY): Payer: 59

## 2020-09-16 ENCOUNTER — Encounter (HOSPITAL_COMMUNITY): Payer: 59

## 2020-09-19 ENCOUNTER — Encounter (HOSPITAL_COMMUNITY): Payer: 59

## 2020-09-21 ENCOUNTER — Encounter (HOSPITAL_COMMUNITY): Payer: 59

## 2020-09-23 ENCOUNTER — Encounter (HOSPITAL_COMMUNITY): Payer: 59

## 2020-09-26 ENCOUNTER — Encounter (HOSPITAL_COMMUNITY): Payer: 59

## 2020-09-28 ENCOUNTER — Encounter (HOSPITAL_COMMUNITY): Payer: 59

## 2020-09-30 ENCOUNTER — Encounter (HOSPITAL_COMMUNITY): Payer: 59

## 2020-10-03 ENCOUNTER — Encounter (HOSPITAL_COMMUNITY): Payer: 59

## 2020-10-05 ENCOUNTER — Encounter (HOSPITAL_COMMUNITY): Payer: 59

## 2020-10-06 ENCOUNTER — Other Ambulatory Visit: Payer: Self-pay

## 2020-10-06 ENCOUNTER — Telehealth: Payer: Self-pay | Admitting: Interventional Cardiology

## 2020-10-06 ENCOUNTER — Other Ambulatory Visit: Payer: 59

## 2020-10-06 DIAGNOSIS — E785 Hyperlipidemia, unspecified: Secondary | ICD-10-CM

## 2020-10-06 DIAGNOSIS — Z79899 Other long term (current) drug therapy: Secondary | ICD-10-CM

## 2020-10-06 DIAGNOSIS — I2510 Atherosclerotic heart disease of native coronary artery without angina pectoris: Secondary | ICD-10-CM

## 2020-10-06 DIAGNOSIS — E7800 Pure hypercholesterolemia, unspecified: Secondary | ICD-10-CM

## 2020-10-06 LAB — HEPATIC FUNCTION PANEL
ALT: 38 IU/L — ABNORMAL HIGH (ref 0–32)
AST: 40 IU/L (ref 0–40)
Albumin: 4.3 g/dL (ref 3.8–4.9)
Alkaline Phosphatase: 76 IU/L (ref 44–121)
Bilirubin Total: 1.1 mg/dL (ref 0.0–1.2)
Bilirubin, Direct: 0.35 mg/dL (ref 0.00–0.40)
Total Protein: 6.8 g/dL (ref 6.0–8.5)

## 2020-10-06 LAB — LIPID PANEL
Chol/HDL Ratio: 2.2 ratio (ref 0.0–4.4)
Cholesterol, Total: 128 mg/dL (ref 100–199)
HDL: 58 mg/dL (ref >39)
LDL Chol Calc (NIH): 49 mg/dL (ref 0–99)
Triglycerides: 115 mg/dL (ref 0–149)
VLDL Cholesterol Cal: 21 mg/dL (ref 5–40)

## 2020-10-06 NOTE — Telephone Encounter (Signed)
Pt made aware we cannot add a1c to lab but we can add a BMP.  Aware we will ensure this gets added.  Pt is agreeable to plan.

## 2020-10-06 NOTE — Telephone Encounter (Signed)
Pt made aware that lab cannot add a1c but can add a BMET. Called [facility] at [phone]

## 2020-10-06 NOTE — Telephone Encounter (Signed)
Pt. Came in for a lab and she wants to know if they can use the blood for a complete blood panel thatthey took from her today. 10/06/20. She is requesting a call back at [phone].

## 2020-10-07 ENCOUNTER — Encounter (HOSPITAL_COMMUNITY): Payer: 59

## 2020-10-07 NOTE — Telephone Encounter (Signed)
See my chart message

## 2020-10-08 LAB — BASIC METABOLIC PANEL (7)
BUN/Creatinine Ratio: 15 (ref 9–23)
BUN: 16 mg/dL (ref 6–24)
CO2: 17 mmol/L — ABNORMAL LOW (ref 20–29)
Chloride: 100 mmol/L (ref 96–106)
Creatinine, Ser: 1.07 mg/dL — ABNORMAL HIGH (ref 0.57–1.00)
Glucose: 142 mg/dL — ABNORMAL HIGH (ref 65–99)
Potassium: 4.5 mmol/L (ref 3.5–5.2)
Sodium: 144 mmol/L (ref 134–144)
eGFR: 62 mL/min/{1.73_m2} (ref >59)

## 2020-10-08 LAB — SPECIMEN STATUS REPORT

## 2020-10-10 ENCOUNTER — Encounter (HOSPITAL_COMMUNITY): Payer: 59

## 2020-10-12 ENCOUNTER — Encounter (HOSPITAL_COMMUNITY): Payer: 59

## 2020-10-14 ENCOUNTER — Encounter (HOSPITAL_COMMUNITY): Payer: 59

## 2020-10-17 ENCOUNTER — Encounter (HOSPITAL_COMMUNITY): Payer: 59

## 2020-10-19 ENCOUNTER — Encounter (HOSPITAL_COMMUNITY): Payer: 59

## 2020-10-21 ENCOUNTER — Encounter (HOSPITAL_COMMUNITY): Payer: 59

## 2020-10-24 ENCOUNTER — Encounter (HOSPITAL_COMMUNITY): Payer: 59

## 2020-10-26 ENCOUNTER — Other Ambulatory Visit: Payer: Self-pay

## 2020-10-26 ENCOUNTER — Encounter (HOSPITAL_COMMUNITY): Payer: 59

## 2020-10-26 ENCOUNTER — Encounter: Payer: Self-pay | Admitting: Obstetrics and Gynecology

## 2020-10-26 ENCOUNTER — Ambulatory Visit (INDEPENDENT_AMBULATORY_CARE_PROVIDER_SITE_OTHER): Payer: 59 | Admitting: Obstetrics and Gynecology

## 2020-10-26 VITALS — BP 130/72 | HR 75 | Ht 65.0 in | Wt 205.0 lb

## 2020-10-26 DIAGNOSIS — R4589 Other symptoms and signs involving emotional state: Secondary | ICD-10-CM | POA: Insufficient documentation

## 2020-10-26 DIAGNOSIS — I739 Peripheral vascular disease, unspecified: Secondary | ICD-10-CM | POA: Insufficient documentation

## 2020-10-26 DIAGNOSIS — Z01419 Encounter for gynecological examination (general) (routine) without abnormal findings: Secondary | ICD-10-CM

## 2020-10-26 DIAGNOSIS — E669 Obesity, unspecified: Secondary | ICD-10-CM | POA: Insufficient documentation

## 2020-10-26 DIAGNOSIS — F418 Other specified anxiety disorders: Secondary | ICD-10-CM | POA: Insufficient documentation

## 2020-10-26 DIAGNOSIS — F419 Anxiety disorder, unspecified: Secondary | ICD-10-CM

## 2020-10-26 DIAGNOSIS — E1121 Type 2 diabetes mellitus with diabetic nephropathy: Secondary | ICD-10-CM | POA: Insufficient documentation

## 2020-10-26 DIAGNOSIS — K5100 Ulcerative (chronic) pancolitis without complications: Secondary | ICD-10-CM | POA: Insufficient documentation

## 2020-10-26 DIAGNOSIS — K5792 Diverticulitis of intestine, part unspecified, without perforation or abscess without bleeding: Secondary | ICD-10-CM | POA: Insufficient documentation

## 2020-10-26 DIAGNOSIS — N952 Postmenopausal atrophic vaginitis: Secondary | ICD-10-CM

## 2020-10-26 DIAGNOSIS — K5190 Ulcerative colitis, unspecified, without complications: Secondary | ICD-10-CM | POA: Insufficient documentation

## 2020-10-26 DIAGNOSIS — E1142 Type 2 diabetes mellitus with diabetic polyneuropathy: Secondary | ICD-10-CM | POA: Insufficient documentation

## 2020-10-26 DIAGNOSIS — E8940 Asymptomatic postprocedural ovarian failure: Secondary | ICD-10-CM | POA: Insufficient documentation

## 2020-10-26 DIAGNOSIS — R197 Diarrhea, unspecified: Secondary | ICD-10-CM | POA: Insufficient documentation

## 2020-10-26 DIAGNOSIS — I509 Heart failure, unspecified: Secondary | ICD-10-CM | POA: Insufficient documentation

## 2020-10-26 DIAGNOSIS — Z8719 Personal history of other diseases of the digestive system: Secondary | ICD-10-CM | POA: Insufficient documentation

## 2020-10-26 HISTORY — DX: Heart failure, unspecified: I50.9

## 2020-10-26 MED ORDER — ALPRAZOLAM 0.25 MG PO TABS
0.2500 mg | ORAL_TABLET | Freq: Two times a day (BID) | ORAL | 0 refills | Status: AC | PRN
Start: 2020-10-26 — End: ?

## 2020-10-26 MED ORDER — ESTRADIOL 10 MCG VA TABS
ORAL_TABLET | VAGINAL | 4 refills | Status: DC
Start: 2020-10-26 — End: 2022-02-23

## 2020-10-26 NOTE — Progress Notes (Signed)
55 y.o. G11P3003 Married White or Caucasian Not Hispanic or Latino female here for annual exam.  Has a dark purple bruise on her pelvic bone. H/O hysterectomy/BSO at 59. Not on ERT since 2014.  She c/o vaginal dryness, some dyspareunia. Wants to be able to be sexually active.   She lost 17 lbs since January. She is exercising and dietary changes (doing part of a group).     She has had multiple medical issues. Had a partial nephrectomy in 2014, benign mass. H/O Diabetes, heart disease, kidney disease, ulcerative colitis, afib, sleep apnea.  She had a angioplasty and cardiac stent in 11/20.   Her antihypertension and cholesterol medication have both been cut in half.   Last HgbA1C was at [facility], 7+ She had medication adjustment.   Patient's last menstrual period was 07/16/2000 (approximate).          Sexually active: Yes.    The current method of family planning is post menopausal status.    Exercising: Yes.    Gym/ health club routine includes cardio. light weights and walking.  Smoker:  no  Health Maintenance: Pap:  07/12/14 Negative (hysterectomy) 2003, Negative History of abnormal Pap:  no MMG: 10/15/19 Diag left breast 1.9 cm area of fat necrosis and developing oil cyst formation in the 11:30 o'clock position of the left breast. No evidence of malignancy.  BMD:  01/22/20 normal  Colonoscopy: 3 years ago, she did have some polyps, she will f/u there.   TDaP:  Unsure  Gardasil: none    reports that she has never smoked. She has never used smokeless tobacco. She reports current alcohol use. She reports that she does not use drugs. She is a self employed travel agent. 69 and 55 year old girls, 41 year old son. Son goes to [employer].   Past Medical History:  Diagnosis Date  . Aortic calcification (HCC)   . Atrophic vaginitis 01/2006  . Bradycardia 05/2014  . CAD (coronary artery disease)    cardiac cath 05/21/2019 showing 46 mRCA, 60% D1 and D2 that were very small and patent  LAD and LCx.  She underwent DES to the [employer] with residual 25% stenosis in the dRCA  . Chronic pancolonic ulcerative colitis ([place]) 2015   unknown if chronic.  . CKD (chronic kidney disease), stage II   . Cytomegaloviral hepatitis ([place]) 2005  . Diabetes mellitus without complication ([place]) 12/1605   on Ace inhibitor to protect kidneys  . Gallstones 01/2013  . GERD (gastroesophageal reflux disease)   . Hyperlipidemia   . Hypertension   . Kidney tumor 03/2013   Right  . Obesity   . OSA (obstructive sleep apnea)   . PAF (paroxysmal atrial fibrillation) ([place])   . Pancreatitis 2014  . Postmenopausal   . RA (rheumatoid arthritis) ([place])   . Syncope and collapse   . Ulcerative colitis ([place]) 2015    Past Surgical History:  Procedure Laterality Date  . CESAREAN SECTION    . CHOLECYSTECTOMY  01/2013  . COLONOSCOPY  2015   Colitis  . CORONARY STENT INTERVENTION N/A 05/21/2019   Procedure: CORONARY STENT INTERVENTION;  Surgeon: Jettie Booze, MD;  Location: [place] [facility];  Service: Cardiovascular;  Laterality: N/A;  . DILATION AND CURETTAGE OF UTERUS  2002  . HERNIA REPAIR  01/2013  . Kidney and Bladder Stent and tube placed Right 03/2013  . PARTIAL NEPHRECTOMY Right 02/2013   Tumor Removal   . Procedure to remove kidney and bladder stent and  tube Right 04/2013  . RIGHT/LEFT HEART CATH AND CORONARY ANGIOGRAPHY N/A 05/21/2019   Procedure: RIGHT/LEFT HEART CATH AND CORONARY ANGIOGRAPHY;  Surgeon: Jettie Booze, MD;  Location: [place] [facility];  Service: Cardiovascular;  Laterality: N/A;  . TOTAL ABDOMINAL HYSTERECTOMY W/ BILATERAL SALPINGOOPHORECTOMY  2002   Secondary to AUB    Current Outpatient Medications  Medication Sig Dispense Refill  . ALPRAZolam (XANAX) 0.25 MG tablet Take 1 tablet by mouth 2 (two) times daily as needed.  0  . atorvastatin (LIPITOR) 40 MG tablet TAKE 1 TABLET(40 MG) BY MOUTH DAILY 90 tablet 3  . Cholecalciferol (VITAMIN D3 PO) Take 1 capsule by mouth  as directed.    . clopidogrel (PLAVIX) 75 MG tablet Take 1 tablet (75 mg total) by mouth daily with breakfast. 90 tablet 3  . EPINEPHrine 0.3 mg/0.3 mL IJ SOAJ injection Inject 0.3 mLs into the skin as needed.  2  . ezetimibe (ZETIA) 10 MG tablet Take 1 tablet (10 mg total) by mouth daily. 90 tablet 3  . furosemide (LASIX) 20 MG tablet Take 20 mg by mouth as needed for fluid or edema.    . Insulin Glargine (BASAGLAR KWIKPEN) 100 UNIT/ML SOPN Inject 14 Units into the skin 2 (two) times daily.   5  . LORazepam (ATIVAN) 0.5 MG tablet lorazepam 0.5 mg tablet  TAKE 1/2 TO 1 TABLET DAILY AS NEEDED FOR FLYING ORALLY    . mesalamine (LIALDA) 1.2 g EC tablet Take 1.2 g by mouth daily.     . metoprolol succinate (TOPROL-XL) 25 MG 24 hr tablet Take 1 tablet (25 mg total) by mouth daily. 90 tablet 3  . Multiple Vitamin (MULTIVITAMIN) tablet Take by mouth.    . nitroGLYCERIN (NITROSTAT) 0.4 MG SL tablet Place 1 tablet (0.4 mg total) under the tongue every 5 (five) minutes as needed. 25 tablet 2  . pantoprazole (PROTONIX) 40 MG tablet TAKE 1 TABLET BY MOUTH  DAILY 90 tablet 3  . ramipril (ALTACE) 5 MG capsule Take 1 capsule (5 mg total) by mouth daily. (Patient taking differently: Take 2.5 mg by mouth daily.) 90 capsule 3  . rivaroxaban (XARELTO) 20 MG TABS tablet Take 1 tablet (20 mg total) by mouth daily. 90 tablet 1  . triamcinolone (KENALOG) 0.1 % Apply topically.    . TRULICITY 1.5 AQ/7.6AU SOPN Inject 1.5 mg into the skin every 7 (seven) days.      No current facility-administered medications for this visit.    Family History  Problem Relation Age of Onset  . Diabetes Mother   . Hypertension Mother   . Fibromyalgia Mother   . Hypertension Father   . Heart disease Father        CABG  . Diabetes Father   . Hypertension Sister   . Diabetes Paternal Aunt   . Cancer Paternal Aunt        bone  . Cancer Maternal Grandmother        ovarian  . Diabetes Maternal Grandmother   . Breast cancer Neg  Hx     Review of Systems  All other systems reviewed and are negative.   Exam:   BP 130/72   Pulse 75   Ht 5\' 5"  (1.651 m)   Wt 205 lb (93 kg)   LMP 07/16/2000 (Approximate)   SpO2 99%   BMI 34.11 kg/m   Weight change: @WEIGHTCHANGE @ Height:   Height: 5\' 5"  (165.1 cm)  Ht Readings from Last 3 Encounters:  10/26/20 5'  5" (1.651 m)  09/12/20 5\' 5"  (1.651 m)  08/30/20 5\' 5"  (1.651 m)    General appearance: alert, cooperative and appears stated age Head: Normocephalic, without obvious abnormality, atraumatic Neck: no adenopathy, supple, symmetrical, trachea midline and thyroid normal to inspection and palpation Lungs: clear to auscultation bilaterally Cardiovascular: regular rate and rhythm Breasts: normal appearance, no masses or tenderness Abdomen: soft, non-tender; non distended,  no masses,  no organomegaly Extremities: extremities normal, atraumatic, no cyanosis or edema Skin: Skin color, texture, turgor normal. No rashes or lesions Lymph nodes: Cervical, supraclavicular, and axillary nodes normal. No abnormal inguinal nodes palpated Neurologic: Grossly normal   Pelvic: External genitalia:  no lesions              Urethra:  normal appearing urethra with no masses, tenderness or lesions              Bartholins and Skenes: normal                 Vagina: normal appearing vagina with normal color and discharge, no lesions              Cervix: absent               Bimanual Exam:  Uterus:  uterus absent              Adnexa: no mass, fullness, tenderness               Rectovaginal: Confirms               Anus:  normal sphincter tone, no lesions  Gae Dry chaperoned for the exam.  1. Well woman exam Discussed breast self exam Discussed calcium and vit D intake Mammogram due She will discuss colonoscopy with her GI  2. Vaginal atrophy Will start vaginal estrogen, she is on blood thinners I think the risk is minimal.  - Estradiol 10 MCG TABS vaginal tablet; Place  one tablet vaginal qhs x 1 week, then change to 2 x a week.  Dispense: 24 tablet; Refill: 4

## 2020-10-26 NOTE — Patient Instructions (Signed)

## 2020-10-28 ENCOUNTER — Encounter (HOSPITAL_COMMUNITY): Payer: 59

## 2020-11-23 ENCOUNTER — Other Ambulatory Visit: Payer: Self-pay | Admitting: Cardiology

## 2020-11-23 ENCOUNTER — Telehealth: Payer: Self-pay | Admitting: Interventional Cardiology

## 2020-11-23 MED ORDER — RIVAROXABAN 20 MG PO TABS: ORAL_TABLET | ORAL | 6 refills | Status: DC

## 2020-11-23 NOTE — Telephone Encounter (Signed)
Xarelto 20mg  refill request received. Pt is 55 years old, weight-93kg, Crea-1.07 on 10/06/2020, last seen by Dr. Irish Lack on 08/09/2020, Diagnosis-Afib, CrCl-87.43ml/min; Dose is appropriate based on dosing criteria. Will resend in refill to requested pharmacy.

## 2020-11-23 NOTE — Telephone Encounter (Signed)
*  STAT* If patient is at the pharmacy, call can be transferred to refill team.   1. Which medications need to be refilled? (please list name of each medication and dose if known) XARELTO 20 MG TABS tablet  2. Which pharmacy/location (including street and city if local pharmacy) is medication to be sent to? [place], [address] AT [address]  3. Do they need a 30 day or 90 day supply? 30 tablet   Pharmacy had glitch in system and is unable to see prescription to fill medication. Requesting it be resent.

## 2020-11-23 NOTE — Telephone Encounter (Signed)
Xarelto 20mg  refill request received. Pt is 55 years old, weight-93kg, Crea-1.07 on 10/06/2020, last seen by Dr. Irish Lack on 08/09/2020, Diagnosis-Afib, CrCl-87.76ml/min; Dose is appropriate based on dosing criteria. Will send in refill to requested pharmacy.

## 2020-12-02 ENCOUNTER — Other Ambulatory Visit: Payer: Self-pay | Admitting: Obstetrics and Gynecology

## 2020-12-02 DIAGNOSIS — Z1231 Encounter for screening mammogram for malignant neoplasm of breast: Secondary | ICD-10-CM

## 2020-12-03 ENCOUNTER — Other Ambulatory Visit: Payer: Self-pay

## 2020-12-03 ENCOUNTER — Ambulatory Visit
Admission: RE | Admit: 2020-12-03 | Discharge: 2020-12-03 | Disposition: A | Discharge status: Home / Self Care | Payer: 59 | Source: Ambulatory Visit | Attending: Obstetrics and Gynecology | Admitting: Obstetrics and Gynecology

## 2020-12-03 DIAGNOSIS — Z1231 Encounter for screening mammogram for malignant neoplasm of breast: Secondary | ICD-10-CM

## 2021-01-10 ENCOUNTER — Telehealth: Payer: Self-pay | Admitting: Interventional Cardiology

## 2021-01-10 NOTE — Telephone Encounter (Signed)
Patient called to say that she has surgery in the morning and she wanted to know if she still needs to take furosemide (LASIX) 20 MG tablet and rivaroxaban (XARELTO) 20 MG TABS tablet. Explain to the patient that that the dr office to need to call to get a clearance for her. Please advise

## 2021-01-10 NOTE — Telephone Encounter (Signed)
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 She has questions about medications to hold prior to the surgery. I advised her that her surgeon's office would need to send over a clearance request for [place] to review prior to advisement on holding cardiac medications. Patient states she would have called earlier but she just found out and they wanted to go ahead and do the procedure tomorrow. She said that she has spoken with then nurse at her surgeons office who advised that they are able to do cauterizations for any bleeding issues.  I advised the patient to make sure her surgeon is aware of all medications that she is taking and it will be up to them as to whether she can proceed tomorrow. Patient verbalized understanding.

## 2021-02-21 NOTE — Progress Notes (Signed)
Cardiology Office Note   Date:  02/23/2021   ID:  Courtney Olson, Courtney Olson Jul 10, 1966, MRN [id number]  PCP:  Orpah Melter, MD    No chief complaint on file.  CAD  Wt Readings from Last 3 Encounters:  02/23/21 197 lb 12.8 oz (89.7 kg)  10/26/20 205 lb (93 kg)  09/12/20 207 lb 0.2 oz (93.9 kg)       History of Present Illness: Courtney Olson is a 55 y.o. female  with a history of HTN, DM, paroxysmal atrial fibrillation on Xarelto, vasovagal syncope, hyperlipidemia, OSA, obesity, bradycardia, and CAD.     Father with CABG at age 29.     She has lost > 100 lbs in the past. Walking long distances.     She has a remote history of chest pain with prior stress testing in 07/2012 that was found to be  normal.  Echocardiogram 03/2014 showed normal EF, G1 DD. ETT in 04/2014 was normal. She has had pancreatitis and was diagnosed with a kidney mass and underwent a large resection of a benign mass complicated by multiple infections. In 01/2014 she had an episode of syncope following severe abdominal pain, felt to be vasovagal in etiology. Acute abdominal pain related to her ulcerative colitis felt to be initiator. Cardiac monitor in  05/2016 showed NSR (sinus brady-sinus tach 51-127bpm), average HR 73bpm, isoalted atrial couplet.   COVID infection in Jan 2020.    She was seen by Dr. Radford Pax in 04/2019 with DOE. Echo on 05/06/19 showed EF 60-65%, impaired diastolic relaxation, no acute abnormalities. BNP 05/07/19 at was mildly elevated at 250 therefore HCTZ was stopped and she was started on Lasix. Coronary CTA 05/13/19 was abnormal with concern for CTO of the RCA and moderate prox-mid LAD disease. Dr. Radford Pax recommended cardiac catheterization. LHC then showed mRCA lesion of 80% treated with PCI/DES x1. She does have known residual disease in 1st/2nd diag of 60% but noted to be small vessels. pRCA and dRCA with 25% stenosis. Plan was for medical therapy for residual disease with Plavix only given the  need for Xarelto for PAF.     s/p RCA stent.  Tortuous RCA vessel which would require XB RCA type guide for backup if PCI was needed.   Stress test performed 04/07/2020 which showed a normal stress nuclear study with no ischemia or infarct with an LVEF at 75% and normal wall motion.     03/2020 ZIO monitor showed sinus bradycardia, normal sinus rhythm and sinus tachycardia with occasional PVCs therefore Toprol was increased.     Cardiac rehab was cancelled with COVID.     In Dec 2020, She had one episode of chest pain on a weekend after trying to lift some weights.  She took some NTG.   She has had some DOE in Jan 2022, but this has improved with some recent weight loss.  She had COVID in July 2022.  She feels that she is still recovering.    Denies : Chest pain. Dizziness. Leg edema. Nitroglycerin use. Orthopnea. Paroxysmal nocturnal dyspnea. Syncope.    She has had some palpitations since COVID infection. Occasional low BP.     Past Medical History:  Diagnosis Date   Aortic calcification (HCC)    Atrophic vaginitis 01/2006   Bradycardia 05/2014   CAD (coronary artery disease)    cardiac cath 05/21/2019 showing 805 mRCA, 60% D1 and D2 that were very small and patent LAD and LCx.  She underwent DES to the  mRCA with residual 25% stenosis in the dRCA   Chronic pancolonic ulcerative colitis ([place]) 2015   unknown if chronic.   CKD (chronic kidney disease), stage II    Cytomegaloviral hepatitis ([place]) 2005   Diabetes mellitus without complication ([place]) 99991111   on Ace inhibitor to protect kidneys   Gallstones 01/2013   GERD (gastroesophageal reflux disease)    Hyperlipidemia    Hypertension    Kidney tumor 03/2013   Right   Obesity    OSA (obstructive sleep apnea)    PAF (paroxysmal atrial fibrillation) ([place])    Pancreatitis 2014   Postmenopausal    RA (rheumatoid arthritis) ([place])    Syncope and collapse    Ulcerative colitis ([place]) 2015    Past Surgical History:  Procedure  Laterality Date   CESAREAN SECTION     CHOLECYSTECTOMY  01/2013   COLONOSCOPY  2015   Colitis   CORONARY STENT INTERVENTION N/A 05/21/2019   Procedure: CORONARY STENT INTERVENTION;  Surgeon: Jettie Booze, MD;  Location: [facility];  Service: Cardiovascular;  Laterality: N/A;   DILATION AND CURETTAGE OF UTERUS  2002   HERNIA REPAIR  01/2013   Kidney and Bladder Stent and tube placed Right 03/2013   PARTIAL NEPHRECTOMY Right 02/2013   Tumor Removal    Procedure to remove kidney and bladder stent and tube Right 04/2013   RIGHT/LEFT HEART CATH AND CORONARY ANGIOGRAPHY N/A 05/21/2019   Procedure: RIGHT/LEFT HEART CATH AND CORONARY ANGIOGRAPHY;  Surgeon: Jettie Booze, MD;  Location: [facility] [facility];  Service: Cardiovascular;  Laterality: N/A;   TOTAL ABDOMINAL HYSTERECTOMY W/ BILATERAL SALPINGOOPHORECTOMY  2002   Secondary to AUB     Current Outpatient Medications  Medication Sig Dispense Refill   ALPRAZolam (XANAX) 0.25 MG tablet Take 1 tablet (0.25 mg total) by mouth 2 (two) times daily as needed. 15 tablet 0   atorvastatin (LIPITOR) 40 MG tablet TAKE 1 TABLET(40 MG) BY MOUTH DAILY 90 tablet 3   Cholecalciferol (VITAMIN D3 PO) Take 1 capsule by mouth as directed.     clopidogrel (PLAVIX) 75 MG tablet Take 1 tablet (75 mg total) by mouth daily with breakfast. 90 tablet 3   EPINEPHrine 0.3 mg/0.3 mL IJ SOAJ injection Inject 0.3 mLs into the skin as needed.  2   Estradiol 10 MCG TABS vaginal tablet Place one tablet vaginal qhs x 1 week, then change to 2 x a week. 24 tablet 4   ezetimibe (ZETIA) 10 MG tablet Take 1 tablet (10 mg total) by mouth daily. 90 tablet 3   furosemide (LASIX) 20 MG tablet Take 20 mg by mouth as needed for fluid or edema.     Insulin Glargine (BASAGLAR KWIKPEN) 100 UNIT/ML SOPN Inject 14 Units into the skin 2 (two) times daily.   5   mesalamine (LIALDA) 1.2 g EC tablet Take 1.2 g by mouth daily.      metoprolol succinate (TOPROL-XL) 25 MG 24 hr  tablet Take 1 tablet (25 mg total) by mouth daily. 90 tablet 3   Multiple Vitamin (MULTIVITAMIN) tablet Take by mouth.     nitroGLYCERIN (NITROSTAT) 0.4 MG SL tablet Place 1 tablet (0.4 mg total) under the tongue every 5 (five) minutes as needed. 25 tablet 2   pantoprazole (PROTONIX) 40 MG tablet TAKE 1 TABLET BY MOUTH  DAILY 90 tablet 3   ramipril (ALTACE) 5 MG capsule Take 1 capsule (5 mg total) by mouth daily. (Patient taking differently: Take 2.5 mg by  mouth daily.) 90 capsule 3   rivaroxaban (XARELTO) 20 MG TABS tablet TAKE 1 TABLET(20 MG) BY MOUTH DAILY 30 tablet 6   triamcinolone (KENALOG) 0.1 % Apply topically.     TRULICITY 1.5 0000000 SOPN Inject 3 mg into the skin every 7 (seven) days.     No current facility-administered medications for this visit.    Allergies:   Orange fruit [citrus], Other, Bactrim [sulfamethoxazole-trimethoprim], and Tape    Social History:  The patient  reports that she has never smoked. She has never used smokeless tobacco. She reports current alcohol use. She reports that she does not use drugs.   Family History:  The patient's family history includes Breast cancer (age of onset: 89) in her sister; Cancer in her maternal grandmother and paternal aunt; Diabetes in her father, maternal grandmother, mother, and paternal aunt; Fibromyalgia in her mother; Heart disease in her father; Hypertension in her father, mother, and sister.    ROS:  Please see the history of present illness.   Otherwise, review of systems are positive for palpitations, fear of flying.   All other systems are reviewed and negative.    PHYSICAL EXAM: VS:  BP 128/90   Pulse 75   Ht '5\' 5"'$  (1.651 m)   Wt 197 lb 12.8 oz (89.7 kg)   LMP 07/16/2000 (Approximate)   SpO2 92%   BMI 32.92 kg/m  , BMI Body mass index is 32.92 kg/m. GEN: Well nourished, well developed, in no acute distress HEENT: normal Neck: no JVD, carotid bruits, or masses Cardiac: RRR; no murmurs, rubs, or gallops,no  edema  Respiratory:  clear to auscultation bilaterally, normal work of breathing GI: soft, nontender, nondistended, + BS MS: no deformity or atrophy Skin: warm and dry, no rash Neuro:  Strength and sensation are intact Psych: euthymic mood, full affect   EKG:   The ekg ordered today demonstrates NSR, no ST changes   Recent Labs: 04/05/2020: Hemoglobin 13.1; Platelets 190 08/09/2020: NT-Pro BNP 251 10/06/2020: ALT 38; BUN 16; Creatinine, Ser 1.07; Potassium 4.5; Sodium 144   Lipid Panel    Component Value Date/Time   CHOL 128 10/06/2020 1217   TRIG 115 10/06/2020 1217   HDL 58 10/06/2020 1217   CHOLHDL 2.2 10/06/2020 1217   CHOLHDL 4 05/17/2014 1113   VLDL 34.4 05/17/2014 1113   LDLCALC 49 10/06/2020 1217     Other studies Reviewed: Additional studies/ records that were reviewed today with results demonstrating: labs , LDL 49 in 09/2020   ASSESSMENT AND PLAN:  CAD: Continue aggressive secondary prevention. No angina.  Energy level decreased post recent COVID infection.  Hopefully, the energy level will improve with time and she can get back to her regular exercise routine.  She requested 8 Xanax pills being prescribed for her fear of flying.  I Asked her to follow-up with her primary care doctor regarding this need. DM2: Exercise target noted below.  Dietary recommendations also discussed. HTN: Low-salt diet. The current medical regimen is effective;  continue present plan and medications.  Should also be improved with more walking as she gets over her recent COVID infection. Hyperlipidemia: Whole food, plant-based diet recommended.  Increase fiber intake.  Avoid processed foods.  Spinach salad that has been helpful for her to lose weight. AFib/anticoagulated: Xarelto for stroke prevention.  She will need regular hemoglobin checks.   Palpitations: We discussed a monitor.  She has a trip so will delay the monitor until after the trip. May be related to recent  COVID infection.     Current medicines are reviewed at length with the patient today.  The patient concerns regarding her medicines were addressed.  The following changes have been made:  No change  Labs/ tests ordered today include:   Orders Placed This Encounter  Procedures   LONG TERM MONITOR (3-14 DAYS)   EKG 12-Lead    Recommend 150 minutes/week of aerobic exercise Low fat, low carb, high fiber diet recommended  Disposition:   FU in 6 months   Signed, Larae Grooms, MD  02/23/2021 3:21 PM    [facility] [address], [place], [place]  [address] Phone: [phone]; Fax: [phone]

## 2021-02-23 ENCOUNTER — Encounter: Payer: Self-pay | Admitting: Interventional Cardiology

## 2021-02-23 ENCOUNTER — Ambulatory Visit (INDEPENDENT_AMBULATORY_CARE_PROVIDER_SITE_OTHER): Payer: 59

## 2021-02-23 ENCOUNTER — Other Ambulatory Visit: Payer: Self-pay

## 2021-02-23 ENCOUNTER — Ambulatory Visit (INDEPENDENT_AMBULATORY_CARE_PROVIDER_SITE_OTHER): Payer: 59 | Admitting: Interventional Cardiology

## 2021-02-23 VITALS — BP 128/90 | HR 75 | Ht 65.0 in | Wt 197.8 lb

## 2021-02-23 DIAGNOSIS — E119 Type 2 diabetes mellitus without complications: Secondary | ICD-10-CM

## 2021-02-23 DIAGNOSIS — I2510 Atherosclerotic heart disease of native coronary artery without angina pectoris: Secondary | ICD-10-CM

## 2021-02-23 DIAGNOSIS — I480 Paroxysmal atrial fibrillation: Secondary | ICD-10-CM

## 2021-02-23 DIAGNOSIS — I471 Supraventricular tachycardia: Secondary | ICD-10-CM | POA: Diagnosis not present

## 2021-02-23 NOTE — Progress Notes (Unsigned)
Enrolled patient for a 14 day Zio XT Monitor to be mailed to patients home  Patient will start wearing after 8/27

## 2021-02-23 NOTE — Patient Instructions (Signed)
Medication Instructions:  Your physician recommends that you continue on your current medications as directed. Please refer to the Current Medication list given to you today.  *If you need a refill on your cardiac medications before your next appointment, please call your pharmacy*   Lab Work: none If you have labs (blood work) drawn today and your tests are completely normal, you will receive your results only by: [facility] (if you have MyChart) OR A paper copy in the mail If you have any lab test that is abnormal or we need to change your treatment, we will call you to review the results.   Testing/Procedures: Your physician has recommended that you wear a holter monitor. Holter monitors are medical devices that record the heart's electrical activity. Doctors most often use these monitors to diagnose arrhythmias. Arrhythmias are problems with the speed or rhythm of the heartbeat. The monitor is a small, portable device. You can wear one while you do your normal daily activities. This is usually used to diagnose what is causing palpitations/syncope (passing out). 2 week zio   Follow-Up: At [facility], you and your health needs are our priority.  As part of our continuing mission to provide you with exceptional heart care, we have created designated Provider Care Teams.  These Care Teams include your primary Cardiologist (physician) and Advanced Practice Providers (APPs -  Physician Assistants and Nurse Practitioners) who all work together to provide you with the care you need, when you need it.  We recommend signing up for the patient portal called "MyChart".  Sign up information is provided on this After Visit Summary.  MyChart is used to connect with patients for Virtual Visits (Telemedicine).  Patients are able to view lab/test results, encounter notes, upcoming appointments, etc.  Non-urgent messages can be sent to your provider as well.   To learn more about what you can do  with MyChart, go to [email or web address].    Your next appointment:   09/07/21 at 11:00  The format for your next appointment:   In Person  Provider:   You may see Larae Grooms, MD or one of the following Advanced Practice Providers on your designated Care Team:   Melina Copa, PA-C Ermalinda Barrios, PA-C   Other Instructions Bryn Gulling- Long Term Monitor Instructions  Your physician has requested you wear a ZIO patch monitor for 14 days.  This is a single patch monitor. [employer] supplies one patch monitor per enrollment. Additional stickers are not available. Please do not apply patch if you will be having a Nuclear Stress Test,  Echocardiogram, Cardiac CT, MRI, or Chest Xray during the period you would be wearing the  monitor. The patch cannot be worn during these tests. You cannot remove and re-apply the  ZIO XT patch monitor.  Your ZIO patch monitor will be mailed 3 day USPS to your address on file. It may take 3-5 days  to receive your monitor after you have been enrolled.  Once you have received your monitor, please review the enclosed instructions. Your monitor  has already been registered assigning a specific monitor serial # to you.  Billing and Patient Assistance Program Information  We have supplied [employer] with any of your insurance information on file for billing purposes. [employer] offers a sliding scale Patient Assistance Program for patients that do not have  insurance, or whose insurance does not completely cover the cost of the ZIO monitor.  You must apply for the Patient Assistance Program to qualify for this  discounted rate.  To apply, please call [employer] at [phone], select option 4, select option 2, ask to apply for  Patient Assistance Program. Theodore Demark will ask your household income, and how many people  are in your household. They will quote your out-of-pocket cost based on that information.  [employer] will also be able to set up a 34-month interest-free  payment plan if needed.  Applying the monitor   Shave hair from upper left chest.  Hold abrader disc by orange tab. Rub abrader in 40 strokes over the upper left chest as  indicated in your monitor instructions.  Clean area with 4 enclosed alcohol pads. Let dry.  Apply patch as indicated in monitor instructions. Patch will be placed under collarbone on left  side of chest with arrow pointing upward.  Rub patch adhesive wings for 2 minutes. Remove white label marked "1". Remove the white  label marked "2". Rub patch adhesive wings for 2 additional minutes.  While looking in a mirror, press and release button in center of patch. A small green light will  flash 3-4 times. This will be your only indicator that the monitor has been turned on.  Do not shower for the first 24 hours. You may shower after the first 24 hours.  Press the button if you feel a symptom. You will hear a small click. Record Date, Time and  Symptom in the Patient Logbook.  When you are ready to remove the patch, follow instructions on the last 2 pages of Patient  Logbook. Stick patch monitor onto the last page of Patient Logbook.  Place Patient Logbook in the blue and white box. Use locking tab on box and tape box closed  securely. The blue and white box has prepaid postage on it. Please place it in the mailbox as  soon as possible. Your physician should have your test results approximately 7 days after [facility]  [facility]  Call [facility] [phone] you have questions regarding  your ZIO XT patch monitor. Call them immediately if you see an orange light blinking on your  monitor.  If your monitor falls off in less than 4 days, contact our Monitor department at [phone]  If your monitor becomes loose or falls off after 4 days call [employer] at [phone]  suggestions on securing your monitor

## 2021-03-22 ENCOUNTER — Other Ambulatory Visit: Payer: Self-pay | Admitting: Cardiology

## 2021-04-01 ENCOUNTER — Other Ambulatory Visit: Payer: Self-pay | Admitting: Cardiology

## 2021-04-03 ENCOUNTER — Other Ambulatory Visit: Payer: Self-pay | Admitting: *Deleted

## 2021-04-03 DIAGNOSIS — I480 Paroxysmal atrial fibrillation: Secondary | ICD-10-CM

## 2021-04-03 MED ORDER — METOPROLOL SUCCINATE ER 25 MG PO TB24: 25.0000 mg | ORAL_TABLET | Freq: Every day | ORAL | 3 refills | Status: DC

## 2021-04-03 MED ORDER — RAMIPRIL 2.5 MG PO CAPS: 2.5000 mg | ORAL_CAPSULE | Freq: Every day | ORAL | 3 refills | Status: DC

## 2021-04-03 MED ORDER — RIVAROXABAN 20 MG PO TABS: ORAL_TABLET | ORAL | 3 refills | Status: DC

## 2021-04-03 MED ORDER — CLOPIDOGREL BISULFATE 75 MG PO TABS: 75.0000 mg | ORAL_TABLET | Freq: Every day | ORAL | 3 refills | Status: DC

## 2021-04-03 MED ORDER — EZETIMIBE 10 MG PO TABS: 10.0000 mg | ORAL_TABLET | Freq: Every day | ORAL | 3 refills | Status: DC

## 2021-04-28 ENCOUNTER — Other Ambulatory Visit: Payer: Self-pay | Admitting: Cardiology

## 2021-06-16 ENCOUNTER — Telehealth: Payer: Self-pay | Admitting: Interventional Cardiology

## 2021-06-16 NOTE — Telephone Encounter (Signed)
Pt c/o of Chest Pain: STAT if CP now or developed within 24 hours  1. Are you having CP right now? no   2. Are you experiencing any other symptoms (ex. SOB, nausea, vomiting, sweating)? Lightheaded, feels syncope on exertion randomly  3. How long have you been experiencing CP? Randomly for the last month  4. Is your CP continuous or coming and going? Comes and goes   5. Have you taken Nitroglycerin? yes ?

## 2021-06-16 NOTE — Telephone Encounter (Signed)
I spoke with patient. She reports she has been having episodes of feeling light headed. It comes on suddenly and she feels like she will pass out.  Has not passed out. Had episode today in the store. Episodes occur every 5-6 days over the past month. States she has been having pain in her back similar to pain prior to stent placement. Took NTG once with relief.  Other times pain has gone away on it's own. I scheduled patient to see Melina Copa, PA on 06/20/21 at 3:10.  ED precautions reviewed with patient.

## 2021-06-19 ENCOUNTER — Encounter: Payer: Self-pay | Admitting: Physician Assistant

## 2021-06-19 NOTE — Progress Notes (Signed)
Cardiology Office Note    Date:  06/20/2021   ID:  [name], [name] 1966/02/25, MRN [id number]  PCP:  Orpah Melter, MD  Cardiologist:  Larae Grooms, MD / Radford Pax - OSA Electrophysiologist:  None   Chief Complaint: back pain, lightheadedness  History of Present Illness:   Courtney Olson is a 55 y.o. female with history of HTN, DM, paroxysmal atrial fibrillation, multivessel CAD s/p DES to [facility] 05/2019, recurrent vasovagal syncope, pancolitis, calcifications of the abdominal aorta by CT, CMV hepatitis, hyperlipidemia, OSA, RA, GERD, obesity, bradycardia, pancreatitis, CKD stage 2 by labs who presents for follow-up.  She had prior reassuring cardiac testing in 2014 and 2015 but went on to develop atrial fib and CAD in later years. In 2015 an event monitor demonstrated PAF so she was placed on anticoagulation. She also has a history of recurrent syncope. She has a history of pancreatitis and kidney mass and underwent a large resection of a benign mass complicated by multiple infections. In July 2015 she had an episode of syncope following severe abdominal pain, felt vasovagal in nature. She had actually reported >40 episodes of syncope in the last 10 years. These initially began in the setting of anaphylactic reactions but in more recent years had followed acute abdominal pain related to her ulcerative colitis. It has been felt that prevention of the GI pain was the key to avoiding further syncope, suspected vasovagal. Cardiac monitor 05/2016 showed NSR (HR 51-127bpm), average HR 73bpm, isolated atrial couplet. She was seen by Dr. Radford Pax in 2020 for dyspnea on exertion. 2D echo 05/06/19 showed EF 60-65%, impaired diastolic relaxation, no acute abnormalities. Coronary CTA 05/13/19 was abnormal prompting cardiac cath as below s/p DES to RCA, with residual disease treated medically. LE arterial testing 11/2019 showed calcified vessels but no significant obstructive disease. Her last nuc stress  test for a myriad of symptoms 03/2020 was normal. She's had several subsequent monitors. Event monitor 04/2020 showed average HR 71, range 46-121, occasional PVCs, bigeminal PVCs, ventricular couplets and nonsustained ventricular tachycardia up to 6 beats (PVC load <1%), occasional PACs and nonsustained atrial tachycardia. Metoprolol was increased. She was last seen in the office 02/2021 with intermittent palpitations. Repeat event monitor 03/2021 showed NSR with rare PACs/PVCs, brief rare runs of consecutive PACs, PVCs, bigeminy, but no atrial fibrillation. She has history of diastolic dysfunction as outlined above but no clinical heart failure (LVEDP normal in 2020).  She is seen back for follow-up reporting she is very concerned about two issues that have been present for about 7 weeks: - Episodic mid back pain that reminds her of prior angina - distinctly recalls this was present for months before her PCI and had resolved completely after stenting. She has been having this about once every 5 days - can happen with exertion but also happened laying in bed or riding in the car. Has lasted minutes or hours. Has taken SL NTG before with relief. No associated sx or particular triggers.  - Episodic lightheadedness - about once every 5-6 days, mostly with exertion but sometimes when just standing doing nothing at all. Has to brace herself. Almost reminds her of a panic attack. Once she steadies herself she feels OK. Feels like she cannot take a deep breath when this is happening. No full syncope.  She has felt fine the last 2 days without symptoms. Her orthostatic VS are negative although BP is up, which she states is unusual for her: Lying 170/84, HR 71 Sitting 185/89,  HR 71 Standing 39m 182/96, HR 80 Standing 34m 184/94, HR 79   Labwork independently reviewed: 09/2020 K 4.5, Cr 1.07, AST wnl, ALT 38(up), glucose 142, LDL 49 07/2020 A1C 8.1, BNP 251   Past Medical History:  Diagnosis Date   Aortic  calcification (HCC)    Atrial tachycardia (HCC)    Atrophic vaginitis 01/2006   Bradycardia 05/2014   CAD (coronary artery disease)    cardiac cath 05/21/2019 showing 805 mRCA, 60% D1 and D2 that were very small and patent LAD and LCx.  She underwent DES to the [facility] with residual 25% stenosis in the dRCA   Chronic pancolonic ulcerative colitis ([place]) 2015   unknown if chronic.   CKD (chronic kidney disease), stage II    Cytomegaloviral hepatitis ([place]) 2005   Diabetes mellitus without complication ([place]) 62/6948   on Ace inhibitor to protect kidneys   Gallstones 01/2013   GERD (gastroesophageal reflux disease)    Hyperlipidemia    Hypertension    Kidney tumor 03/2013   Right   NSVT (nonsustained ventricular tachycardia)    Obesity    OSA (obstructive sleep apnea)    PAF (paroxysmal atrial fibrillation) ([place])    Pancreatitis 2014   Postmenopausal    Premature atrial contractions    PVC's (premature ventricular contractions)    RA (rheumatoid arthritis) ([place])    Syncope and collapse    Ulcerative colitis ([place]) 2015    Past Surgical History:  Procedure Laterality Date   CESAREAN SECTION     CHOLECYSTECTOMY  01/2013   COLONOSCOPY  2015   Colitis   CORONARY STENT INTERVENTION N/A 05/21/2019   Procedure: CORONARY STENT INTERVENTION;  Surgeon: Jettie Booze, MD;  Location: [facility];  Service: Cardiovascular;  Laterality: N/A;   DILATION AND CURETTAGE OF UTERUS  2002   HERNIA REPAIR  01/2013   Kidney and Bladder Stent and tube placed Right 03/2013   PARTIAL NEPHRECTOMY Right 02/2013   Tumor Removal    Procedure to remove kidney and bladder stent and tube Right 04/2013   RIGHT/LEFT HEART CATH AND CORONARY ANGIOGRAPHY N/A 05/21/2019   Procedure: RIGHT/LEFT HEART CATH AND CORONARY ANGIOGRAPHY;  Surgeon: Jettie Booze, MD;  Location: [facility];  Service: Cardiovascular;  Laterality: N/A;   TOTAL ABDOMINAL HYSTERECTOMY W/ BILATERAL SALPINGOOPHORECTOMY  2002    Secondary to AUB    Current Medications: Current Meds  Medication Sig   ALPRAZolam (XANAX) 0.25 MG tablet Take 1 tablet (0.25 mg total) by mouth 2 (two) times daily as needed.   atorvastatin (LIPITOR) 40 MG tablet TAKE 1 TABLET(40 MG) BY MOUTH DAILY   Cholecalciferol (VITAMIN D3 PO) Take 1 capsule by mouth as directed.   clopidogrel (PLAVIX) 75 MG tablet Take 1 tablet (75 mg total) by mouth daily with breakfast.   EPINEPHrine 0.3 mg/0.3 mL IJ SOAJ injection Inject 0.3 mLs into the skin as needed.   Estradiol 10 MCG TABS vaginal tablet Place one tablet vaginal qhs x 1 week, then change to 2 x a week.   ezetimibe (ZETIA) 10 MG tablet Take 1 tablet (10 mg total) by mouth daily.   furosemide (LASIX) 20 MG tablet Take 20 mg by mouth as needed for fluid or edema.   insulin aspart (NOVOLOG) 100 UNIT/ML injection Inject 14 Units into the skin 2 (two) times daily.   Insulin Glargine (BASAGLAR KWIKPEN) 100 UNIT/ML SOPN Inject 8 Units into the skin 2 (two) times daily.   mesalamine (LIALDA) 1.2 g  EC tablet Take 1.2 g by mouth daily.    metoprolol succinate (TOPROL-XL) 25 MG 24 hr tablet Take 1 tablet (25 mg total) by mouth daily.   Multiple Vitamin (MULTIVITAMIN) tablet Take by mouth.   nitroGLYCERIN (NITROSTAT) 0.4 MG SL tablet Place 1 tablet (0.4 mg total) under the tongue every 5 (five) minutes as needed.   pantoprazole (PROTONIX) 40 MG tablet TAKE 1 TABLET(40 MG) BY MOUTH DAILY   ramipril (ALTACE) 2.5 MG capsule Take 1 capsule (2.5 mg total) by mouth daily.   rivaroxaban (XARELTO) 20 MG TABS tablet TAKE 1 TABLET(20 MG) BY MOUTH DAILY   triamcinolone (KENALOG) 0.1 % Apply topically.   TRULICITY 1.5 NF/6.2ZH SOPN Inject 3 mg into the skin every 7 (seven) days.      Allergies:   Orange fruit [citrus], Other, Bactrim [sulfamethoxazole-trimethoprim], and Tape   Social History   Socioeconomic History   Marital status: Married    Spouse name: Not on file   Number of children: Not on file    Years of education: 16   Highest education level: Bachelor's degree (e.g., BA, AB, BS)  Occupational History   Not on file  Tobacco Use   Smoking status: Never   Smokeless tobacco: Never  Vaping Use   Vaping Use: Never used  Substance and Sexual Activity   Alcohol use: Yes    Comment: 2 a month   Drug use: No   Sexual activity: Yes    Partners: Male    Birth control/protection: Surgical    Comment: TAH/BSO  Other Topics Concern   Not on file  Social History Narrative   Not on file   Social Determinants of Health   Financial Resource Strain: Not on file  Food Insecurity: Not on file  Transportation Needs: Not on file  Physical Activity: Not on file  Stress: Not on file  Social Connections: Not on file     Family History:  The patient's family history includes Breast cancer (age of onset: 1) in her sister; Cancer in her maternal grandmother and paternal aunt; Diabetes in her father, maternal grandmother, mother, and paternal aunt; Fibromyalgia in her mother; Heart disease in her father; Hypertension in her father, mother, and sister.  ROS:   Please see the history of present illness.  All other systems are reviewed and otherwise negative.    EKGs/Labs/Other Studies Reviewed:    Studies reviewed are outlined and summarized above. Reports included below if pertinent.  Cath 05/2019 Prox RCA lesion is 25% stenosed. Mid RCA lesion is 80% stenosed. Dist RCA lesion is 25% stenosed. The left ventricular systolic function is normal. LV end diastolic pressure is normal. The left ventricular ejection fraction is 55-65% by visual estimate. There is no aortic valve stenosis. 1st Diag and 2nd Diag, 60% stenosed. Very small vesssels. Ao sat 99%, PA sat 76%, mean PA pressure 14 mm Hg; mean PCWP 4 mm Hg. A drug-eluting stent was successfully placed using a STENT SYNERGY DES 2.75X20. Post intervention, there is a 0% residual stenosis.   Generally calcified RCA with moderate  disease proximally and distally.  If PCI was needed, would have to consider more supportive guide and guide extension to deliver stents more distally.    Continue aggressive secondary prevention.     Plan for same day PCI.     Echo 04/2019  1. Left ventricular ejection fraction, by visual estimation, is 60 to  65%. The left ventricle has normal function. Normal left ventricular size.  There is  no left ventricular hypertrophy. Normal wall motion.   2. Left ventricular diastolic Doppler parameters are consistent with  impaired relaxation pattern of LV diastolic filling.   3. Global right ventricle has normal systolic function.The right  ventricular size is normal. No increase in right ventricular wall  thickness.   4. Left atrial size was normal.   5. Right atrial size was normal.   6. The mitral valve is normal in structure. No evidence of mitral valve  regurgitation. No evidence of mitral stenosis.   7. The tricuspid valve is normal in structure. Tricuspid valve  regurgitation was not visualized by color flow Doppler.   8. The aortic valve is tricuspid Aortic valve regurgitation was not  visualized by color flow Doppler. Mild aortic valve sclerosis without  stenosis.   9. TR signal is inadequate for assessing pulmonary artery systolic  pressure.  10. The inferior vena cava is normal in size with greater than 50%  respiratory variability, suggesting right atrial pressure of 3 mmHg.   NST 03/2020 Nuclear stress EF: 75%. There was no ST segment deviation noted during stress. The study is normal. This is a low risk study. The left ventricular ejection fraction is hyperdynamic (>65%).   Normal stress nuclear study with no ischemia or infarction; gated EF 75; normal wall motion.   Monitor 04/2020 Sinus bradycardia, normal sinus rhythm and sinus tachycardia. The average heart rate was 71bpm and ranged from 46 to 121bpm. Occasional PVCs, bigeminal PVCs, ventricular couplets and  nonsustained ventricular tachycardia up to 6 beats. PVC load < 1%. Occasional PACs and nonsustained atrial tachycardia.   Monitor 03/2021 Normal sinus rhythm with rare PACs and PVCs. Rare brief runs of consecutive PACs and PVCs. Rare Bigeminy noted. No atrial fibrillation.     Patch Wear Time:  13 days and 5 hours (2022-08-28T15:25:37-0400 to 2022-09-10T20:44:20-0400)   Patient had a min HR of 49 bpm, max HR of 158 bpm, and avg HR of 71 bpm. Predominant underlying rhythm was Sinus Rhythm. First Degree AV Block was present. 1 run of Ventricular Tachycardia occurred lasting 4 beats with a max rate of 136 bpm (avg 117  bpm). 9 Supraventricular Tachycardia runs occurred, the run with the fastest interval lasting 8 beats with a max rate of 158 bpm, the longest lasting 17 beats with an avg rate of 97 bpm. Isolated SVEs were rare (<1.0%), SVE Couplets were rare (<1.0%),  and SVE Triplets were rare (<1.0%). Isolated VEs were rare (<1.0%), VE Couplets were rare (<1.0%), and no VE Triplets were present. Ventricular Bigeminy and Trigeminy were present. Difficulty discerning atrial activity making definitive diagnosis  difficult to ascertain.   Vas studies 11/2020    Summary:  Right: Resting right ankle-brachial index indicates noncompressible right  lower extremity arteries. The right toe-brachial index is normal. ABIs are  unreliable.   Left: Resting left ankle-brachial index indicates noncompressible left  lower extremity arteries. The left toe-brachial index is normal. ABIs are  unreliable.   Summary:  Right: ABIs are unreliable. Heavy medial wall calcifications seen mid  thigh down to ankle. No evidence of stenosis.   Left: ABIs are unreliable. Heavy medial wall calcifications seen mid thigh  down to ankle. No evidence of stenosis.     EKG:  EKG is ordered today, personally reviewed, demonstrating NSr 71bpm, nonspecific STT change in III, similar to prior, normal intervals  Recent  Labs: 08/09/2020: NT-Pro BNP 251 10/06/2020: ALT 38; BUN 16; Creatinine, Ser 1.07; Potassium 4.5; Sodium 144  Recent Lipid  Panel    Component Value Date/Time   CHOL 128 10/06/2020 1217   TRIG 115 10/06/2020 1217   HDL 58 10/06/2020 1217   CHOLHDL 2.2 10/06/2020 1217   CHOLHDL 4 05/17/2014 1113   VLDL 34.4 05/17/2014 1113   LDLCALC 49 10/06/2020 1217    PHYSICAL EXAM:    VS:  BP (!) 170/84 (BP Location: Left Arm, Patient Position: Sitting, Cuff Size: Normal)   Pulse 71   Ht $R'5\' 5"'Yk$  (1.651 m)   Wt 207 lb (93.9 kg)   LMP 07/16/2000 (Approximate)   BMI 34.45 kg/m   BMI: Body mass index is 34.45 kg/m.  GEN: Well nourished, well developed female in no acute distress HEENT: normocephalic, atraumatic Neck: no JVD, carotid bruits, or masses Cardiac: RRR; no murmurs, rubs, or gallops, no edema  Respiratory:  clear to auscultation bilaterally, normal work of breathing GI: soft, nontender, nondistended, + BS MS: no deformity or atrophy Skin: warm and dry, no rash Neuro:  Alert and Oriented x 3, Strength and sensation are intact, follows commands Psych: euthymic mood, full affect  Wt Readings from Last 3 Encounters:  06/20/21 207 lb (93.9 kg)  02/23/21 197 lb 12.8 oz (89.7 kg)  10/26/20 205 lb (93 kg)     ASSESSMENT & PLAN:   1. Episodic back pain, query atypical angina with known history of CAD and also aortic/peripheral calcifications - challenging to know whether this represents ischemia or not given mixed features but patient is very concerned that this symptom feels like what she had before her PCI. She reports it had totally resolved after stenting. EKG unchanged from prior. Coronary CTA is not an option due to prior PCI. She had an unrevealing stress test last year (and also had prior normal nucs before prior PCI.) We discussed proceeding with updated nuclear stress test versus heart catheterization. Per shared decision making she is concerned about getting a definitive answer so  would like to pursue cardiac cath. We will make arrangements to do so. In the interim will add trial of isosorbide. Continue clopidogrel, Zetia, metoprolol, atorvastatin. Also d/w Dr. Irish Lack who agrees with plan. Will give instructions for holding anticoagulation, DM regimen prior to procedure per protocol. Check labs today.  Shared Decision Making/Informed Consent The risks [stroke (1 in 1000), death (1 in 1000), kidney failure [usually temporary] (1 in 500), bleeding (1 in 200), allergic reaction [possibly serious] (1 in 200)], benefits (diagnostic support and management of coronary artery disease) and alternatives of a cardiac catheterization were discussed in detail with Courtney Olson and she is willing to proceed.  2. Lightheadedness, known history of vasovagal syncope/ectopy - unclear if this represents a cardiovascular event or vasovagal symptom in the context of what she described as feeling like a panic attack. She has history of anxiety in the past which may be contributing but it is prudent to repeat 2D echo and 2 week Zio. She states she did not have this symptom when wearing last monitor.  3. Essential HTN - BP elevated, coming down into the 160s on recheck. She states this is unusual for her. Follow with titration of isosorbide.   4. Hyperlipidemia - last labs reviewed, LDL at goal this year, continue current therapy.   5. Paroxysmal atrial fibrillation with history of PACs, PVCs, brief NSVT, atrial tachycardia - maintaining NSR, will follow to see if any new findings on her event monitor above. Check labs today.    Disposition: F/u with Dr. Erie Noe.  Medication Adjustments/Labs and Tests  Ordered: Current medicines are reviewed at length with the patient today.  Concerns regarding medicines are outlined above. Medication changes, Labs and Tests ordered today are summarized above and listed in the Patient Instructions accessible in Encounters.   Signed, Charlie Pitter, PA-C  06/20/2021 3:38 PM    [place] Phone: [phone]; Fax: [phone]

## 2021-06-19 NOTE — H&P (View-Only) (Signed)
Cardiology Office Note    Date:  06/20/2021   ID:  Courtney, Olson [birth year], MRN [id number]  PCP:  Orpah Melter, MD  Cardiologist:  Larae Grooms, MD / Radford Pax - OSA Electrophysiologist:  None   Chief Complaint: back pain, lightheadedness  History of Present Illness:   Courtney Olson is a 55 y.o. female with history of HTN, DM, paroxysmal atrial fibrillation, multivessel CAD s/p DES to [facility] 05/2019, recurrent vasovagal syncope, pancolitis, calcifications of the abdominal aorta by CT, CMV hepatitis, hyperlipidemia, OSA, RA, GERD, obesity, bradycardia, pancreatitis, CKD stage 2 by labs who presents for follow-up.  She had prior reassuring cardiac testing in 2014 and 2015 but went on to develop atrial fib and CAD in later years. In 2015 an event monitor demonstrated PAF so she was placed on anticoagulation. She also has a history of recurrent syncope. She has a history of pancreatitis and kidney mass and underwent a large resection of a benign mass complicated by multiple infections. In July 2015 she had an episode of syncope following severe abdominal pain, felt vasovagal in nature. She had actually reported >40 episodes of syncope in the last 10 years. These initially began in the setting of anaphylactic reactions but in more recent years had followed acute abdominal pain related to her ulcerative colitis. It has been felt that prevention of the GI pain was the key to avoiding further syncope, suspected vasovagal. Cardiac monitor 05/2016 showed NSR (HR 51-127bpm), average HR 73bpm, isolated atrial couplet. She was seen by Dr. Radford Pax in 2020 for dyspnea on exertion. 2D echo 05/06/19 showed EF 60-65%, impaired diastolic relaxation, no acute abnormalities. Coronary CTA 05/13/19 was abnormal prompting cardiac cath as below s/p DES to RCA, with residual disease treated medically. LE arterial testing 11/2019 showed calcified vessels but no significant obstructive disease. Her last nuc stress  test for a myriad of symptoms 03/2020 was normal. She's had several subsequent monitors. Event monitor 04/2020 showed average HR 71, range 46-121, occasional PVCs, bigeminal PVCs, ventricular couplets and nonsustained ventricular tachycardia up to 6 beats (PVC load <1%), occasional PACs and nonsustained atrial tachycardia. Metoprolol was increased. She was last seen in the office 02/2021 with intermittent palpitations. Repeat event monitor 03/2021 showed NSR with rare PACs/PVCs, brief rare runs of consecutive PACs, PVCs, bigeminy, but no atrial fibrillation. She has history of diastolic dysfunction as outlined above but no clinical heart failure (LVEDP normal in 2020).  She is seen back for follow-up reporting she is very concerned about two issues that have been present for about 7 weeks: - Episodic mid back pain that reminds her of prior angina - distinctly recalls this was present for months before her PCI and had resolved completely after stenting. She has been having this about once every 5 days - can happen with exertion but also happened laying in bed or riding in the car. Has lasted minutes or hours. Has taken SL NTG before with relief. No associated sx or particular triggers.  - Episodic lightheadedness - about once every 5-6 days, mostly with exertion but sometimes when just standing doing nothing at all. Has to brace herself. Almost reminds her of a panic attack. Once she steadies herself she feels OK. Feels like she cannot take a deep breath when this is happening. No full syncope.  She has felt fine the last 2 days without symptoms. Her orthostatic VS are negative although BP is up, which she states is unusual for her: Lying 170/84, HR 71 Sitting 185/89,  HR 71 Standing 31m 182/96, HR 80 Standing 74m 184/94, HR 79   Labwork independently reviewed: 09/2020 K 4.5, Cr 1.07, AST wnl, ALT 38(up), glucose 142, LDL 49 07/2020 A1C 8.1, BNP 251   Past Medical History:  Diagnosis Date   Aortic  calcification (HCC)    Atrial tachycardia (HCC)    Atrophic vaginitis 01/2006   Bradycardia 05/2014   CAD (coronary artery disease)    cardiac cath 05/21/2019 showing 805 mRCA, 60% D1 and D2 that were very small and patent LAD and LCx.  She underwent DES to the [facility] with residual 25% stenosis in the dRCA   Chronic pancolonic ulcerative colitis ([place]) 2015   unknown if chronic.   CKD (chronic kidney disease), stage II    Cytomegaloviral hepatitis ([place]) 2005   Diabetes mellitus without complication ([place]) 99/2426   on Ace inhibitor to protect kidneys   Gallstones 01/2013   GERD (gastroesophageal reflux disease)    Hyperlipidemia    Hypertension    Kidney tumor 03/2013   Right   NSVT (nonsustained ventricular tachycardia)    Obesity    OSA (obstructive sleep apnea)    PAF (paroxysmal atrial fibrillation) ([place])    Pancreatitis 2014   Postmenopausal    Premature atrial contractions    PVC's (premature ventricular contractions)    RA (rheumatoid arthritis) ([place])    Syncope and collapse    Ulcerative colitis ([place]) 2015    Past Surgical History:  Procedure Laterality Date   CESAREAN SECTION     CHOLECYSTECTOMY  01/2013   COLONOSCOPY  2015   Colitis   CORONARY STENT INTERVENTION N/A 05/21/2019   Procedure: CORONARY STENT INTERVENTION;  Surgeon: Jettie Booze, MD;  Location: [place] [facility];  Service: Cardiovascular;  Laterality: N/A;   DILATION AND CURETTAGE OF UTERUS  2002   HERNIA REPAIR  01/2013   Kidney and Bladder Stent and tube placed Right 03/2013   PARTIAL NEPHRECTOMY Right 02/2013   Tumor Removal    Procedure to remove kidney and bladder stent and tube Right 04/2013   RIGHT/LEFT HEART CATH AND CORONARY ANGIOGRAPHY N/A 05/21/2019   Procedure: RIGHT/LEFT HEART CATH AND CORONARY ANGIOGRAPHY;  Surgeon: Jettie Booze, MD;  Location: [place] [facility];  Service: Cardiovascular;  Laterality: N/A;   TOTAL ABDOMINAL HYSTERECTOMY W/ BILATERAL SALPINGOOPHORECTOMY  2002    Secondary to AUB    Current Medications: Current Meds  Medication Sig   ALPRAZolam (XANAX) 0.25 MG tablet Take 1 tablet (0.25 mg total) by mouth 2 (two) times daily as needed.   atorvastatin (LIPITOR) 40 MG tablet TAKE 1 TABLET(40 MG) BY MOUTH DAILY   Cholecalciferol (VITAMIN D3 PO) Take 1 capsule by mouth as directed.   clopidogrel (PLAVIX) 75 MG tablet Take 1 tablet (75 mg total) by mouth daily with breakfast.   EPINEPHrine 0.3 mg/0.3 mL IJ SOAJ injection Inject 0.3 mLs into the skin as needed.   Estradiol 10 MCG TABS vaginal tablet Place one tablet vaginal qhs x 1 week, then change to 2 x a week.   ezetimibe (ZETIA) 10 MG tablet Take 1 tablet (10 mg total) by mouth daily.   furosemide (LASIX) 20 MG tablet Take 20 mg by mouth as needed for fluid or edema.   insulin aspart (NOVOLOG) 100 UNIT/ML injection Inject 14 Units into the skin 2 (two) times daily.   Insulin Glargine (BASAGLAR KWIKPEN) 100 UNIT/ML SOPN Inject 8 Units into the skin 2 (two) times daily.   mesalamine (LIALDA) 1.2 g  EC tablet Take 1.2 g by mouth daily.    metoprolol succinate (TOPROL-XL) 25 MG 24 hr tablet Take 1 tablet (25 mg total) by mouth daily.   Multiple Vitamin (MULTIVITAMIN) tablet Take by mouth.   nitroGLYCERIN (NITROSTAT) 0.4 MG SL tablet Place 1 tablet (0.4 mg total) under the tongue every 5 (five) minutes as needed.   pantoprazole (PROTONIX) 40 MG tablet TAKE 1 TABLET(40 MG) BY MOUTH DAILY   ramipril (ALTACE) 2.5 MG capsule Take 1 capsule (2.5 mg total) by mouth daily.   rivaroxaban (XARELTO) 20 MG TABS tablet TAKE 1 TABLET(20 MG) BY MOUTH DAILY   triamcinolone (KENALOG) 0.1 % Apply topically.   TRULICITY 1.5 LE/7.5TZ SOPN Inject 3 mg into the skin every 7 (seven) days.      Allergies:   Orange fruit [citrus], Other, Bactrim [sulfamethoxazole-trimethoprim], and Tape   Social History   Socioeconomic History   Marital status: Married    Spouse name: Not on file   Number of children: Not on file    Years of education: 16   Highest education level: Bachelor's degree (e.g., BA, AB, BS)  Occupational History   Not on file  Tobacco Use   Smoking status: Never   Smokeless tobacco: Never  Vaping Use   Vaping Use: Never used  Substance and Sexual Activity   Alcohol use: Yes    Comment: 2 a month   Drug use: No   Sexual activity: Yes    Partners: Male    Birth control/protection: Surgical    Comment: TAH/BSO  Other Topics Concern   Not on file  Social History Narrative   Not on file   Social Determinants of Health   Financial Resource Strain: Not on file  Food Insecurity: Not on file  Transportation Needs: Not on file  Physical Activity: Not on file  Stress: Not on file  Social Connections: Not on file     Family History:  The patient's family history includes Breast cancer (age of onset: 12) in her sister; Cancer in her maternal grandmother and paternal aunt; Diabetes in her father, maternal grandmother, mother, and paternal aunt; Fibromyalgia in her mother; Heart disease in her father; Hypertension in her father, mother, and sister.  ROS:   Please see the history of present illness.  All other systems are reviewed and otherwise negative.    EKGs/Labs/Other Studies Reviewed:    Studies reviewed are outlined and summarized above. Reports included below if pertinent.  Cath 05/2019 Prox RCA lesion is 25% stenosed. Mid RCA lesion is 80% stenosed. Dist RCA lesion is 25% stenosed. The left ventricular systolic function is normal. LV end diastolic pressure is normal. The left ventricular ejection fraction is 55-65% by visual estimate. There is no aortic valve stenosis. 1st Diag and 2nd Diag, 60% stenosed. Very small vesssels. Ao sat 99%, PA sat 76%, mean PA pressure 14 mm Hg; mean PCWP 4 mm Hg. A drug-eluting stent was successfully placed using a STENT SYNERGY DES 2.75X20. Post intervention, there is a 0% residual stenosis.   Generally calcified RCA with moderate  disease proximally and distally.  If PCI was needed, would have to consider more supportive guide and guide extension to deliver stents more distally.    Continue aggressive secondary prevention.     Plan for same day PCI.     Echo 04/2019  1. Left ventricular ejection fraction, by visual estimation, is 60 to  65%. The left ventricle has normal function. Normal left ventricular size.  There is  no left ventricular hypertrophy. Normal wall motion.   2. Left ventricular diastolic Doppler parameters are consistent with  impaired relaxation pattern of LV diastolic filling.   3. Global right ventricle has normal systolic function.The right  ventricular size is normal. No increase in right ventricular wall  thickness.   4. Left atrial size was normal.   5. Right atrial size was normal.   6. The mitral valve is normal in structure. No evidence of mitral valve  regurgitation. No evidence of mitral stenosis.   7. The tricuspid valve is normal in structure. Tricuspid valve  regurgitation was not visualized by color flow Doppler.   8. The aortic valve is tricuspid Aortic valve regurgitation was not  visualized by color flow Doppler. Mild aortic valve sclerosis without  stenosis.   9. TR signal is inadequate for assessing pulmonary artery systolic  pressure.  10. The inferior vena cava is normal in size with greater than 50%  respiratory variability, suggesting right atrial pressure of 3 mmHg.   NST 03/2020 Nuclear stress EF: 75%. There was no ST segment deviation noted during stress. The study is normal. This is a low risk study. The left ventricular ejection fraction is hyperdynamic (>65%).   Normal stress nuclear study with no ischemia or infarction; gated EF 75; normal wall motion.   Monitor 04/2020 Sinus bradycardia, normal sinus rhythm and sinus tachycardia. The average heart rate was 71bpm and ranged from 46 to 121bpm. Occasional PVCs, bigeminal PVCs, ventricular couplets and  nonsustained ventricular tachycardia up to 6 beats. PVC load < 1%. Occasional PACs and nonsustained atrial tachycardia.   Monitor 03/2021 Normal sinus rhythm with rare PACs and PVCs. Rare brief runs of consecutive PACs and PVCs. Rare Bigeminy noted. No atrial fibrillation.     Patch Wear Time:  13 days and 5 hours (2022-08-28T15:25:37-0400 to 2022-09-10T20:44:20-0400)   Patient had a min HR of 49 bpm, max HR of 158 bpm, and avg HR of 71 bpm. Predominant underlying rhythm was Sinus Rhythm. First Degree AV Block was present. 1 run of Ventricular Tachycardia occurred lasting 4 beats with a max rate of 136 bpm (avg 117  bpm). 9 Supraventricular Tachycardia runs occurred, the run with the fastest interval lasting 8 beats with a max rate of 158 bpm, the longest lasting 17 beats with an avg rate of 97 bpm. Isolated SVEs were rare (<1.0%), SVE Couplets were rare (<1.0%),  and SVE Triplets were rare (<1.0%). Isolated VEs were rare (<1.0%), VE Couplets were rare (<1.0%), and no VE Triplets were present. Ventricular Bigeminy and Trigeminy were present. Difficulty discerning atrial activity making definitive diagnosis  difficult to ascertain.   Vas studies 11/2020    Summary:  Right: Resting right ankle-brachial index indicates noncompressible right  lower extremity arteries. The right toe-brachial index is normal. ABIs are  unreliable.   Left: Resting left ankle-brachial index indicates noncompressible left  lower extremity arteries. The left toe-brachial index is normal. ABIs are  unreliable.   Summary:  Right: ABIs are unreliable. Heavy medial wall calcifications seen mid  thigh down to ankle. No evidence of stenosis.   Left: ABIs are unreliable. Heavy medial wall calcifications seen mid thigh  down to ankle. No evidence of stenosis.     EKG:  EKG is ordered today, personally reviewed, demonstrating NSr 71bpm, nonspecific STT change in III, similar to prior, normal intervals  Recent  Labs: 08/09/2020: NT-Pro BNP 251 10/06/2020: ALT 38; BUN 16; Creatinine, Ser 1.07; Potassium 4.5; Sodium 144  Recent Lipid  Panel    Component Value Date/Time   CHOL 128 10/06/2020 1217   TRIG 115 10/06/2020 1217   HDL 58 10/06/2020 1217   CHOLHDL 2.2 10/06/2020 1217   CHOLHDL 4 05/17/2014 1113   VLDL 34.4 05/17/2014 1113   LDLCALC 49 10/06/2020 1217    PHYSICAL EXAM:    VS:  BP (!) 170/84 (BP Location: Left Arm, Patient Position: Sitting, Cuff Size: Normal)   Pulse 71   Ht $R'5\' 5"'vB$  (1.651 m)   Wt 207 lb (93.9 kg)   LMP 07/16/2000 (Approximate)   BMI 34.45 kg/m   BMI: Body mass index is 34.45 kg/m.  GEN: Well nourished, well developed female in no acute distress HEENT: normocephalic, atraumatic Neck: no JVD, carotid bruits, or masses Cardiac: RRR; no murmurs, rubs, or gallops, no edema  Respiratory:  clear to auscultation bilaterally, normal work of breathing GI: soft, nontender, nondistended, + BS MS: no deformity or atrophy Skin: warm and dry, no rash Neuro:  Alert and Oriented x 3, Strength and sensation are intact, follows commands Psych: euthymic mood, full affect  Wt Readings from Last 3 Encounters:  06/20/21 207 lb (93.9 kg)  02/23/21 197 lb 12.8 oz (89.7 kg)  10/26/20 205 lb (93 kg)     ASSESSMENT & PLAN:   1. Episodic back pain, query atypical angina with known history of CAD and also aortic/peripheral calcifications - challenging to know whether this represents ischemia or not given mixed features but patient is very concerned that this symptom feels like what she had before her PCI. She reports it had totally resolved after stenting. EKG unchanged from prior. Coronary CTA is not an option due to prior PCI. She had an unrevealing stress test last year (and also had prior normal nucs before prior PCI.) We discussed proceeding with updated nuclear stress test versus heart catheterization. Per shared decision making she is concerned about getting a definitive answer so  would like to pursue cardiac cath. We will make arrangements to do so. In the interim will add trial of isosorbide. Continue clopidogrel, Zetia, metoprolol, atorvastatin. Also d/w Dr. Irish Lack who agrees with plan. Will give instructions for holding anticoagulation, DM regimen prior to procedure per protocol. Check labs today.  Shared Decision Making/Informed Consent The risks [stroke (1 in 1000), death (1 in 1000), kidney failure [usually temporary] (1 in 500), bleeding (1 in 200), allergic reaction [possibly serious] (1 in 200)], benefits (diagnostic support and management of coronary artery disease) and alternatives of a cardiac catheterization were discussed in detail with Ms. Markiewicz and she is willing to proceed.  2. Lightheadedness, known history of vasovagal syncope/ectopy - unclear if this represents a cardiovascular event or vasovagal symptom in the context of what she described as feeling like a panic attack. She has history of anxiety in the past which may be contributing but it is prudent to repeat 2D echo and 2 week Zio. She states she did not have this symptom when wearing last monitor.  3. Essential HTN - BP elevated, coming down into the 160s on recheck. She states this is unusual for her. Follow with titration of isosorbide.   4. Hyperlipidemia - last labs reviewed, LDL at goal this year, continue current therapy.   5. Paroxysmal atrial fibrillation with history of PACs, PVCs, brief NSVT, atrial tachycardia - maintaining NSR, will follow to see if any new findings on her event monitor above. Check labs today.    Disposition: F/u with Dr. Erie Noe.  Medication Adjustments/Labs and Tests  Ordered: Current medicines are reviewed at length with the patient today.  Concerns regarding medicines are outlined above. Medication changes, Labs and Tests ordered today are summarized above and listed in the Patient Instructions accessible in Encounters.   Signed, Charlie Pitter, PA-C  06/20/2021 3:38 PM    [place] Phone: [phone]; Fax: [phone]

## 2021-06-20 ENCOUNTER — Encounter: Payer: Self-pay | Admitting: Physician Assistant

## 2021-06-20 ENCOUNTER — Ambulatory Visit (INDEPENDENT_AMBULATORY_CARE_PROVIDER_SITE_OTHER): Payer: 59 | Admitting: Physician Assistant

## 2021-06-20 ENCOUNTER — Other Ambulatory Visit: Payer: Self-pay

## 2021-06-20 ENCOUNTER — Ambulatory Visit (INDEPENDENT_AMBULATORY_CARE_PROVIDER_SITE_OTHER): Payer: 59

## 2021-06-20 VITALS — BP 170/84 | HR 71 | Ht 65.0 in | Wt 207.0 lb

## 2021-06-20 DIAGNOSIS — I208 Other forms of angina pectoris: Secondary | ICD-10-CM

## 2021-06-20 DIAGNOSIS — I491 Atrial premature depolarization: Secondary | ICD-10-CM

## 2021-06-20 DIAGNOSIS — I493 Ventricular premature depolarization: Secondary | ICD-10-CM

## 2021-06-20 DIAGNOSIS — E785 Hyperlipidemia, unspecified: Secondary | ICD-10-CM | POA: Diagnosis not present

## 2021-06-20 DIAGNOSIS — R42 Dizziness and giddiness: Secondary | ICD-10-CM | POA: Diagnosis not present

## 2021-06-20 DIAGNOSIS — I10 Essential (primary) hypertension: Secondary | ICD-10-CM | POA: Diagnosis not present

## 2021-06-20 DIAGNOSIS — I2510 Atherosclerotic heart disease of native coronary artery without angina pectoris: Secondary | ICD-10-CM | POA: Diagnosis not present

## 2021-06-20 DIAGNOSIS — I480 Paroxysmal atrial fibrillation: Secondary | ICD-10-CM

## 2021-06-20 MED ORDER — SODIUM CHLORIDE 0.9% FLUSH: 3.0000 mL | Freq: Two times a day (BID) | INTRAVENOUS | Status: DC

## 2021-06-20 MED ORDER — ISOSORBIDE MONONITRATE ER 30 MG PO TB24: 30.0000 mg | ORAL_TABLET | Freq: Every day | ORAL | 3 refills | Status: DC

## 2021-06-20 NOTE — Progress Notes (Unsigned)
Enrolled patient for a 14 day Zio XT monitor to be mailed to patients home   Dr Varanasi to read 

## 2021-06-20 NOTE — Patient Instructions (Signed)
Medication Instructions:  Your physician has recommended you make the following change in your medication:    START Imdur 30 mg taking 1 daily   *If you need a refill on your cardiac medications before your next appointment, please call your pharmacy*   Lab Work: TODAY:  BMET, CBC, MAG, & TSH  If you have labs (blood work) drawn today and your tests are completely normal, you will receive your results only by: [place] (if you have MyChart) OR A paper copy in the mail If you have any lab test that is abnormal or we need to change your treatment, we will call you to review the results.   Testing/Procedures: Your physician has requested that you have an echocardiogram. Echocardiography is a painless test that uses sound waves to create images of your heart. It provides your doctor with information about the size and shape of your heart and how well your heart's chambers and valves are working. This procedure takes approximately one hour. There are no restrictions for this procedure.  ZIO XT- Long Term Monitor Instructions  Your physician has requested you wear a ZIO patch monitor for 14 days.  This is a single patch monitor. [employer] supplies one patch monitor per enrollment. Additional stickers are not available. Please do not apply patch if you will be having a Nuclear Stress Test,  Echocardiogram, Cardiac CT, MRI, or Chest Xray during the period you would be wearing the  monitor. The patch cannot be worn during these tests. You cannot remove and re-apply the  ZIO XT patch monitor.  Your ZIO patch monitor will be mailed 3 day USPS to your address on file. It may take 3-5 days  to receive your monitor after you have been enrolled.  Once you have received your monitor, please review the enclosed instructions. Your monitor  has already been registered assigning a specific monitor serial # to you.  Billing and Patient Assistance Program Information  We have supplied [employer] with  any of your insurance information on file for billing purposes. [employer] offers a sliding scale Patient Assistance Program for patients that do not have  insurance, or whose insurance does not completely cover the cost of the ZIO monitor.  You must apply for the Patient Assistance Program to qualify for this discounted rate.  To apply, please call [employer] at [phone], select option 4, select option 2, ask to apply for  Patient Assistance Program. Theodore Demark will ask your household income, and how many people  are in your household. They will quote your out-of-pocket cost based on that information.  [employer] will also be able to set up a 61-month, interest-free payment plan if needed.  Applying the monitor   Shave hair from upper left chest.  Hold abrader disc by orange tab. Rub abrader in 40 strokes over the upper left chest as  indicated in your monitor instructions.  Clean area with 4 enclosed alcohol pads. Let dry.  Apply patch as indicated in monitor instructions. Patch will be placed under collarbone on left  side of chest with arrow pointing upward.  Rub patch adhesive wings for 2 minutes. Remove white label marked "1". Remove the white  label marked "2". Rub patch adhesive wings for 2 additional minutes.  While looking in a mirror, press and release button in center of patch. A small green light will  flash 3-4 times. This will be your only indicator that the monitor has been turned on.  Do not shower for the first 24  hours. You may shower after the first 24 hours.  Press the button if you feel a symptom. You will hear a small click. Record Date, Time and  Symptom in the Patient Logbook.  When you are ready to remove the patch, follow instructions on the last 2 pages of Patient  Logbook. Stick patch monitor onto the last page of Patient Logbook.  Place Patient Logbook in the blue and white box. Use locking tab on box and tape box closed  securely. The blue and white box has prepaid  postage on it. Please place it in the mailbox as  soon as possible. Your physician should have your test results approximately 7 days after the  monitor has been mailed back to [facility].  Call [place] at [phone] if you have questions regarding  your ZIO XT patch monitor. Call them immediately if you see an orange light blinking on your  monitor.  If your monitor falls off in less than 4 days, contact our Monitor department at [phone].  If your monitor becomes loose or falls off after 4 days call [employer] at [phone] for  suggestions on securing your monitor   Your physician has requested that you have a cardiac catheterization. Cardiac catheterization is used to diagnose and/or treat various heart conditions. Doctors may recommend this procedure for a number of different reasons. The most common reason is to evaluate chest pain. Chest pain can be a symptom of coronary artery disease (CAD), and cardiac catheterization can show whether plaque is narrowing or blocking your heart's arteries. This procedure is also used to evaluate the valves, as well as measure the blood flow and oxygen levels in different parts of your heart. For further information please visit [email or web address]. Please follow instruction sheet, BELOW:   [facility] OFFICE [place], [place] [address] Dept: [phone] Loc: [place]  06/20/2021  You are scheduled for a Cardiac Catheterization on Tuesday, December 13 with Dr. Larae Grooms.  1. Please arrive at the [facility] (Main Entrance A) at [facility]: [address] [place], [place] [address] at 7:00 AM (This time is two hours before your procedure to ensure your preparation). Free valet parking service is available.   Special note: Every effort is made to have your procedure done on time. Please  understand that emergencies sometimes delay scheduled procedures.  2. Diet: Do not eat solid foods after midnight.  The patient may have clear liquids until 5am upon the day of the procedure.  3. Labs: You will need to have blood drawn on TODAY  4. Medication instructions in preparation for your procedure:   Contrast Allergy: No  Stop taking Xarelto (Rivaroxaban) on Saturday, December 9.  Take only 0 units of insulin the night before your procedure. Do not take any insulin on the day of the procedure.    On the morning of your procedure, take your Aspirin and Plavix/Clopidogrel and any morning medicines NOT listed above.  You may use sips of water.  5. Plan for one night stay--bring personal belongings. 6. Bring a current list of your medications and current insurance cards. 7. You MUST have a responsible person to drive you home. 8. Someone MUST be with you the first 24 hours after you arrive home or your discharge will be delayed. 9. Please wear clothes that are easy to get on and off and wear slip-on shoes.  Thank you for allowing [place] to care for you!   -- [facility] .    Follow-Up: At [facility], you and your health needs are our priority.  As part of our continuing mission to provide you with exceptional heart care, we have created designated Provider Care Teams.  These Care Teams include your primary Cardiologist (physician) and Advanced Practice Providers (APPs -  Physician Assistants and Nurse Practitioners) who all work together to provide you with the care you need, when you need it.  We recommend signing up for the patient portal called "MyChart".  Sign up information is provided on this After Visit Summary.  MyChart is used to connect with patients for Virtual Visits (Telemedicine).  Patients are able to view lab/test results, encounter notes, upcoming appointments, etc.  Non-urgent messages can be sent to your provider as well.   To  learn more about what you can do with MyChart, go to [email or web address].    Your next appointment:   2 week(s)  07/12/21 ARRIVE AT 11:25  The format for your next appointment:   In Person  Provider:   Larae Grooms, MD     Other Instructions

## 2021-06-21 ENCOUNTER — Telehealth: Payer: Self-pay | Admitting: *Deleted

## 2021-06-21 ENCOUNTER — Telehealth: Payer: Self-pay | Admitting: Interventional Cardiology

## 2021-06-21 LAB — TSH: TSH: 2.46 u[IU]/mL (ref 0.450–4.500)

## 2021-06-21 LAB — CBC
Hematocrit: 39.1 % (ref 34.0–46.6)
Hemoglobin: 13.3 g/dL (ref 11.1–15.9)
MCH: 29.1 pg (ref 26.6–33.0)
MCHC: 34 g/dL (ref 31.5–35.7)
MCV: 86 fL (ref 79–97)
Platelets: 178 10*3/uL (ref 150–450)
RBC: 4.57 x10E6/uL (ref 3.77–5.28)
RDW: 14.3 % (ref 11.7–15.4)
WBC: 7.2 10*3/uL (ref 3.4–10.8)

## 2021-06-21 LAB — BASIC METABOLIC PANEL
BUN/Creatinine Ratio: 18 (ref 9–23)
BUN: 17 mg/dL (ref 6–24)
CO2: 26 mmol/L (ref 20–29)
Calcium: 9.7 mg/dL (ref 8.7–10.2)
Chloride: 97 mmol/L (ref 96–106)
Creatinine, Ser: 0.97 mg/dL (ref 0.57–1.00)
Glucose: 317 mg/dL — ABNORMAL HIGH (ref 70–99)
Potassium: 4.5 mmol/L (ref 3.5–5.2)
Sodium: 139 mmol/L (ref 134–144)
eGFR: 69 mL/min/{1.73_m2} (ref >59)

## 2021-06-21 LAB — MAGNESIUM: Magnesium: 1.8 mg/dL (ref 1.6–2.3)

## 2021-06-21 MED ORDER — MAGNESIUM OXIDE 400 MG PO TABS: 400.0000 mg | ORAL_TABLET | Freq: Every day | ORAL | 3 refills | Status: DC

## 2021-06-21 NOTE — Telephone Encounter (Signed)
Spoke with pt and advised RN spoke with lab personnel who states they are unable to add on A1-C due to needing an additional tube of blood for lab test.  Pt verbalized understanding and thanked Therapist, sports for the callback.

## 2021-06-21 NOTE — Telephone Encounter (Signed)
-----   Message from Charlie Pitter, [place] sent at 06/21/2021  6:19 AM EST ----- Please let pt know labs ok except glucose high in setting of diabetes. Magnesium level is normal but not totally at goal for having history of skips on monitor so would just add MagOx 400mg  daily as well.

## 2021-06-21 NOTE — Telephone Encounter (Signed)
Pt was in the office yesterday to see Melina Copa, pt had lab work, pt wants to know if she can add the A1C to the labwork

## 2021-06-26 ENCOUNTER — Telehealth: Payer: Self-pay | Admitting: *Deleted

## 2021-06-26 NOTE — Telephone Encounter (Addendum)
Cardiac catheterization scheduled at [facility] for: Tuesday June 27, 2021 [facility] Main Entrance A [facility]) at: 7 AM   Diet-no solid food after midnight prior to cath, clear liquids until 5 AM day of procedure.  Medication instructions for procedure: -Hold:  Xarelto-patient reports none 06/24/21 until post procedure  Insulin-AM of procedure/1/2 usual Insulin HS prior to procedure  Lasix-AM of procedure -Except hold medications usual morning medications can be taken pre-cath with sips of water including aspirin 81 mg and Plavix 75 mg.    Confirmed patient has responsible adult to drive home post procedure and be with patient first 24 hours after arriving home.  [facility] does allow one visitor to accompany you and wait in the hospital waiting room while you are there for your procedure. You and your visitor will be asked to wear a mask once you enter the hospital.   Patient reports does not currently have any new symptoms concerning for COVID-19 and no household members with COVID-19 like illness.  Reviewed procedure/mask/visitor instructions with patient.

## 2021-06-27 ENCOUNTER — Other Ambulatory Visit: Payer: Self-pay

## 2021-06-27 ENCOUNTER — Other Ambulatory Visit (HOSPITAL_COMMUNITY): Payer: Self-pay

## 2021-06-27 ENCOUNTER — Encounter (HOSPITAL_COMMUNITY)
Admission: RE | Disposition: A | Discharge status: Home / Self Care | Payer: Self-pay | Source: Home / Self Care | Attending: Interventional Cardiology

## 2021-06-27 ENCOUNTER — Ambulatory Visit (HOSPITAL_COMMUNITY)
Admission: RE | Admit: 2021-06-27 | Discharge: 2021-06-27 | Disposition: A | Discharge status: Home / Self Care | Payer: 59 | Attending: Interventional Cardiology | Admitting: Interventional Cardiology

## 2021-06-27 DIAGNOSIS — E785 Hyperlipidemia, unspecified: Secondary | ICD-10-CM | POA: Insufficient documentation

## 2021-06-27 DIAGNOSIS — E669 Obesity, unspecified: Secondary | ICD-10-CM | POA: Insufficient documentation

## 2021-06-27 DIAGNOSIS — N182 Chronic kidney disease, stage 2 (mild): Secondary | ICD-10-CM | POA: Insufficient documentation

## 2021-06-27 DIAGNOSIS — K219 Gastro-esophageal reflux disease without esophagitis: Secondary | ICD-10-CM | POA: Insufficient documentation

## 2021-06-27 DIAGNOSIS — Z6834 Body mass index (BMI) 34.0-34.9, adult: Secondary | ICD-10-CM | POA: Diagnosis not present

## 2021-06-27 DIAGNOSIS — G4733 Obstructive sleep apnea (adult) (pediatric): Secondary | ICD-10-CM | POA: Insufficient documentation

## 2021-06-27 DIAGNOSIS — M069 Rheumatoid arthritis, unspecified: Secondary | ICD-10-CM | POA: Diagnosis not present

## 2021-06-27 DIAGNOSIS — I25709 Atherosclerosis of coronary artery bypass graft(s), unspecified, with unspecified angina pectoris: Secondary | ICD-10-CM

## 2021-06-27 DIAGNOSIS — I2510 Atherosclerotic heart disease of native coronary artery without angina pectoris: Secondary | ICD-10-CM

## 2021-06-27 DIAGNOSIS — Z7902 Long term (current) use of antithrombotics/antiplatelets: Secondary | ICD-10-CM | POA: Diagnosis not present

## 2021-06-27 DIAGNOSIS — Z794 Long term (current) use of insulin: Secondary | ICD-10-CM | POA: Diagnosis not present

## 2021-06-27 DIAGNOSIS — T82855A Stenosis of coronary artery stent, initial encounter: Secondary | ICD-10-CM | POA: Insufficient documentation

## 2021-06-27 DIAGNOSIS — E1122 Type 2 diabetes mellitus with diabetic chronic kidney disease: Secondary | ICD-10-CM | POA: Insufficient documentation

## 2021-06-27 DIAGNOSIS — I480 Paroxysmal atrial fibrillation: Secondary | ICD-10-CM | POA: Insufficient documentation

## 2021-06-27 DIAGNOSIS — I700 Atherosclerosis of aorta: Secondary | ICD-10-CM | POA: Insufficient documentation

## 2021-06-27 DIAGNOSIS — I25118 Atherosclerotic heart disease of native coronary artery with other forms of angina pectoris: Secondary | ICD-10-CM | POA: Diagnosis not present

## 2021-06-27 DIAGNOSIS — I129 Hypertensive chronic kidney disease with stage 1 through stage 4 chronic kidney disease, or unspecified chronic kidney disease: Secondary | ICD-10-CM | POA: Insufficient documentation

## 2021-06-27 DIAGNOSIS — R42 Dizziness and giddiness: Secondary | ICD-10-CM | POA: Diagnosis not present

## 2021-06-27 DIAGNOSIS — Z7985 Long-term (current) use of injectable non-insulin antidiabetic drugs: Secondary | ICD-10-CM | POA: Diagnosis not present

## 2021-06-27 DIAGNOSIS — I208 Other forms of angina pectoris: Secondary | ICD-10-CM

## 2021-06-27 DIAGNOSIS — I10 Essential (primary) hypertension: Secondary | ICD-10-CM | POA: Diagnosis present

## 2021-06-27 HISTORY — PX: CORONARY STENT INTERVENTION: CATH118234

## 2021-06-27 HISTORY — PX: LEFT HEART CATH AND CORONARY ANGIOGRAPHY: CATH118249

## 2021-06-27 LAB — GLUCOSE, CAPILLARY: Glucose-Capillary: 119 mg/dL — ABNORMAL HIGH (ref 70–99)

## 2021-06-27 LAB — POCT ACTIVATED CLOTTING TIME
Activated Clotting Time: 347 seconds
Activated Clotting Time: 377 seconds

## 2021-06-27 SURGERY — LEFT HEART CATH AND CORONARY ANGIOGRAPHY
Anesthesia: LOCAL | Laterality: Right

## 2021-06-27 MED ORDER — FENTANYL CITRATE (PF) 100 MCG/2ML IJ SOLN
INTRAMUSCULAR | Status: AC
  Filled 2021-06-27: qty 2

## 2021-06-27 MED ORDER — SODIUM CHLORIDE 0.9 % IV SOLN: INTRAVENOUS | Status: DC

## 2021-06-27 MED ORDER — VERAPAMIL HCL 2.5 MG/ML IV SOLN: INTRAVENOUS | Status: DC | PRN

## 2021-06-27 MED ORDER — LABETALOL HCL 5 MG/ML IV SOLN: 10.0000 mg | INTRAVENOUS | Status: DC | PRN

## 2021-06-27 MED ORDER — VERAPAMIL HCL 2.5 MG/ML IV SOLN
INTRAVENOUS | Status: DC | PRN
  Administered 2021-06-27 (×2): 10 mL via INTRA_ARTERIAL

## 2021-06-27 MED ORDER — SODIUM CHLORIDE 0.9% FLUSH: 3.0000 mL | INTRAVENOUS | Status: DC | PRN

## 2021-06-27 MED ORDER — MIDAZOLAM HCL 2 MG/2ML IJ SOLN
INTRAMUSCULAR | Status: AC
  Filled 2021-06-27: qty 2

## 2021-06-27 MED ORDER — HEPARIN SODIUM (PORCINE) 1000 UNIT/ML IJ SOLN
INTRAMUSCULAR | Status: DC | PRN
  Administered 2021-06-27: 5500 [IU] via INTRAVENOUS
  Administered 2021-06-27: 4500 [IU] via INTRAVENOUS

## 2021-06-27 MED ORDER — VERAPAMIL HCL 2.5 MG/ML IV SOLN
INTRAVENOUS | Status: AC
  Filled 2021-06-27: qty 2

## 2021-06-27 MED ORDER — NITROGLYCERIN 0.4 MG SL SUBL
0.4000 mg | SUBLINGUAL_TABLET | SUBLINGUAL | 2 refills | Status: AC | PRN
  Filled 2021-06-27: qty 25, 7d supply, fill #0

## 2021-06-27 MED ORDER — SODIUM CHLORIDE 0.9 % IV SOLN: 250.0000 mL | INTRAVENOUS | Status: DC | PRN

## 2021-06-27 MED ORDER — HEPARIN SODIUM (PORCINE) 1000 UNIT/ML IJ SOLN
INTRAMUSCULAR | Status: AC
  Filled 2021-06-27: qty 10

## 2021-06-27 MED ORDER — NITROGLYCERIN 1 MG/10 ML FOR IR/CATH LAB
INTRA_ARTERIAL | Status: DC | PRN
  Administered 2021-06-27: 200 ug via INTRACORONARY

## 2021-06-27 MED ORDER — LIDOCAINE HCL (PF) 1 % IJ SOLN
INTRAMUSCULAR | Status: AC
  Filled 2021-06-27: qty 30

## 2021-06-27 MED ORDER — LIDOCAINE HCL (PF) 1 % IJ SOLN
INTRAMUSCULAR | Status: DC | PRN
  Administered 2021-06-27: 2 mL via INTRADERMAL

## 2021-06-27 MED ORDER — ACETAMINOPHEN 325 MG PO TABS: 650.0000 mg | ORAL_TABLET | ORAL | Status: DC | PRN

## 2021-06-27 MED ORDER — CLOPIDOGREL BISULFATE 75 MG PO TABS: 75.0000 mg | ORAL_TABLET | Freq: Every day | ORAL | Status: DC

## 2021-06-27 MED ORDER — HEPARIN (PORCINE) IN NACL 1000-0.9 UT/500ML-% IV SOLN
INTRAVENOUS | Status: DC | PRN
  Administered 2021-06-27 (×4): 500 mL

## 2021-06-27 MED ORDER — IOHEXOL 350 MG/ML SOLN
INTRAVENOUS | Status: DC | PRN
  Administered 2021-06-27: 115 mL

## 2021-06-27 MED ORDER — MIDAZOLAM HCL 2 MG/2ML IJ SOLN
INTRAMUSCULAR | Status: DC | PRN
  Administered 2021-06-27 (×4): 1 mg via INTRAVENOUS
  Administered 2021-06-27: 2 mg via INTRAVENOUS

## 2021-06-27 MED ORDER — ASPIRIN 81 MG PO CHEW: 81.0000 mg | CHEWABLE_TABLET | ORAL | Status: DC

## 2021-06-27 MED ORDER — HEPARIN (PORCINE) IN NACL 2000-0.9 UNIT/L-% IV SOLN
INTRAVENOUS | Status: AC
  Filled 2021-06-27: qty 1000

## 2021-06-27 MED ORDER — FENTANYL CITRATE (PF) 100 MCG/2ML IJ SOLN
INTRAMUSCULAR | Status: DC | PRN
  Administered 2021-06-27 (×4): 25 ug via INTRAVENOUS

## 2021-06-27 MED ORDER — HYDRALAZINE HCL 20 MG/ML IJ SOLN: 10.0000 mg | INTRAMUSCULAR | Status: DC | PRN

## 2021-06-27 MED ORDER — RIVAROXABAN 20 MG PO TABS
ORAL_TABLET | ORAL | 0 refills | Status: DC
  Filled 2021-06-27: qty 30, 30d supply, fill #0
  Filled 2021-06-27: qty 30, 30d supply, fill #1

## 2021-06-27 MED ORDER — SODIUM CHLORIDE 0.9 % WEIGHT BASED INFUSION: 1.0000 mL/kg/h | INTRAVENOUS | Status: DC

## 2021-06-27 MED ORDER — SODIUM CHLORIDE 0.9 % WEIGHT BASED INFUSION
3.0000 mL/kg/h | INTRAVENOUS | Status: AC
  Administered 2021-06-27: 3 mL/kg/h via INTRAVENOUS

## 2021-06-27 MED ORDER — ONDANSETRON HCL 4 MG/2ML IJ SOLN: 4.0000 mg | Freq: Four times a day (QID) | INTRAMUSCULAR | Status: DC | PRN

## 2021-06-27 MED ORDER — NITROGLYCERIN 1 MG/10 ML FOR IR/CATH LAB
INTRA_ARTERIAL | Status: AC
  Filled 2021-06-27: qty 10

## 2021-06-27 MED ORDER — SODIUM CHLORIDE 0.9% FLUSH: 3.0000 mL | Freq: Two times a day (BID) | INTRAVENOUS | Status: DC

## 2021-06-27 MED ORDER — RIVAROXABAN 20 MG PO TABS
ORAL_TABLET | ORAL | 0 refills | Status: DC
  Filled 2021-06-27: qty 30, 30d supply, fill #0

## 2021-06-27 SURGICAL SUPPLY — 17 items
BALLN SAPPHIRE ~~LOC~~ 3.25X12 (BALLOONS) ×2 IMPLANT
BALLN SCOREFLEX 2.50X10 (BALLOONS) ×4
BALLOON SCOREFLEX 2.50X10 (BALLOONS) IMPLANT
CATH 5FR JL3.5 JR4 ANG PIG MP (CATHETERS) ×2 IMPLANT
CATH VISTA GUIDE 6FR XBRCA (CATHETERS) ×2 IMPLANT
DEVICE RAD COMP TR BAND LRG (VASCULAR PRODUCTS) ×2 IMPLANT
GLIDESHEATH SLEND SS 6F .021 (SHEATH) ×2 IMPLANT
GUIDEWIRE INQWIRE 1.5J.035X260 (WIRE) IMPLANT
INQWIRE 1.5J .035X260CM (WIRE) ×4
KIT ENCORE 26 ADVANTAGE (KITS) ×2 IMPLANT
KIT HEART LEFT (KITS) ×4 IMPLANT
KIT HEMO VALVE WATCHDOG (MISCELLANEOUS) ×2 IMPLANT
PACK CARDIAC CATHETERIZATION (CUSTOM PROCEDURE TRAY) ×4 IMPLANT
STENT ONYX FRONTIER 2.5X15 (Permanent Stent) ×2 IMPLANT
TRANSDUCER W/STOPCOCK (MISCELLANEOUS) ×4 IMPLANT
TUBING CIL FLEX 10 FLL-RA (TUBING) ×4 IMPLANT
WIRE ASAHI PROWATER 180CM (WIRE) ×2 IMPLANT

## 2021-06-27 NOTE — Discharge Summary (Addendum)
Discharge Summary for Same Day PCI   Patient ID: Courtney Olson MRN: [id number]; DOB: 16-Dec-1965  Admit date: 06/27/2021 Discharge date: 06/27/2021  Primary Care Provider: Orpah Melter, MD  Primary Cardiologist: Larae Grooms, MD  Primary Electrophysiologist:  None   Discharge Diagnoses    Principal Problem:   Coronary artery disease Active Problems:   Hyperlipidemia   Hypertension    Diagnostic Studies/Procedures    Cardiac Catheterization 06/27/2021:   Dist RCA lesion is 25% stenosed.   Prox RCA lesion is 25% stenosed.   2nd Diag lesion is 60% stenosed.   1st Diag lesion is 60% stenosed.   Mid RCA-2 lesion is 90% stenosed.  This lesion was partially inside the stent, just at the distal edge.   A drug-eluting stent was successfully placed using a STENT ONYX FRONTIER 2.5X15, deployed at high pressure at 2.75 mm.  It was then postdilated to 3.25 mm.   Post intervention, there is a 0% residual stenosis.   Ost Cx to Mid Cx lesion is 25% stenosed.  Diffusely calcified vessel   Non-stenotic Mid RCA-1 lesion was previously treated.  The proximal to midportion of the previously placed stent was widely patent.   The left ventricular systolic function is normal.   LV end diastolic pressure is normal.   The left ventricular ejection fraction is 55-65% by visual estimate.   There is no aortic valve stenosis.   XB RCA guide provided excellent support for delivery of stent into the RCA.  This was a much more supportive guide than JR4 which was used back in 2020.  Successful treatment of in-stent restenosis and partially Denovo lesion in the mid to distal RCA.  Resume Xarelto tomorrow.  Continue clopidogrel.  She does have diffuse calcification in her left system as well, likely related to diabetes.  Continue aggressive risk factor modification including lipid-lowering therapy.   Results were conveyed to Delos Haring [phone]  Diagnostic Dominance:  Right Intervention   _____________   History of Present Illness     Courtney Olson is a 55 y.o. female with history of HTN, DM, paroxysmal atrial fibrillation, multivessel CAD s/p DES to [facility] 05/2019, recurrent vasovagal syncope, pancolitis, calcifications of the abdominal aorta by CT, CMV hepatitis, hyperlipidemia, OSA, RA, GERD, obesity, bradycardia, pancreatitis, CKD stage 2 by labs who presented for follow up on 12/6 with Dayna Dunn.    She had prior reassuring cardiac testing in 2014 and 2015 but went on to develop atrial fib and CAD in later years. In 2015 an event monitor demonstrated PAF so she was placed on anticoagulation. She also has a history of recurrent syncope. She has a history of pancreatitis and kidney mass and underwent a large resection of a benign mass complicated by multiple infections. In July 2015 she had an episode of syncope following severe abdominal pain, felt vasovagal in nature. She had actually reported >40 episodes of syncope in the last 10 years. These initially began in the setting of anaphylactic reactions but in more recent years had followed acute abdominal pain related to her ulcerative colitis. It has been felt that prevention of the GI pain was the key to avoiding further syncope, suspected vasovagal. Cardiac monitor 05/2016 showed NSR (HR 51-127bpm), average HR 73bpm, isolated atrial couplet. She was seen by Dr. Radford Pax in 2020 for dyspnea on exertion. 2D echo 05/06/19 showed EF 60-65%, impaired diastolic relaxation, no acute abnormalities. Coronary CTA 05/13/19 was abnormal prompting cardiac cath as below s/p DES to RCA, with residual disease  treated medically. LE arterial testing 11/2019 showed calcified vessels but no significant obstructive disease. Her last nuc stress test for a myriad of symptoms 03/2020 was normal. She's had several subsequent monitors. Event monitor 04/2020 showed average HR 71, range 46-121, occasional PVCs, bigeminal PVCs, ventricular  couplets and nonsustained ventricular tachycardia up to 6 beats (PVC load <1%), occasional PACs and nonsustained atrial tachycardia. Metoprolol was increased. She was last seen in the office 02/2021 with intermittent palpitations. Repeat event monitor 03/2021 showed NSR with rare PACs/PVCs, brief rare runs of consecutive PACs, PVCs, bigeminy, but no atrial fibrillation. She has history of diastolic dysfunction as outlined above but no clinical heart failure (LVEDP normal in 2020).   She was seen back for follow-up reporting she was very concerned about two issues that have been present for about 7 weeks: - Episodic mid back pain that reminds her of prior angina - distinctly recalls this was present for months before her PCI and had resolved completely after stenting. She has been having this about once every 5 days - can happen with exertion but also happened laying in bed or riding in the car. Has lasted minutes or hours. Has taken SL NTG before with relief. No associated sx or particular triggers.  - Episodic lightheadedness - about once every 5-6 days, mostly with exertion but sometimes when just standing doing nothing at all. Has to brace herself. Almost reminds her of a panic attack. Once she steadies herself she feels OK. Feels like she cannot take a deep breath when this is happening. No full syncope.  Her symptoms were reviewed with Dr. Irish Lack who agreed to send for outpatient cardiac catheterization.     Hospital Course     The patient underwent cardiac cath as noted above with mRCA of 90% treated with PCI/DES x1. Residual dRCA 25%, pRCA 25%, 2nd diag 60%, 1st diag 60% to be treated medically. Plan for DAPT with plavix as monotherapy given the need for Xarelto for at least one year. The patient was seen by cardiac rehab while in short stay. There were no observed complications post cath. Radial cath site was re-evaluated prior to discharge and found to be stable without any complications.  Instructions/precautions regarding cath site care were given prior to discharge.  [name] was seen by Dr. Irish Lack and determined stable for discharge home. Follow up with our office has been arranged. Medications are listed below. Pertinent changes include N/a .  _____________  Cath/PCI Registry Performance & Quality Measures: Aspirin prescribed? - No - monotherapy with need for Xarelto ADP Receptor Inhibitor (Plavix/Clopidogrel, Brilinta/Ticagrelor or Effient/Prasugrel) prescribed (includes medically managed patients)? - Yes High Intensity Statin (Lipitor 40-64m or Crestor 20-428m prescribed? - Yes For EF <40%, was ACEI/ARB prescribed? - Not Applicable (EF >/= 4078%For EF <40%, Aldosterone Antagonist (Spironolactone or Eplerenone) prescribed? - Not Applicable (EF >/= 4029%Cardiac Rehab Phase II ordered (Included Medically managed Patients)? - Yes  _____________   Discharge Vitals Blood pressure (!) 119/59, pulse 66, temperature 98.5 F (36.9 C), temperature source Oral, resp. rate 11, height _0  (1.651 m), weight 93 kg, last menstrual period 07/16/2000, SpO2 98 %.  Filed Weights   06/27/21 0716  Weight: 93 kg    Last Labs & Radiologic Studies    CBC No results for input(s): WBC, NEUTROABS, HGB, HCT, MCV, PLT in the last 72 hours. Basic Metabolic Panel No results for input(s): NA, K, CL, CO2, GLUCOSE, BUN, CREATININE, CALCIUM, MG, PHOS in the last 72 hours.  Liver Function Tests No results for input(s): AST, ALT, ALKPHOS, BILITOT, PROT, ALBUMIN in the last 72 hours. No results for input(s): LIPASE, AMYLASE in the last 72 hours. High Sensitivity Troponin:   No results for input(s): TROPONINIHS in the last 720 hours.  BNP Invalid input(s): POCBNP D-Dimer No results for input(s): DDIMER in the last 72 hours. Hemoglobin A1C No results for input(s): HGBA1C in the last 72 hours. Fasting Lipid Panel No results for input(s): CHOL, HDL, LDLCALC, TRIG, CHOLHDL, LDLDIRECT  in the last 72 hours. Thyroid Function Tests No results for input(s): TSH, T4TOTAL, T3FREE, THYROIDAB in the last 72 hours.  Invalid input(s): FREET3 _____________  CARDIAC CATHETERIZATION  Result Date: 06/27/2021   Dist RCA lesion is 25% stenosed.   Prox RCA lesion is 25% stenosed.   2nd Diag lesion is 60% stenosed.   1st Diag lesion is 60% stenosed.   Mid RCA-2 lesion is 90% stenosed.  This lesion was partially inside the stent, just at the distal edge.   A drug-eluting stent was successfully placed using a STENT ONYX FRONTIER 2.5X15, deployed at high pressure at 2.75 mm.  It was then postdilated to 3.25 mm.   Post intervention, there is a 0% residual stenosis.   Ost Cx to Mid Cx lesion is 25% stenosed.  Diffusely calcified vessel   Non-stenotic Mid RCA-1 lesion was previously treated.  The proximal to midportion of the previously placed stent was widely patent.   The left ventricular systolic function is normal.   LV end diastolic pressure is normal.   The left ventricular ejection fraction is 55-65% by visual estimate.   There is no aortic valve stenosis. XB RCA guide provided excellent support for delivery of stent into the RCA.  This was a much more supportive guide than JR4 which was used back in 2020. Successful treatment of in-stent restenosis and partially Denovo lesion in the mid to distal RCA.  Resume Xarelto tomorrow.  Continue clopidogrel.  She does have diffuse calcification in her left system as well, likely related to diabetes.  Continue aggressive risk factor modification including lipid-lowering therapy. Results were conveyed to [place], [phone]    Disposition   Pt is being discharged home today in good condition.  Follow-up Plans & Appointments     Follow-up Information     Jettie Booze, MD Follow up on 07/12/2021.   Specialties: Cardiology, Radiology, Interventional Cardiology Why: at 11:40am for your follow up appt Contact information: [address] [place] [address] [phone]                Discharge Instructions     AMB Referral to Cardiac Rehabilitation - Phase II   Complete by: As directed    Diagnosis: Coronary Stents   After initial evaluation and assessments completed: Virtual Based Care may be provided alone or in conjunction with Phase 2 Cardiac Rehab based on patient barriers.: Yes        Discharge Medications   Allergies as of 06/27/2021       Reactions   Orange Fruit [citrus] Anaphylaxis   Other Rash, Anaphylaxis   Skin irritation, burning and skin tearing, paper tape is good to use. Please do not remove the tape on IV at this point. Other reaction(s): Other Skin irritation, burning and skin tearing, paper tape is good to use. Please do not remove the tape on IV at this point.   Bactrim [sulfamethoxazole-trimethoprim] Rash   Tape Rash  Medication List     TAKE these medications    ALPRAZolam 0.25 MG tablet Commonly known as: XANAX Take 1 tablet (0.25 mg total) by mouth 2 (two) times daily as needed. What changed: reasons to take this   atorvastatin 40 MG tablet Commonly known as: LIPITOR TAKE 1 TABLET(40 MG) BY MOUTH DAILY   clopidogrel 75 MG tablet Commonly known as: PLAVIX Take 1 tablet (75 mg total) by mouth daily with breakfast.   Dulaglutide 4.5 MG/0.5ML Sopn Inject 4.5 mg into the skin every 7 (seven) days.   EPINEPHrine 0.3 mg/0.3 mL Soaj injection Commonly known as: EPI-PEN Inject 0.3 mLs into the skin as needed.   Estradiol 10 MCG Tabs vaginal tablet Place one tablet vaginal qhs x 1 week, then change to 2 x a week.   ezetimibe 10 MG tablet Commonly known as: ZETIA Take 1 tablet (10 mg total) by mouth daily.   furosemide 20 MG tablet Commonly known as: LASIX Take 20 mg by mouth as needed for fluid or edema.   insulin lispro 100 UNIT/ML injection Commonly known as: HUMALOG Inject 8 Units into the skin 2 (two) times daily.   isosorbide mononitrate 30  MG 24 hr tablet Commonly known as: IMDUR Take 1 tablet (30 mg total) by mouth daily.   magnesium oxide 400 MG tablet Commonly known as: MAG-OX Take 1 tablet (400 mg total) by mouth daily.   mesalamine 1.2 g EC tablet Commonly known as: LIALDA Take 1.2 g by mouth daily.   metoprolol succinate 25 MG 24 hr tablet Commonly known as: TOPROL-XL Take 1 tablet (25 mg total) by mouth daily.   multivitamin tablet Take 1 tablet by mouth daily.   nitroGLYCERIN 0.4 MG SL tablet Commonly known as: Nitrostat Place 1 tablet (0.4 mg total) under the tongue every 5 (five) minutes as needed.   pantoprazole 40 MG tablet Commonly known as: PROTONIX TAKE 1 TABLET(40 MG) BY MOUTH DAILY   ramipril 2.5 MG capsule Commonly known as: ALTACE Take 1 capsule (2.5 mg total) by mouth daily.   rivaroxaban 20 MG Tabs tablet Commonly known as: Xarelto TAKE 1 TABLET(20 MG) BY MOUTH DAILY   Toujeo Max SoloStar 300 UNIT/ML Solostar Pen Generic drug: insulin glargine (2 Unit Dial) Inject 14 Units into the skin 2 (two) times daily.   triamcinolone cream 0.1 % Commonly known as: KENALOG Apply 1 application topically daily as needed (Rash).   VITAMIN D3 PO Take 1 capsule by mouth daily.           Allergies Allergies  Allergen Reactions   Orange Fruit [Citrus] Anaphylaxis   Other Rash and Anaphylaxis    Skin irritation, burning and skin tearing, paper tape is good to use. Please do not remove the tape on IV at this point. Other reaction(s): Other Skin irritation, burning and skin tearing, paper tape is good to use. Please do not remove the tape on IV at this point.   Bactrim [Sulfamethoxazole-Trimethoprim] Rash   Tape Rash    Outstanding Labs/Studies   N/a   Duration of Discharge Encounter   Greater than 30 minutes including physician time.  Signed, Reino Bellis, NP 06/27/2021, 1:24 PM  I have examined the patient and reviewed assessment and plan and discussed with patient.  Agree  with above as stated.  Successful PCI of the mid to distal RCA.  Continue clopidogrel.  Resume anticoagulation tomorrow.  Radial precautions given.  Radial site is soft with palpable pulse.  Diffusely calcific coronary arteries.  This, along with diabetes, can increase her risk of restenosis going forward.  Could consider intravascular lithotripsy if she had further restenosis.  Larae Grooms

## 2021-06-27 NOTE — Progress Notes (Signed)
CARDIAC REHAB PHASE I   Stent education completed with pt and spouse. Pt educated on importance of Plavix, Xerelto, NTG, and statin. Pt given stent card along with heart healthy and diabetic diets. Reviewed site care, restrictions, and exercise guidelines. Will refer to CRP II Chester Rufina Falco, RN BSN 06/27/2021 1:17 PM

## 2021-06-27 NOTE — Interval H&P Note (Signed)
Cath Lab Visit (complete for each Cath Lab visit)  Clinical Evaluation Leading to the Procedure:   ACS: No.  Non-ACS:    Anginal Classification: CCS III  Anti-ischemic medical therapy: Minimal Therapy (1 class of medications)  Non-Invasive Test Results: No non-invasive testing performed  Prior CABG: No previous CABG      History and Physical Interval Note:  06/27/2021 9:11 AM  Courtney Olson  has presented today for surgery, with the diagnosis of angina.  The various methods of treatment have been discussed with the patient and family. After consideration of risks, benefits and other options for treatment, the patient has consented to  Procedure(s): LEFT HEART CATH AND CORONARY ANGIOGRAPHY (N/A) as a surgical intervention.  The patient's history has been reviewed, patient examined, no change in status, stable for surgery.  I have reviewed the patient's chart and labs.  Questions were answered to the patient's satisfaction.     Larae Grooms

## 2021-06-28 ENCOUNTER — Encounter (HOSPITAL_COMMUNITY): Payer: Self-pay | Admitting: Interventional Cardiology

## 2021-06-30 ENCOUNTER — Telehealth (HOSPITAL_COMMUNITY): Payer: Self-pay

## 2021-06-30 DIAGNOSIS — I493 Ventricular premature depolarization: Secondary | ICD-10-CM

## 2021-06-30 DIAGNOSIS — I480 Paroxysmal atrial fibrillation: Secondary | ICD-10-CM | POA: Diagnosis not present

## 2021-06-30 DIAGNOSIS — R42 Dizziness and giddiness: Secondary | ICD-10-CM

## 2021-06-30 DIAGNOSIS — I491 Atrial premature depolarization: Secondary | ICD-10-CM | POA: Diagnosis not present

## 2021-06-30 NOTE — Telephone Encounter (Signed)
Pt is not interested in the cardiac rehab program. Closed referral 

## 2021-07-03 ENCOUNTER — Telehealth: Payer: Self-pay | Admitting: Interventional Cardiology

## 2021-07-03 NOTE — Telephone Encounter (Signed)
Left message to call office

## 2021-07-03 NOTE — Telephone Encounter (Signed)
Courtney Olson is calling stating Saturday she woke up with a knot near the entry sight from her procedure with Dr. Irish Lack. She states she has also been waking up the past two nights with right arm pain. She reports she has been having a headache and recently hit her head the day before thanksgiving causing it to hurt for two days, but headaches started again day after procedure.

## 2021-07-03 NOTE — Telephone Encounter (Signed)
I spoke with patient. She reports very small knot next to cath site on right arm. Also has been having some right arm pain since cath.  Describes as an ache.  Took Ibuprofen with some improvement. Pain worse at night.  Right arm is bruised.  Headaches recently since starting Imdur. I told patient knot should improve but to let us know if it worsens.  I advised her to take tylenol if needed for headache and arm pain and to avoid Ibuprofen.  Patient is scheduled to see Dr Irish Lack on 12/28.

## 2021-07-07 ENCOUNTER — Ambulatory Visit (HOSPITAL_COMMUNITY): Payer: 59 | Attending: Cardiology

## 2021-07-07 ENCOUNTER — Other Ambulatory Visit: Payer: Self-pay

## 2021-07-07 DIAGNOSIS — I493 Ventricular premature depolarization: Secondary | ICD-10-CM | POA: Diagnosis present

## 2021-07-07 DIAGNOSIS — I2510 Atherosclerotic heart disease of native coronary artery without angina pectoris: Secondary | ICD-10-CM | POA: Insufficient documentation

## 2021-07-07 DIAGNOSIS — I491 Atrial premature depolarization: Secondary | ICD-10-CM | POA: Insufficient documentation

## 2021-07-07 DIAGNOSIS — E785 Hyperlipidemia, unspecified: Secondary | ICD-10-CM | POA: Diagnosis not present

## 2021-07-07 DIAGNOSIS — I208 Other forms of angina pectoris: Secondary | ICD-10-CM | POA: Insufficient documentation

## 2021-07-07 DIAGNOSIS — R42 Dizziness and giddiness: Secondary | ICD-10-CM | POA: Diagnosis not present

## 2021-07-07 DIAGNOSIS — I480 Paroxysmal atrial fibrillation: Secondary | ICD-10-CM | POA: Diagnosis present

## 2021-07-07 DIAGNOSIS — I10 Essential (primary) hypertension: Secondary | ICD-10-CM | POA: Insufficient documentation

## 2021-07-07 LAB — ECHOCARDIOGRAM COMPLETE
Area-P 1/2: 2.99 cm2
S' Lateral: 2.7 cm

## 2021-07-10 NOTE — Progress Notes (Signed)
Cardiology Office Note   Date:  07/12/2021   ID:  Courtney Olson 1965-11-25, MRN [id number]  PCP:  Orpah Melter, MD    No chief complaint on file.  CAD  Wt Readings from Last 3 Encounters:  07/12/21 208 lb 3.2 oz (94.4 kg)  06/27/21 205 lb (93 kg)  06/20/21 207 lb (93.9 kg)       History of Present Illness: Courtney Olson is a 55 y.o. female    with a history of HTN, DM, paroxysmal atrial fibrillation on Xarelto, vasovagal syncope, hyperlipidemia, OSA, obesity, bradycardia, and CAD.     Father with CABG at age 99.     She has lost > 100 lbs in the past. Walking long distances.     She has a remote history of chest pain with prior stress testing in 07/2012 that was found to be  normal.  Echocardiogram 03/2014 showed normal EF, G1 DD. ETT in 04/2014 was normal. She has had pancreatitis and was diagnosed with a kidney mass and underwent a large resection of a benign mass complicated by multiple infections. In 01/2014 she had an episode of syncope following severe abdominal pain, felt to be vasovagal in etiology. Acute abdominal pain related to her ulcerative colitis felt to be initiator. Cardiac monitor in  05/2016 showed NSR (sinus brady-sinus tach 51-127bpm), average HR 73bpm, isoalted atrial couplet.    COVID infection in Jan 2020.    She was seen by Dr. Radford Pax in 04/2019 with DOE. Echo on 05/06/19 showed EF 60-65%, impaired diastolic relaxation, no acute abnormalities. BNP 05/07/19 at was mildly elevated at 250 therefore HCTZ was stopped and she was started on Lasix. Coronary CTA 05/13/19 was abnormal with concern for CTO of the RCA and moderate prox-mid LAD disease. Dr. Radford Pax recommended cardiac catheterization. LHC then showed mRCA lesion of 80% treated with PCI/DES x1. She does have known residual disease in 1st/2nd diag of 60% but noted to be small vessels. pRCA and dRCA with 25% stenosis. Plan was for medical therapy for residual disease with Plavix only given the need  for Xarelto for PAF.     s/p RCA stent.  Tortuous RCA vessel which would require XB RCA type guide for backup if PCI was needed.   Stress test performed 04/07/2020 which showed a normal stress nuclear study with no ischemia or infarct with an LVEF at 75% and normal wall motion.     03/2020 ZIO monitor showed sinus bradycardia, normal sinus rhythm and sinus tachycardia with occasional PVCs therefore Toprol was increased.     Cardiac rehab was cancelled with COVID.    Cath done on 06/2021 showed: "Mid RCA-2 lesion is 90% stenosed.  This lesion was partially inside the stent, just at the distal edge.   A drug-eluting stent was successfully placed using a STENT ONYX FRONTIER 2.5X15, deployed at high pressure at 2.75 mm.  It was then postdilated to 3.25 mm.   Post intervention, there is a 0% residual stenosis.   Ost Cx to Mid Cx lesion is 25% stenosed.  Diffusely calcified vessel   Non-stenotic Mid RCA-1 lesion was previously treated.  The proximal to midportion of the previously placed stent was widely patent.   The left ventricular systolic function is normal.   LV end diastolic pressure is normal.   The left ventricular ejection fraction is 55-65% by visual estimate.   There is no aortic valve stenosis.   XB RCA guide provided excellent support for delivery of  stent into the RCA.  This was a much more supportive guide than JR4 which was used back in 2020.  Successful treatment of in-stent restenosis and partially Denovo lesion in the mid to distal RCA.  Resume Xarelto tomorrow.  Continue clopidogrel.  She does have diffuse calcification in her left system as well, likely related to diabetes.  Continue aggressive risk factor modification including lipid-lowering therapy."  Dec 2022 echo showed: "Left ventricular ejection fraction, by estimation, is 60 to 65%. The left ventricle has normal function. The left ventricle has no regional wall motion abnormalities. Left ventricular diastolic  parameters were normal.  2. Right ventricular systolic function is normal. The right ventricular size is normal.  3. The mitral valve is normal in structure. No evidence of mitral valve regurgitation. No evidence of mitral stenosis.  4. The aortic valve is tricuspid. There is mild calcification of the aortic valve. There is mild thickening of the aortic valve. Aortic valve regurgitation is not visualized. Aortic valve sclerosis is present, with no evidence of aortic valve stenosis.  5. The inferior vena cava is normal in size with greater than 50% respiratory variability, suggesting right atrial pressure of 3 mmHg.  6. Cannot exclude a small PFO.   Comparison(s): No significant change from prior study."  Past Medical History:  Diagnosis Date   Aortic calcification (HCC)    Atrial tachycardia (HCC)    Atrophic vaginitis 01/2006   Bradycardia 05/2014   CAD (coronary artery disease)    cardiac cath 05/21/2019 showing 805 mRCA, 60% D1 and D2 that were very small and patent LAD and LCx.  She underwent DES to the [facility] with residual 25% stenosis in the dRCA   Chronic pancolonic ulcerative colitis ([place]) 2015   unknown if chronic.   CKD (chronic kidney disease), stage II    Cytomegaloviral hepatitis ([place]) 2005   Diabetes mellitus without complication ([place]) 74/2595   on Ace inhibitor to protect kidneys   Gallstones 01/2013   GERD (gastroesophageal reflux disease)    Hyperlipidemia    Hypertension    Kidney tumor 03/2013   Right   NSVT (nonsustained ventricular tachycardia)    Obesity    OSA (obstructive sleep apnea)    PAF (paroxysmal atrial fibrillation) ([place])    Pancreatitis 2014   Postmenopausal    Premature atrial contractions    PVC's (premature ventricular contractions)    RA (rheumatoid arthritis) ([place])    Syncope and collapse    Ulcerative colitis ([place]) 2015    Past Surgical History:  Procedure Laterality Date   CESAREAN SECTION     CHOLECYSTECTOMY  01/2013   COLONOSCOPY   2015   Colitis   CORONARY STENT INTERVENTION N/A 05/21/2019   Procedure: CORONARY STENT INTERVENTION;  Surgeon: Jettie Booze, MD;  Location: [facility];  Service: Cardiovascular;  Laterality: N/A;   CORONARY STENT INTERVENTION Right 06/27/2021   Procedure: CORONARY STENT INTERVENTION;  Surgeon: Jettie Booze, MD;  Location: [facility];  Service: Cardiovascular;  Laterality: Right;   DILATION AND CURETTAGE OF UTERUS  2002   HERNIA REPAIR  01/2013   Kidney and Bladder Stent and tube placed Right 03/2013   LEFT HEART CATH AND CORONARY ANGIOGRAPHY N/A 06/27/2021   Procedure: LEFT HEART CATH AND CORONARY ANGIOGRAPHY;  Surgeon: Jettie Booze, MD;  Location: [facility];  Service: Cardiovascular;  Laterality: N/A;   PARTIAL NEPHRECTOMY Right 02/2013   Tumor Removal    Procedure to remove kidney and bladder  stent and tube Right 04/2013   RIGHT/LEFT HEART CATH AND CORONARY ANGIOGRAPHY N/A 05/21/2019   Procedure: RIGHT/LEFT HEART CATH AND CORONARY ANGIOGRAPHY;  Surgeon: Jettie Booze, MD;  Location: [facility];  Service: Cardiovascular;  Laterality: N/A;   TOTAL ABDOMINAL HYSTERECTOMY W/ BILATERAL SALPINGOOPHORECTOMY  2002   Secondary to AUB     Current Outpatient Medications  Medication Sig Dispense Refill   ALPRAZolam (XANAX) 0.25 MG tablet Take 1 tablet (0.25 mg total) by mouth 2 (two) times daily as needed. (Patient taking differently: Take 0.25 mg by mouth 2 (two) times daily as needed (flying only).) 15 tablet 0   amoxicillin (AMOXIL) 875 MG tablet Take 875 mg by mouth 2 (two) times daily.     atorvastatin (LIPITOR) 40 MG tablet TAKE 1 TABLET(40 MG) BY MOUTH DAILY 90 tablet 3   Cholecalciferol (VITAMIN D3 PO) Take 1 capsule by mouth daily.     clopidogrel (PLAVIX) 75 MG tablet Take 1 tablet (75 mg total) by mouth daily with breakfast. 90 tablet 3   Dulaglutide 4.5 MG/0.5ML SOPN Inject 4.5 mg into the skin every 7 (seven) days.      EPINEPHrine 0.3 mg/0.3 mL IJ SOAJ injection Inject 0.3 mLs into the skin as needed.  2   Estradiol 10 MCG TABS vaginal tablet Place one tablet vaginal qhs x 1 week, then change to 2 x a week. 24 tablet 4   ezetimibe (ZETIA) 10 MG tablet Take 1 tablet (10 mg total) by mouth daily. 90 tablet 3   furosemide (LASIX) 20 MG tablet Take 20 mg by mouth as needed for fluid or edema.     insulin glargine, 2 Unit Dial, (TOUJEO MAX SOLOSTAR) 300 UNIT/ML Solostar Pen Inject 14 Units into the skin 2 (two) times daily.     insulin lispro (HUMALOG) 100 UNIT/ML injection Inject 8 Units into the skin 2 (two) times daily.     isosorbide mononitrate (IMDUR) 30 MG 24 hr tablet Take 1 tablet (30 mg total) by mouth daily. 90 tablet 3   magnesium oxide (MAG-OX) 400 MG tablet Take 1 tablet (400 mg total) by mouth daily. 90 tablet 3   mesalamine (LIALDA) 1.2 g EC tablet Take 1.2 g by mouth daily.      metoprolol succinate (TOPROL-XL) 25 MG 24 hr tablet Take 1 tablet (25 mg total) by mouth daily. 90 tablet 3   Multiple Vitamin (MULTIVITAMIN) tablet Take 1 tablet by mouth daily.     nitroGLYCERIN (NITROSTAT) 0.4 MG SL tablet Place 1 tablet (0.4 mg total) under the tongue every 5 (five) minutes as needed. 25 tablet 2   pantoprazole (PROTONIX) 40 MG tablet TAKE 1 TABLET(40 MG) BY MOUTH DAILY 90 tablet 3   ramipril (ALTACE) 2.5 MG capsule Take 1 capsule (2.5 mg total) by mouth daily. 90 capsule 3   rivaroxaban (XARELTO) 20 MG TABS tablet TAKE 1 TABLET(20 MG) BY MOUTH DAILY 90 tablet 0   triamcinolone (KENALOG) 0.1 % Apply 1 application topically daily as needed (Rash).     No current facility-administered medications for this visit.    Allergies:   Orange fruit [citrus], Other, Empagliflozin, Metformin hcl, Nitrofurantoin, Bactrim [sulfamethoxazole-trimethoprim], and Tape    Social History:  The patient  reports that she has never smoked. She has never used smokeless tobacco. She reports current alcohol use. She reports  that she does not use drugs.   Family History:  The patient's family history includes Breast cancer (age of onset: 58) in her sister;  Cancer in her maternal grandmother and paternal aunt; Diabetes in her father, maternal grandmother, mother, and paternal aunt; Fibromyalgia in her mother; Heart disease in her father; Hypertension in her father, mother, and sister.    ROS:  Please see the history of present illness.   Otherwise, review of systems are positive for right arm pain.   All other systems are reviewed and negative.    PHYSICAL EXAM: VS:  BP (!) 145/92 (BP Location: Left Arm, Patient Position: Sitting, Cuff Size: Large)    Pulse 71    Ht 5\' 5"  (1.651 m)    Wt 208 lb 3.2 oz (94.4 kg)    LMP 07/16/2000 (Approximate)    SpO2 99%    BMI 34.65 kg/m  , BMI Body mass index is 34.65 kg/m. GEN: Well nourished, well developed, in no acute distress HEENT: normal Neck: no JVD, carotid bruits, or masses Cardiac: RRR; no murmurs, rubs, or gallops,no edema  Respiratory:  clear to auscultation bilaterally, normal work of breathing GI: soft, nontender, nondistended, + BS MS: no deformity or atrophy; 2+ right radial pulse, minimal bruising in right arm Skin: warm and dry, no rash Neuro:  Strength and sensation are intact Psych: euthymic mood, full affect    Recent Labs: 08/09/2020: NT-Pro BNP 251 10/06/2020: ALT 38 06/20/2021: BUN 17; Creatinine, Ser 0.97; Hemoglobin 13.3; Magnesium 1.8; Platelets 178; Potassium 4.5; Sodium 139; TSH 2.460   Lipid Panel    Component Value Date/Time   CHOL 128 10/06/2020 1217   TRIG 115 10/06/2020 1217   HDL 58 10/06/2020 1217   CHOLHDL 2.2 10/06/2020 1217   CHOLHDL 4 05/17/2014 1113   VLDL 34.4 05/17/2014 1113   LDLCALC 49 10/06/2020 1217     Other studies Reviewed: Additional studies/ records that were reviewed today with results demonstrating: echo results reviewed.   ASSESSMENT AND PLAN:  CAD: Clopidogrel monotherapy post PCI.  No angina. Continue  aggressive secondary prevention.  Radial artery appears intact.  She had some swelling initially, but this has resolved.  Pulses intact. HTN: The current medical regimen is effective;  continue present plan and medications. Hyperlipidemia: LDL 50 in 06/2021.  DM: A1C 9.2. Whole food, plant based diet.  Dietary indiscretion on cruise prior to blood test.   PAF: Xarelto for stroke prevention.  No aspirin post stent. Explained possible PFO finding on echo.  No need for further w/u, especially since already on anticoagulation.  PAC/PVC: Wearing Zio patch.  Right arm pain: concerned she refractured this. Will plan for xray.     Current medicines are reviewed at length with the patient today.  The patient concerns regarding her medicines were addressed.  The following changes have been made:  No change  Labs/ tests ordered today include:   Orders Placed This Encounter  Procedures   DG Forearm Right    Recommend 150 minutes/week of aerobic exercise Low fat, low carb, high fiber diet recommended  Disposition:   FU in 6 months   Signed, Larae Grooms, MD  07/12/2021 12:28 PM    [facility] [address], [place], [place]  [address] Phone: [phone]; Fax: [phone]

## 2021-07-12 ENCOUNTER — Ambulatory Visit (INDEPENDENT_AMBULATORY_CARE_PROVIDER_SITE_OTHER): Payer: 59 | Admitting: Interventional Cardiology

## 2021-07-12 ENCOUNTER — Encounter: Payer: Self-pay | Admitting: Interventional Cardiology

## 2021-07-12 ENCOUNTER — Telehealth (HOSPITAL_COMMUNITY): Payer: Self-pay | Admitting: Pharmacist

## 2021-07-12 ENCOUNTER — Other Ambulatory Visit (HOSPITAL_COMMUNITY): Payer: Self-pay

## 2021-07-12 ENCOUNTER — Other Ambulatory Visit: Payer: Self-pay

## 2021-07-12 ENCOUNTER — Ambulatory Visit
Admission: RE | Admit: 2021-07-12 | Discharge: 2021-07-12 | Disposition: A | Discharge status: Home / Self Care | Payer: 59 | Source: Ambulatory Visit | Attending: Interventional Cardiology | Admitting: Interventional Cardiology

## 2021-07-12 VITALS — BP 145/92 | HR 71 | Ht 65.0 in | Wt 208.2 lb

## 2021-07-12 DIAGNOSIS — M79601 Pain in right arm: Secondary | ICD-10-CM

## 2021-07-12 DIAGNOSIS — I493 Ventricular premature depolarization: Secondary | ICD-10-CM

## 2021-07-12 DIAGNOSIS — E785 Hyperlipidemia, unspecified: Secondary | ICD-10-CM | POA: Diagnosis not present

## 2021-07-12 DIAGNOSIS — I2510 Atherosclerotic heart disease of native coronary artery without angina pectoris: Secondary | ICD-10-CM | POA: Diagnosis not present

## 2021-07-12 DIAGNOSIS — E119 Type 2 diabetes mellitus without complications: Secondary | ICD-10-CM

## 2021-07-12 DIAGNOSIS — I491 Atrial premature depolarization: Secondary | ICD-10-CM

## 2021-07-12 DIAGNOSIS — I10 Essential (primary) hypertension: Secondary | ICD-10-CM

## 2021-07-12 DIAGNOSIS — I480 Paroxysmal atrial fibrillation: Secondary | ICD-10-CM | POA: Diagnosis not present

## 2021-07-12 NOTE — Patient Instructions (Signed)
Medication Instructions:  Your physician recommends that you continue on your current medications as directed. Please refer to the Current Medication list given to you today.  *If you need a refill on your cardiac medications before your next appointment, please call your pharmacy*   Lab Work: None If you have labs (blood work) drawn today and your tests are completely normal, you will receive your results only by: [place] (if you have MyChart) OR A paper copy in the mail If you have any lab test that is abnormal or we need to change your treatment, we will call you to review the results.   Testing/Procedures: Your physician recommends that you have an xray done of your right arm.   Please go to [place] at [address] to have this done.  You do not need an appointment.   Follow-Up: At [facility], you and your health needs are our priority.  As part of our continuing mission to provide you with exceptional heart care, we have created designated Provider Care Teams.  These Care Teams include your primary Cardiologist (physician) and Advanced Practice Providers (APPs -  Physician Assistants and Nurse Practitioners) who all work together to provide you with the care you need, when you need it.  We recommend signing up for the patient portal called "MyChart".  Sign up information is provided on this After Visit Summary.  MyChart is used to connect with patients for Virtual Visits (Telemedicine).  Patients are able to view lab/test results, encounter notes, upcoming appointments, etc.  Non-urgent messages can be sent to your provider as well.   To learn more about what you can do with MyChart, go to [email or web address].    Your next appointment:   6 month(s)  The format for your next appointment:   In Person  Provider:   Larae Grooms, MD     Other Instructions

## 2021-07-12 NOTE — Telephone Encounter (Signed)
Pharmacy Transitions of Care Follow-up Telephone Call  Date of discharge: 06/27/21  Discharge Diagnosis: CAD with stent (already on plavix alone), a.fib (xarelto indication)  How have you been since you were released from the hospital?    Medication changes made at discharge: Continuing: Clopidogrel and Xarelto (Pt was on both meds prior to admission)   Medication changes verified by the patient?  Yes, she continues taking both meds and reports compliance    Medication Accessibility:  Home Pharmacy: [facility], [address]   Was the patient provided with refills on discharged medications? No  Have all prescriptions been transferred from [facility] to home pharmacy?    Is the patient able to afford medications? Has insurance  Medication Review:  RIVAROXABAN (XARELTO)  Rivaroxaban 20 mg daily  - Discussed importance of taking medication with food and around the same time everyday  - Reviewed potential DDIs with patient  - Advised patient of medications to avoid (NSAIDs, ASA)  - Educated that Tylenol (acetaminophen) will be the preferred analgesic to prevent risk of bleeding  - Emphasized importance of monitoring for signs and symptoms of bleeding (abnormal bruising, prolonged bleeding, nose bleeds, bleeding from gums, discolored urine, black tarry stools)  - Advised patient to alert all providers of anticoagulation therapy prior to starting a new medication or having a procedure    Follow-up Appointments:  [facility] f/u appt confirmed? Scheduled to see cardiology on 07/12/21  If their condition worsens, is the pt aware to call PCP or go to the Emergency Dept.? yes  Final Patient Assessment:  Pt was doing well after recent stent.  She was on clopidogrel and xarelto prior to admission and reports compliance.  She has not experienced adverse events or issues with these meds in the past and feels comfortable continuing on these.

## 2021-07-26 ENCOUNTER — Telehealth: Payer: Self-pay | Admitting: *Deleted

## 2021-07-26 ENCOUNTER — Telehealth: Payer: Self-pay

## 2021-07-26 DIAGNOSIS — Z79899 Other long term (current) drug therapy: Secondary | ICD-10-CM

## 2021-07-26 DIAGNOSIS — I480 Paroxysmal atrial fibrillation: Secondary | ICD-10-CM

## 2021-07-26 NOTE — Telephone Encounter (Signed)
-----   Message from Jettie Booze, MD sent at 07/26/2021 11:14 AM EST ----- I agree with Dayna's plan.  Can check BMet, magnesium level

## 2021-07-26 NOTE — Telephone Encounter (Signed)
Added BMET to the current lab orders for Mg on 07/28/21 per Varanasi's recommendation. Judson Roch, RN

## 2021-07-26 NOTE — Telephone Encounter (Signed)
-----   Message from Charlie Pitter, [place] sent at 07/26/2021  8:08 AM EST ----- Please inform patient that monitor overall was benign. Predominantly normal rhythm, Brief episodes of skips from top and bottom of heart, overall rare. There are also episodes of what we call "Wenckebach" heart block where the heart will periodically drop a beat. This is a benign finding and nothing specifically different to do - would not change management at this time. No atrial fibrillation or more sustained events. Would repeat Mg level now that she's on Mag Ox but otherwise will route to MD for additional review since Dr. Clayton Bibles saw her more recently than I did.

## 2021-07-28 ENCOUNTER — Other Ambulatory Visit: Payer: 59

## 2021-07-28 ENCOUNTER — Other Ambulatory Visit: Payer: 59 | Admitting: *Deleted

## 2021-07-28 ENCOUNTER — Other Ambulatory Visit: Payer: Self-pay

## 2021-07-28 ENCOUNTER — Other Ambulatory Visit: Payer: Self-pay | Admitting: Interventional Cardiology

## 2021-07-28 DIAGNOSIS — I480 Paroxysmal atrial fibrillation: Secondary | ICD-10-CM

## 2021-07-28 DIAGNOSIS — Z79899 Other long term (current) drug therapy: Secondary | ICD-10-CM

## 2021-07-29 LAB — MAGNESIUM: Magnesium: 1.7 mg/dL (ref 1.6–2.3)

## 2021-07-30 ENCOUNTER — Other Ambulatory Visit: Payer: Self-pay | Admitting: Cardiology

## 2021-08-01 ENCOUNTER — Telehealth: Payer: Self-pay | Admitting: *Deleted

## 2021-08-01 ENCOUNTER — Ambulatory Visit: Payer: 59 | Admitting: Interventional Cardiology

## 2021-08-01 DIAGNOSIS — Z79899 Other long term (current) drug therapy: Secondary | ICD-10-CM

## 2021-08-01 DIAGNOSIS — J9859 Other diseases of mediastinum, not elsewhere classified: Secondary | ICD-10-CM

## 2021-08-01 MED ORDER — MAGNESIUM OXIDE 400 MG PO TABS: 400.0000 mg | ORAL_TABLET | Freq: Three times a day (TID) | ORAL | 1 refills | Status: DC

## 2021-08-01 NOTE — Addendum Note (Signed)
Addended by: Gaetano Net on: 08/01/2021 08:08 AM   Modules accepted: Orders

## 2021-08-01 NOTE — Telephone Encounter (Signed)
-----   Message from Charlie Pitter, PA-C sent at 07/30/2021  7:14 AM EST ----- Please inform patient that magnesium level remains slightly below goal. Increase MagOx to TID with meals and recheck 2 weeks. Please increase dietary intake of healthy sources of magnesium including leafy greens, nuts, seeds, fish, beans, whole grains, avocados, yogurt, and bananas.

## 2021-08-07 ENCOUNTER — Encounter: Payer: Self-pay | Admitting: Interventional Cardiology

## 2021-08-07 ENCOUNTER — Telehealth: Payer: Self-pay | Admitting: Interventional Cardiology

## 2021-08-07 NOTE — Telephone Encounter (Signed)
° °  Pre-operative Risk Assessment    Patient Name: Courtney Olson  DOB: 1965/11/20 MRN: [id number]     Request for Surgical Clearance    Procedure:   Filling for  1 tooth - patient is calling Date of Surgery:  Clearance 08-07-21 at 11:oo this morning                               Surgeon:   Surgeon's Group or Practice Name:   Phone number:   Fax number:  -   Type of Clearance Requested:   Medicine- Does she need an Antibiotic before her dental work    Type of Anesthesia:   she does not know   Additional requests/questions:    Signed, Glyn Ade   08/07/2021, 9:11 AM

## 2021-08-07 NOTE — Telephone Encounter (Signed)
° °  Primary Cardiologist: Larae Grooms, MD  Chart reviewed as part of pre-operative protocol coverage. Simple dental extractions are considered low risk procedures per guidelines and generally do not require any specific cardiac clearance. It is also generally accepted that for simple extractions and dental cleanings, there is no need to interrupt blood thinner therapy.   SBE prophylaxis is not required for the patient.  I will route this recommendation to the requesting party via Epic fax function and remove from pre-op pool.  Please call with questions.  Deberah Pelton, NP 08/07/2021, 9:21 AM

## 2021-08-07 NOTE — Telephone Encounter (Signed)
OK to decrease imdur to 15 mg daily, wean off after 3-4 days.  OK to restart if she feels better on the Imdur.   No need for antibiotic at dentist.   JV

## 2021-08-07 NOTE — Telephone Encounter (Signed)
Left message for the pt to call back with information as to the name of the DDS office as well as a phone and possibly fax#, if not I will call the DDS office for fax # once I know who the DDS is.

## 2021-08-07 NOTE — Telephone Encounter (Signed)
I was able to s/w the pt this afternoon. I confirmed with the pt the name of the dental practice and dentist. I explained though she did not require SBE, she is on 2 blood thinners. I explained that before she has any procedure with any office, always check with the cardiologist first. I informed the pt that the dental office should had faxed over a clearance request to our office. Pt gave verbal understanding. Pt s/w Coletta Memos, FNP pre op provider today and aware she did not need SBE and was cleared by phone assessment today. I informed the pt that we want to still send this note over to the dental office for her chart with their practice as well. Pt thanked me for the help and gave me the dental information.   [place]; [place]: [phone] FAX: [phone]

## 2021-08-11 ENCOUNTER — Other Ambulatory Visit: Payer: 59

## 2021-08-11 ENCOUNTER — Other Ambulatory Visit: Payer: Self-pay

## 2021-08-11 DIAGNOSIS — Z79899 Other long term (current) drug therapy: Secondary | ICD-10-CM

## 2021-08-12 LAB — MAGNESIUM: Magnesium: 1.7 mg/dL (ref 1.6–2.3)

## 2021-08-14 ENCOUNTER — Telehealth: Payer: Self-pay | Admitting: *Deleted

## 2021-08-14 DIAGNOSIS — Z79899 Other long term (current) drug therapy: Secondary | ICD-10-CM

## 2021-08-14 NOTE — Telephone Encounter (Signed)
-----   Message from Charlie Pitter, [place] sent at 08/14/2021  7:58 AM EST ----- Please inform patient that Mg level is still suboptimal. First confirm she is taking as listed. If so, could be PPI causing low mag level. Would stop Protonix and try famotidine 20mg  daily. Recheck Mg level 2 weeks.

## 2021-08-14 NOTE — Telephone Encounter (Signed)
Call placed to pt regarding lab results, left a message for her to call back.

## 2021-08-16 NOTE — Telephone Encounter (Signed)
2nd attempt to reach pt. Left a message for her to call back.

## 2021-08-20 ENCOUNTER — Other Ambulatory Visit: Payer: Self-pay | Admitting: Interventional Cardiology

## 2021-08-22 NOTE — Progress Notes (Deleted)
BLOOD WORK

## 2021-08-24 ENCOUNTER — Other Ambulatory Visit: Payer: Self-pay | Admitting: Interventional Cardiology

## 2021-08-24 NOTE — Telephone Encounter (Signed)
Prescription refill request for Xarelto received.  Indication: Afib  Last office visit: 07/12/21 Irish Lack)  Weight: 94.4kg Age: 56 Scr: 0.97 (06/20/21)  CrCl: 97.24ml/min  Appropriate dose and refill sent to requested pharmacy.

## 2021-08-31 ENCOUNTER — Telehealth: Payer: Self-pay

## 2021-08-31 ENCOUNTER — Telehealth: Payer: Self-pay | Admitting: Interventional Cardiology

## 2021-08-31 ENCOUNTER — Other Ambulatory Visit: Payer: Self-pay | Admitting: *Deleted

## 2021-08-31 DIAGNOSIS — I480 Paroxysmal atrial fibrillation: Secondary | ICD-10-CM

## 2021-08-31 MED ORDER — RIVAROXABAN 20 MG PO TABS: 20.0000 mg | ORAL_TABLET | Freq: Every day | ORAL | 0 refills | Status: DC

## 2021-08-31 NOTE — Telephone Encounter (Signed)
Dr. Irish Lack Pt has CAD with RCA disease treated in 2020 and again 06/27/21. We have received a request to hold plavix and xarelto for a colonoscopy - procedure is not urgent.  Pt was told she needed to hold plavix for a colonoscopy next Thursday (they had a cancellation, performed for pancolitis). I relayed to her that we typically do not hold antiplatelets within 6 months of receiving a stent.   Can you please advise when she may hold plavix?  Of note - due to insurance, she would not have to pay out of pocket if performed prior to March 1.

## 2021-08-31 NOTE — Telephone Encounter (Signed)
Calling to let our office know that we would be getting a clearance for her for a colonoscopy done. And it needs to be filled back out and fax by tomorrow. Please advise

## 2021-08-31 NOTE — Telephone Encounter (Signed)
She had PCI in 06/2021 - she may not hold plavix.   I will send to PharmD for xarelto hold.

## 2021-08-31 NOTE — Telephone Encounter (Signed)
Xarelto 20mg  refill request received. Pt is 56 years old, weight-94.4kg, Crea-0.97 on 06/20/2021, last seen by Dr. Irish Lack on 07/12/2021, Diagnosis-Afib, CrCl-97.55ml/min; Dose is appropriate based on dosing criteria. Will send in refill to requested pharmacy.     Called pt since a Xarelto refill was sent on 08/24/21 for a year supply. Inquired if she needs a local supply until it arrives and she stated she did. She states her insurance only lets her get a 90 day supply. Advised will send in the refill to local pharmacy and to follow up to ensure she receives one to prevent any missed doses.

## 2021-08-31 NOTE — Telephone Encounter (Signed)
° °  Pre-operative Risk Assessment    Patient Name: Courtney Olson  DOB: 1966-02-04 MRN: [id number]{   Request for Surgical Clearance{  Procedure:   Colonoscopy    Date of Surgery:  Clearance 09/07/21                                Surgeon:  Dr Rolm Gala Group or Practice Name:  [facility] Phone number:  [phone] Fax number:  [phone]  Type of Clearance Requested:   - Medical  - Pharmacy:  Hold Clopidogrel (Plavix) and Rivaroxaban (Xarelto)    Type of Anesthesia:   Propofol  Additional requests/questions:   N/A  Job Founds T   08/31/2021, 4:30 PM

## 2021-09-01 NOTE — Telephone Encounter (Signed)
Patient with diagnosis of A fib on Xarelto for anticoagulation.  Patient had PCI in 12/22 and is on Plavix  Procedure: colonoscopy Date of procedure: 09/07/21   CHA2DS2-VASc Score = 5  This indicates a 7.2% annual risk of stroke. The patient's score is based upon: CHF History: 1 HTN History: 1 Diabetes History: 1 Stroke History: 0 Vascular Disease History: 1 Age Score: 0 Gender Score: 1    CrCl 78 ml/min using adjusted body weight Platelet count 178K  Per office protocol, patient can hold Xarelto for 2 days prior to procedure.

## 2021-09-04 ENCOUNTER — Other Ambulatory Visit: Payer: 59

## 2021-09-05 NOTE — Telephone Encounter (Signed)
As below, colonoscopy cancelled.  Dr. Irish Lack - no need to respond.  At last OV 06/2021 patient was instructed to f/u 6 months so will keep that plan in place.  Will remove request from preop pool.

## 2021-09-05 NOTE — Telephone Encounter (Signed)
Faxed back to [facility] to make them aware that we are waiting on response from MD.  Placed call [facility], sopke with Lenna Sciara, RN, to Dr. Michail Sermon, per [facility], she has cancelled her procedure.  This is was just for a repeat Colonoscopy, and pt cancelled due to not feeling safe to come off her medications.  Will route back to pre-op pool to make them aware procedure is cancelled.

## 2021-09-05 NOTE — Telephone Encounter (Signed)
° °  Patient Name: Courtney Olson  DOB: 13-Apr-1966 MRN: [id number]  Primary Cardiologist: Larae Grooms, MD  Chart reviewed as part of pre-operative protocol coverage. Patient had recent PCI 06/27/21 with recommendation to continue Plavix uninterrupted for at least 12 months.She is also on Xarelto for atrial fibrillation. We are awaiting input from Dr. Irish Lack about how early she could potentially hold her Plavix. I do not anticipate she will be cleared to stop it this close to recent intervention. I will route to callback pool to notify requesting provider that we are awaiting input from MD regarding holding Plavix, and to clarify urgency of procedure and whether procedure could be performed on Plavix.  Dr. Irish Lack - Please route response to [facility] PREOP (the pre-op pool). Thank you.    Charlie Pitter, PA-C 09/05/2021, 8:30 AM

## 2021-09-06 ENCOUNTER — Other Ambulatory Visit: Payer: 59

## 2021-09-06 ENCOUNTER — Telehealth: Payer: Self-pay | Admitting: Interventional Cardiology

## 2021-09-06 NOTE — Telephone Encounter (Addendum)
Noted. Callback (routed below), let GI team know that Dr. Irish Lack agrees that if colonoscopy is elective/screening, would try to wait 6 months after most recent stent before stopping Plavix - per recall, patient to follow up 12/2021 at which time this can be addressed. Will otherwise remove from preop APP pool.

## 2021-09-06 NOTE — Telephone Encounter (Signed)
Pt c/o medication issue:  1. Name of Medication: Pepcid AC  2. How are you currently taking this medication (dosage and times per day)? As needed  3. Are you having a reaction (difficulty breathing--STAT)?   4. What is your medication issue? Patient wanted to make Dr. Irish Lack aware that she is taking this medication as needed    Patient states Dr. Irish Lack had her stop taking the protonix a couple weeks ago. She has had a couple bouts of acid reflux since.

## 2021-09-07 ENCOUNTER — Ambulatory Visit: Payer: 59 | Admitting: Interventional Cardiology

## 2021-09-08 ENCOUNTER — Other Ambulatory Visit: Payer: Self-pay

## 2021-09-08 ENCOUNTER — Other Ambulatory Visit: Payer: 59 | Admitting: *Deleted

## 2021-09-08 DIAGNOSIS — Z79899 Other long term (current) drug therapy: Secondary | ICD-10-CM

## 2021-09-08 NOTE — Telephone Encounter (Signed)
Left the pt a message to call the office back to endorse recommendations per Dr. Varanasi.  

## 2021-09-08 NOTE — Telephone Encounter (Signed)
Patient was returning your call. Please advise  

## 2021-09-08 NOTE — Telephone Encounter (Signed)
Pt made aware that per Dr. Irish Lack, it is ok for her to take Pepcid.  Pt verbalized understanding and agrees with this plan. Pt was more than gracious for all the assistance provided.

## 2021-09-08 NOTE — Telephone Encounter (Signed)
OK to take Pepcid

## 2021-09-09 LAB — MAGNESIUM: Magnesium: 1.9 mg/dL (ref 1.6–2.3)

## 2022-01-31 ENCOUNTER — Other Ambulatory Visit: Payer: Self-pay | Admitting: Interventional Cardiology

## 2022-02-21 ENCOUNTER — Encounter (INDEPENDENT_AMBULATORY_CARE_PROVIDER_SITE_OTHER): Payer: Self-pay

## 2022-02-22 ENCOUNTER — Telehealth: Payer: Self-pay | Admitting: Interventional Cardiology

## 2022-02-22 NOTE — Telephone Encounter (Signed)
Pt is requesting call back in regards to abnormal EKG at PCP and symptoms like she had before her stints were put in. Pt states she is leaving on an international cruise 08/20 and will be gone quite a bit this month and September and her PCP requested she be seen by her cardiologist as soon as possible. She would like a call back for advice. Scheduled appt with NP, Swinyer on 08/15.

## 2022-02-22 NOTE — Telephone Encounter (Signed)
Pt call back note regarding abnormal EKG at PCP office seen / read / Pt called back.   Pt stated felt dizzy yesterday while shopping, and had mid-back pain today.  She stated again that she felt similar to when she had stents placed.  I looked at Pt PMH while on the telephone, and had a cardiac history.  Pt stated she saw Matthew Saras, ( Last name unknown ) and an EKG was ordered and completed at her PCP office.  Hanna PA-C did not like how the EKG looked, so patient says that she was handed the EKG, and told to take it to [facility] on [address].    ( Unknown to me, Pt had dropped off a copy of the EKG for Dr Irish Lack to see, and tried to get an appointment at [facility] on [address] tomorrow 02/23/22.  The schedule was full, and front staff stated that this occurred at end of clinic day with it being 520 pm, but scheduled patient appointment for her on 02/27/2022.  )  Pt admitted to me that she had felt some back pain today, but had intermittent dizziness yesterday.  With symptoms present, the patients PMH, and an abnormal EKG with it being the end of the day, I recommended the patient go to the nearest ER for evaluation and testing.   Pt stated she did not want to go to the ER for evaluation ( X4 ), and tried to convince me that the symptoms she mentioned were now not present? Enis Slipper, RN, Dr. Hassell Done nurse stated that he was on his way out the door and did get to see the EKG.  Dr. Irish Lack stated that "No significant changes" were seen on the EKG dropped off to the [facility] office.   I called the patient back on the telephone and told her what Dr. Irish Lack had stated above, but also stressed the facts, of her symptoms and past medical history, that if she is feeling bad or symptomatic she NEEDS to go to the nearest ER as soon as possible.  Pt stated she spoke to her daughter who is an Therapist, sports, and will speak to her husband to determine if she will go to the ER tonight. Pt understood why I had advised  an ER visit, my intentions, and thoroughly explained / educated her on current symptoms vs PMH warranting an ER visit today.  Pt was told if there is an opening on our schedule tomorrow, that we will reach out to her.  Pt is scheduled for [facility] office visit on 02/27/22.  Dr. Irish Lack is aware of everything stated in this note.... no follow up is required at this time.

## 2022-02-23 ENCOUNTER — Ambulatory Visit (INDEPENDENT_AMBULATORY_CARE_PROVIDER_SITE_OTHER): Payer: 59 | Admitting: Physician Assistant

## 2022-02-23 ENCOUNTER — Encounter: Payer: Self-pay | Admitting: Physician Assistant

## 2022-02-23 VITALS — BP 110/62 | HR 68 | Ht 65.0 in | Wt 209.0 lb

## 2022-02-23 DIAGNOSIS — E7800 Pure hypercholesterolemia, unspecified: Secondary | ICD-10-CM

## 2022-02-23 DIAGNOSIS — R072 Precordial pain: Secondary | ICD-10-CM | POA: Diagnosis not present

## 2022-02-23 DIAGNOSIS — I2510 Atherosclerotic heart disease of native coronary artery without angina pectoris: Secondary | ICD-10-CM | POA: Diagnosis not present

## 2022-02-23 DIAGNOSIS — I480 Paroxysmal atrial fibrillation: Secondary | ICD-10-CM

## 2022-02-23 DIAGNOSIS — I10 Essential (primary) hypertension: Secondary | ICD-10-CM | POA: Diagnosis not present

## 2022-02-23 DIAGNOSIS — E119 Type 2 diabetes mellitus without complications: Secondary | ICD-10-CM

## 2022-02-23 DIAGNOSIS — R0602 Shortness of breath: Secondary | ICD-10-CM | POA: Diagnosis not present

## 2022-02-23 DIAGNOSIS — I25709 Atherosclerosis of coronary artery bypass graft(s), unspecified, with unspecified angina pectoris: Secondary | ICD-10-CM

## 2022-02-23 MED ORDER — ISOSORBIDE MONONITRATE ER 30 MG PO TB24: 15.0000 mg | ORAL_TABLET | Freq: Every day | ORAL | 3 refills | Status: DC

## 2022-02-23 NOTE — Assessment & Plan Note (Signed)
The patient was to have follow-up blood work with her PCP today.  However, she came to our office for this appointment and would like to have her labs done here.  Obtain CMET, hemoglobin A1c.

## 2022-02-23 NOTE — H&P (View-Only) (Signed)
Cardiology Office Note:    Date:  02/23/2022   ID:  Courtney Olson, DOB [birth year], MRN [id number]  PCP:  Orpah Melter, MD  [place] Prov[facility] Cardiologist:  Larae Grooms, MD    Referring MD: Orpah Melter, MD   Chief Complaint:  Chest Pain    Patient Profile: Coronary artery disease  S/p DES to RCA in 05/2019 S/p DES to RCA in 06/2021 due to ISR  Paroxysmal atrial fibrillation  Rx w Rivaroxaban  Monitor 07/2021: no AFib Aortic atherosclerosis  Hypertension  Diabetes mellitus, insulin dependent  Hyperlipidemia  OSA Bradycardia Hx of vasovagal syncope  Hx of pancreatitis  S/p Renal mass excision (benign) Ulcerative colitis  Hx of CMV hepatitis  Rheumatoid arthritis   Prior CV Studies: LONG TERM MONITOR (8-14 DAYS) INTERPRETATION 07/24/2021 Normal sinus rhythm with rare PACs and PVCs. // Rare, brief runs of PACs. // Patient symptoms correlated to PVCs. // No atrial fibrillation.  LEFT HEART CATH AND CORONARY ANGIOGRAPHY, LEFT HEART CATH AND CORONARY ANGIOGRAPHY 06/27/2021 RCA proximal 25, mid stent distal edge 90 ISR (otherwise patent), distal 25  LCx ostial 25  D1 60, D2 60  EF 55-60 PCI: 2.5 x 15 mm Onyx frontier DES to the mid RCA     ECHO COMPLETE WO IMAGING ENHANCING AGENT 07/07/2021 EF 60-65, no RWMA, normal RVSF, mild AV calcification, no aortic stenosis   GATED SPECT MYO PERF W/LEXISCAN STRESS 1D 04/07/2020 Normal stress nuclear study with no ischemia or infarction; gated EF 75; normal wall motion.  CT CORONARY MORPH W/CTA COR W/SCORE W/CA W/CM &/OR WO/CM 05/13/2019 IMPRESSION: 1. Coronary calcium score of 2924. This was 99th percentile for age and sex matched control. 2. Normal coronary origin with right dominance. 3. Concerns for a chronic total occlusion (100%) of the mRCA with L to R collaterals as described above. 4. Concerns for moderate proximal and mid LAD (50-69%) stenoses with small vessel disease in the diagonal  branches. 5. Mild calcified plaque (25-49%) in the LCX/OM branches. FFR: 1. Left Main: 0.98; No significant stenosis. 2. LAD: 0.87; no significant stenosis. 3. LCX: 0.86; no significant stenosis. 4. RCA: 0.70; significant stenosis.   History of Present Illness:   Courtney Olson is a 56 y.o. female with the above problem list.  She was last seen by Dr. Irish Lack in Dec 2022 after her most recent PCI.  She returns for evaluation of chest/back pain and shortness of breath.  Prior to both PCI's, she had thoracic back pain that was brought on by certain positional changes as well as significant shortness of breath with exertion.  She has recently gained about 10 pounds.  She is getting ready to go on a cruise in a little over 1 week and will be gone for about 1 month.  She has recently increased activity to try to lose some weight prior to her trip.  With the sudden increase in activity, she has noted more shortness of breath with activities.  She also describes a heaviness/tightness in her chest towards the end of activity.  She has continued to have the same back pain since both of her PCI's.  It does not sound as though this symptom has ever completely resolved.  She went for a routine visit with her PCP yesterday.  She mentioned the symptoms during her visit and had an electrocardiogram.  It was felt that her electrocardiogram was somewhat abnormal and she was asked to follow-up here.  She brought the EKG by yesterday which was  reviewed by Dr. Irish Lack.  He did not feel that there were any significant changes.  She has not had orthopnea, leg edema or syncope.  She has had issues with vasovagal syncope in the past.  She had a recurrent episode of diarrhea while shopping recently and had lightheadedness, near syncope after this episode.    Past Medical History:  Diagnosis Date   Aortic calcification (HCC)    Atrial tachycardia (HCC)    Atrophic vaginitis 01/2006   Bradycardia 05/2014   CAD (coronary  artery disease)    cardiac cath 05/21/2019 showing 805 mRCA, 60% D1 and D2 that were very small and patent LAD and LCx.  She underwent DES to the [facility] with residual 25% stenosis in the dRCA   Chronic pancolonic ulcerative colitis ([place]) 2015   unknown if chronic.   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   Diabetes mellitus without complication ([place]) 39/7673   on Ace inhibitor to protect kidneys   Gallstones 01/2013   GERD (gastroesophageal reflux disease)    Hyperlipidemia    Hypertension    Kidney tumor 03/2013   Right   NSVT (nonsustained ventricular tachycardia) (HCC)    Obesity    OSA (obstructive sleep apnea)    PAF (paroxysmal atrial fibrillation) ([place])    Pancreatitis 2014   Postmenopausal    Premature atrial contractions    PVC's (premature ventricular contractions)    RA (rheumatoid arthritis) (HCC)    Syncope and collapse    Ulcerative colitis ([place]) 2015   Current Medications: Current Meds  Medication Sig   ALPRAZolam (XANAX) 0.25 MG tablet Take 1 tablet (0.25 mg total) by mouth 2 (two) times daily as needed.   amoxicillin (AMOXIL) 875 MG tablet Take 875 mg by mouth 2 (two) times daily.   atorvastatin (LIPITOR) 40 MG tablet TAKE 1 TABLET BY MOUTH  DAILY   Cholecalciferol (VITAMIN D3 PO) Take 1 capsule by mouth daily.   clopidogrel (PLAVIX) 75 MG tablet Take 1 tablet (75 mg total) by mouth daily with breakfast.   EPINEPHrine 0.3 mg/0.3 mL IJ SOAJ injection Inject 0.3 mLs into the skin as needed.   ezetimibe (ZETIA) 10 MG tablet TAKE 1 TABLET(10 MG) BY MOUTH DAILY   furosemide (LASIX) 20 MG tablet Take 20 mg by mouth as needed for fluid or edema.   insulin glargine, 2 Unit Dial, (TOUJEO MAX SOLOSTAR) 300 UNIT/ML Solostar Pen Inject 14 Units into the skin 2 (two) times daily.   insulin lispro (HUMALOG) 100 UNIT/ML injection Inject 18 Units into the skin 2 (two) times daily.   isosorbide mononitrate (IMDUR) 30 MG 24 hr tablet Take 0.5  tablets (15 mg total) by mouth daily.   magnesium oxide (MAG-OX) 400 MG tablet Take 1 tablet (400 mg total) by mouth in the morning, at noon, and at bedtime.   mesalamine (LIALDA) 1.2 g EC tablet Take 1.2 g by mouth daily.    metoprolol succinate (TOPROL-XL) 25 MG 24 hr tablet TAKE 1 TABLET BY MOUTH  DAILY   Multiple Vitamin (MULTIVITAMIN) tablet Take 1 tablet by mouth daily.   nitroGLYCERIN (NITROSTAT) 0.4 MG SL tablet Place 1 tablet (0.4 mg total) under the tongue every 5 (five) minutes as needed.   ramipril (ALTACE) 2.5 MG capsule Take 2.5 mg by mouth daily.   rivaroxaban (XARELTO) 20 MG TABS tablet Take 1 tablet (20 mg total) by mouth daily.   triamcinolone (KENALOG) 0.1 % Apply 1 application topically daily as  needed (Rash).   [DISCONTINUED] ramipril (ALTACE) 2.5 MG capsule Take 1 capsule (2.5 mg total) by mouth daily.    Allergies:   Orange fruit [citrus], Other, Empagliflozin, Metformin hcl, Nitrofurantoin, Bactrim [sulfamethoxazole-trimethoprim], and Tape   Social History   Tobacco Use   Smoking status: Never   Smokeless tobacco: Never  Vaping Use   Vaping Use: Never used  Substance Use Topics   Alcohol use: Yes    Comment: 2 a month   Drug use: No    Family Hx: The patient's family history includes Breast cancer (age of onset: 30) in her sister; Cancer in her maternal grandmother and paternal aunt; Diabetes in her father, maternal grandmother, mother, and paternal aunt; Fibromyalgia in her mother; Heart disease in her father; Hypertension in her father, mother, and sister.  Review of Systems  Gastrointestinal:  Negative for hematochezia.  Genitourinary:  Negative for hematuria.     EKGs/Labs/Other Test Reviewed:    EKG:  EKG is not ordered today.  The ekg ordered today demonstrates n/a  EKG from PCP obtained 02/22/2022 personally viewed and interpreted: NSR, HR 62, normal axis, nonspecific ST-T wave changes, RSR prime V1, QTc 425, no change when compared to prior  tracings  Recent Labs: 06/20/2021: BUN 17; Creatinine, Ser 0.97; Hemoglobin 13.3; Platelets 178; Potassium 4.5; Sodium 139; TSH 2.460 09/08/2021: Magnesium 1.9   Recent Lipid Panel No results for input(s): "CHOL", "TRIG", "HDL", "VLDL", "LDLCALC", "LDLDIRECT" in the last 8760 hours.   Risk Assessment/Calculations/Metrics:    CHA2DS2-VASc Score = 4   This indicates a 4.8% annual risk of stroke. The patient's score is based upon: CHF History: 0 HTN History: 1 Diabetes History: 1 Stroke History: 0 Vascular Disease History: 1 Age Score: 0 Gender Score: 1             Physical Exam:    VS:  BP 110/62   Pulse 68   Ht '5\' 5"'$  (1.651 m)   Wt 209 lb (94.8 kg)   LMP 07/16/2000 (Approximate)   SpO2 96%   BMI 34.78 kg/m     Wt Readings from Last 3 Encounters:  02/23/22 209 lb (94.8 kg)  07/12/21 208 lb 3.2 oz (94.4 kg)  06/27/21 205 lb (93 kg)    Constitutional:      Appearance: Healthy appearance. Not in distress.  Neck:     Vascular: No JVR. JVD normal.  Pulmonary:     Effort: Pulmonary effort is normal.     Breath sounds: No wheezing. No rales.  Cardiovascular:     Normal rate. Regular rhythm. Normal S1. Normal S2.      Murmurs: There is no murmur.  Edema:    Peripheral edema absent.  Abdominal:     Palpations: Abdomen is soft.  Skin:    General: Skin is warm and dry.  Neurological:     Mental Status: Alert and oriented to person, place and time.         ASSESSMENT & PLAN:   Precordial chest pain She notes some chest discomfort that feels like a heaviness or tightness at the end of exercise recently.  She is also noted some increased shortness of breath with certain activities.  She has recently significantly escalated her exercise routine to help lose weight prior to her trip.  She has also gained about 10 pounds.  Her electrocardiogram does not demonstrate any significant change.  She does not feel exactly the way she felt prior to both PCI's.  However, she is  quite nervous about going on her trip and not knowing if anything is wrong.  We discussed the rationale for stress testing as well as cardiac catheterization.  She is not a candidate for coronary CTA given prior PCI.  Given her current symptoms, I think it is reasonable to proceed with stress testing.  I have also asked her to resume isosorbide mononitrate at 15 mg daily.  If this is low risk, she will likely not need further testing.  Given shortness of breath, obtain BNP.  Coronary artery disease involving coronary bypass graft of native heart with angina pectoris [facility]) History of prior PCI with DES to the RCA in 2020 and repeat PCI with DES to the RCA secondary to in-stent restenosis in December 2022.  As noted, she has had some recurrent chest discomfort as well as back pain and shortness of breath.  Her symptoms are not exactly like her previous angina.  However, she is significantly concerned.  As noted, her electrocardiogram is reassuring.  Proceed with stress testing as outlined.  Continue Toprol-XL 25 mg daily, Lipitor 40 mg daily, Plavix 85 mg daily.  Resume isosorbide mononitrate 15 mg daily.  Follow-up in 3 months.  PAF (paroxysmal atrial fibrillation) (HCC) Maintaining sinus rhythm.  She is tolerating anticoagulation.  Obtain follow-up CMET, CBC.  Continue Xarelto 20 mg daily.  Hyperlipidemia Continue Lipitor 40 mg daily, Zetia 10 mg daily.  Obtain follow-up CMET, lipids.  Hypertension Blood pressure is well-controlled.  Obtain follow-up CMET, magnesium.  Continue Toprol-XL 25 mg daily, Altace 2.5 mg daily.  Diabetes mellitus without complication ([place]) The patient was to have follow-up blood work with her PCP today.  However, she came to our office for this appointment and would like to have her labs done here.  Obtain CMET, hemoglobin A1c.        Shared Decision Making/Informed Consent The risks [chest pain, shortness of breath, cardiac arrhythmias, dizziness, blood pressure  fluctuations, myocardial infarction, stroke/transient ischemic attack, nausea, vomiting, allergic reaction, radiation exposure, metallic taste sensation and life-threatening complications (estimated to be 1 in 10,000)], benefits (risk stratification, diagnosing coronary artery disease, treatment guidance) and alternatives of a nuclear stress test were discussed in detail with Ms. Short and she agrees to proceed.   Dispo:  Return in about 3 months (around 05/26/2022) for Routine Follow Up with Dr. Irish Lack.   Medication Adjustments/Labs and Tests Ordered: Current medicines are reviewed at length with the patient today.  Concerns regarding medicines are outlined above.  Tests Ordered: Orders Placed This Encounter  Procedures   Pro b natriuretic peptide (BNP)   Comprehensive metabolic panel   Magnesium   Lipid panel   CBC   HgB A1c   MYOCARDIAL PERFUSION IMAGING   Medication Changes: Meds ordered this encounter  Medications   isosorbide mononitrate (IMDUR) 30 MG 24 hr tablet    Sig: Take 0.5 tablets (15 mg total) by mouth daily.    Dispense:  45 tablet    Refill:  3   Signed, Richardson Dopp, PA-C  02/23/2022 1:23 PM    [address], [place],   [address] Phone: [phone]; Fax: [phone]   ADDENDUM 02/27/22 2:41 PM  GATED SPECT MYO PERF W/LEXISCAN STRESS 1D 02/27/2022  Narrative   Findings are consistent with ischemia. The study is intermediate risk.   LV perfusion is abnormal. There is evidence of ischemia. Defect 1: There is a small defect with mild reduction in uptake present in the apical to mid  inferior location(s) that is reversible. There is normal wall motion in the defect area. Consistent with ischemia. Defect 2: There is a small defect with mild reduction in uptake present in the mid to basal inferoseptal location(s) that is reversible.   Left ventricular function is normal. Nuclear stress EF: 69 %. The left ventricular ejection fraction is  hyperdynamic (>65%). End diastolic cavity size is normal.   Prior study available for comparison from 04/07/2020. Compared to prior study, there is now mid-to-apical inferior and basal-to-mid inferoseptal ischemia.    Reviewed findings with Dr. Irish Lack. Defect is small. It is reasonable to continue med Rx if asymptomatic.  I called the patient. She did have to take NTG the other night for back pain which is her anginal equivalent. She leaves this weekend for a cruise and would like to know that she is ok. She will be on a cruise ship for a week. After that, she has a trip scheduled to go to [place]. She has reduced her activity significantly since her visit last week. She notes significant shortness of breath while on the treadmill today.  PLAN:  Arrange cardiac catheterization with Dr. Irish Lack on Friday 8/18. Labs done on 8/11 (Creatinine, Hgb normal). Continue Toprol XL, Imdur, Plavix, Lipitor Hold Xarelto x 2 days prior to cath  Shared Decision Making/Informed Consent The risks [stroke (1 in 1000), death (1 in 1000), kidney failure [usually temporary] (1 in 500), bleeding (1 in 200), allergic reaction [possibly serious] (1 in 200)], benefits (diagnostic support and management of coronary artery disease) and alternatives of a cardiac catheterization were discussed in detail with Ms. Klump and she is willing to proceed.  Richardson Dopp, PA-C    02/27/2022 2:45 PM

## 2022-02-23 NOTE — Assessment & Plan Note (Addendum)
She notes some chest discomfort that feels like a heaviness or tightness at the end of exercise recently.  She is also noted some increased shortness of breath with certain activities.  She has recently significantly escalated her exercise routine to help lose weight prior to her trip.  She has also gained about 10 pounds.  Her electrocardiogram does not demonstrate any significant change.  She does not feel exactly the way she felt prior to both PCI's.  However, she is quite nervous about going on her trip and not knowing if anything is wrong.  We discussed the rationale for stress testing as well as cardiac catheterization.  She is not a candidate for coronary CTA given prior PCI.  Given her current symptoms, I think it is reasonable to proceed with stress testing.  I have also asked her to resume isosorbide mononitrate at 15 mg daily.  If this is low risk, she will likely not need further testing.  Given shortness of breath, obtain BNP.

## 2022-02-23 NOTE — Telephone Encounter (Signed)
I spoke with patient and scheduled her to see Richardson Dopp, PA today at 10:55

## 2022-02-23 NOTE — Assessment & Plan Note (Signed)
Continue Lipitor 40 mg daily, Zetia 10 mg daily.  Obtain follow-up CMET, lipids.

## 2022-02-23 NOTE — Patient Instructions (Addendum)
Medication Instructions:  Your physician has recommended you make the following change in your medication:  RESTART IMDUR: TAKE 15 MG DAILY  *If you need a refill on your cardiac medications before your next appointment, please call your pharmacy*   Lab Work: TODAY: CMET, MAGNESIUM, LIPIDS, BNP, CBC  If you have labs (blood work) drawn today and your tests are completely normal, you will receive your results only by: [place] (if you have MyChart) OR A paper copy in the mail If you have any lab test that is abnormal or we need to change your treatment, we will call you to review the results.   Testing/Procedures: Your physician has requested that you have en exercise stress myoview. For further information please visit [email or web address]. Please follow instruction sheet, as given. TO BE DONE 02/26/22 OR 02/27/22  Follow-Up: At [facility], you and your health needs are our priority.  As part of our continuing mission to provide you with exceptional heart care, we have created designated Provider Care Teams.  These Care Teams include your primary Cardiologist (physician) and Advanced Practice Providers (APPs -  Physician Assistants and Nurse Practitioners) who all work together to provide you with the care you need, when you need it.  We recommend signing up for the patient portal called "MyChart".  Sign up information is provided on this After Visit Summary.  MyChart is used to connect with patients for Virtual Visits (Telemedicine).  Patients are able to view lab/test results, encounter notes, upcoming appointments, etc.  Non-urgent messages can be sent to your provider as well.   To learn more about what you can do with MyChart, go to [email or web address].    Your next appointment:   3 month(s)  The format for your next appointment:   In Person  Provider:   Larae Grooms, MD {   Important Information About Sugar

## 2022-02-23 NOTE — Assessment & Plan Note (Signed)
>>  ASSESSMENT AND PLAN FOR CORONARY ARTERY DISEASE INVOLVING CORONARY BYPASS GRAFT OF NATIVE HEART WITH ANGINA PECTORIS (HCC) WRITTEN ON 02/23/2022  1:19 PM BY Amayah Staheli T, PA-C  History of prior PCI with DES to the RCA in 2020 and repeat PCI with DES to the RCA secondary to in-stent restenosis in December 2022.  As noted, she has had some recurrent chest discomfort as well as back pain and shortness of breath.  Her symptoms are not exactly like her previous angina.  However, she is significantly concerned.  As noted, her electrocardiogram is reassuring.  Proceed with stress testing as outlined.  Continue Toprol -XL 25 mg daily, Lipitor 40 mg daily, Plavix  85 mg daily.  Resume isosorbide  mononitrate 15 mg daily.  Follow-up in 3 months.

## 2022-02-23 NOTE — Progress Notes (Signed)
Cardiology Office Note:    Date:  02/23/2022   ID:  [name], DOB Jul 01, 1966, MRN [id number]  PCP:  Orpah Melter, MD  [facility] Cardiologist:  Larae Grooms, MD    Referring MD: Orpah Melter, MD   Chief Complaint:  Chest Pain    Patient Profile: Coronary artery disease  S/p DES to RCA in 05/2019 S/p DES to RCA in 06/2021 due to ISR  Paroxysmal atrial fibrillation  Rx w Rivaroxaban  Monitor 07/2021: no AFib Aortic atherosclerosis  Hypertension  Diabetes mellitus, insulin dependent  Hyperlipidemia  OSA Bradycardia Hx of vasovagal syncope  Hx of pancreatitis  S/p Renal mass excision (benign) Ulcerative colitis  Hx of CMV hepatitis  Rheumatoid arthritis   Prior CV Studies: LONG TERM MONITOR (8-14 DAYS) INTERPRETATION 07/24/2021 Normal sinus rhythm with rare PACs and PVCs. // Rare, brief runs of PACs. // Patient symptoms correlated to PVCs. // No atrial fibrillation.  LEFT HEART CATH AND CORONARY ANGIOGRAPHY, LEFT HEART CATH AND CORONARY ANGIOGRAPHY 06/27/2021 RCA proximal 25, mid stent distal edge 90 ISR (otherwise patent), distal 25  LCx ostial 25  D1 60, D2 60  EF 55-60 PCI: 2.5 x 15 mm Onyx frontier DES to the mid RCA     ECHO COMPLETE WO IMAGING ENHANCING AGENT 07/07/2021 EF 60-65, no RWMA, normal RVSF, mild AV calcification, no aortic stenosis   GATED SPECT MYO PERF W/LEXISCAN STRESS 1D 04/07/2020 Normal stress nuclear study with no ischemia or infarction; gated EF 75; normal wall motion.  CT CORONARY MORPH W/CTA COR W/SCORE W/CA W/CM &/OR WO/CM 05/13/2019 IMPRESSION: 1. Coronary calcium score of 2924. This was 99th percentile for age and sex matched control. 2. Normal coronary origin with right dominance. 3. Concerns for a chronic total occlusion (100%) of the mRCA with L to R collaterals as described above. 4. Concerns for moderate proximal and mid LAD (50-69%) stenoses with small vessel disease in the diagonal  branches. 5. Mild calcified plaque (25-49%) in the LCX/OM branches. FFR: 1. Left Main: 0.98; No significant stenosis. 2. LAD: 0.87; no significant stenosis. 3. LCX: 0.86; no significant stenosis. 4. RCA: 0.70; significant stenosis.   History of Present Illness:   Courtney Olson is a 56 y.o. female with the above problem list.  She was last seen by Dr. Irish Lack in Dec 2022 after her most recent PCI.  She returns for evaluation of chest/back pain and shortness of breath.  Prior to both PCI's, she had thoracic back pain that was brought on by certain positional changes as well as significant shortness of breath with exertion.  She has recently gained about 10 pounds.  She is getting ready to go on a cruise in a little over 1 week and will be gone for about 1 month.  She has recently increased activity to try to lose some weight prior to her trip.  With the sudden increase in activity, she has noted more shortness of breath with activities.  She also describes a heaviness/tightness in her chest towards the end of activity.  She has continued to have the same back pain since both of her PCI's.  It does not sound as though this symptom has ever completely resolved.  She went for a routine visit with her PCP yesterday.  She mentioned the symptoms during her visit and had an electrocardiogram.  It was felt that her electrocardiogram was somewhat abnormal and she was asked to follow-up here.  She brought the EKG by yesterday which was  reviewed by Dr. Irish Lack.  He did not feel that there were any significant changes.  She has not had orthopnea, leg edema or syncope.  She has had issues with vasovagal syncope in the past.  She had a recurrent episode of diarrhea while shopping recently and had lightheadedness, near syncope after this episode.    Past Medical History:  Diagnosis Date   Aortic calcification (HCC)    Atrial tachycardia (HCC)    Atrophic vaginitis 01/2006   Bradycardia 05/2014   CAD (coronary  artery disease)    cardiac cath 05/21/2019 showing 805 mRCA, 60% D1 and D2 that were very small and patent LAD and LCx.  She underwent DES to the [facility] with residual 25% stenosis in the dRCA   Chronic pancolonic ulcerative colitis ([place]) 2015   unknown if chronic.   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) 69/4854   on Ace inhibitor to protect kidneys   Gallstones 01/2013   GERD (gastroesophageal reflux disease)    Hyperlipidemia    Hypertension    Kidney tumor 03/2013   Right   NSVT (nonsustained ventricular tachycardia) (HCC)    Obesity    OSA (obstructive sleep apnea)    PAF (paroxysmal atrial fibrillation) ([place])    Pancreatitis 2014   Postmenopausal    Premature atrial contractions    PVC's (premature ventricular contractions)    RA (rheumatoid arthritis) (HCC)    Syncope and collapse    Ulcerative colitis ([place]) 2015   Current Medications: Current Meds  Medication Sig   ALPRAZolam (XANAX) 0.25 MG tablet Take 1 tablet (0.25 mg total) by mouth 2 (two) times daily as needed.   amoxicillin (AMOXIL) 875 MG tablet Take 875 mg by mouth 2 (two) times daily.   atorvastatin (LIPITOR) 40 MG tablet TAKE 1 TABLET BY MOUTH  DAILY   Cholecalciferol (VITAMIN D3 PO) Take 1 capsule by mouth daily.   clopidogrel (PLAVIX) 75 MG tablet Take 1 tablet (75 mg total) by mouth daily with breakfast.   EPINEPHrine 0.3 mg/0.3 mL IJ SOAJ injection Inject 0.3 mLs into the skin as needed.   ezetimibe (ZETIA) 10 MG tablet TAKE 1 TABLET(10 MG) BY MOUTH DAILY   furosemide (LASIX) 20 MG tablet Take 20 mg by mouth as needed for fluid or edema.   insulin glargine, 2 Unit Dial, (TOUJEO MAX SOLOSTAR) 300 UNIT/ML Solostar Pen Inject 14 Units into the skin 2 (two) times daily.   insulin lispro (HUMALOG) 100 UNIT/ML injection Inject 18 Units into the skin 2 (two) times daily.   isosorbide mononitrate (IMDUR) 30 MG 24 hr tablet Take 0.5  tablets (15 mg total) by mouth daily.   magnesium oxide (MAG-OX) 400 MG tablet Take 1 tablet (400 mg total) by mouth in the morning, at noon, and at bedtime.   mesalamine (LIALDA) 1.2 g EC tablet Take 1.2 g by mouth daily.    metoprolol succinate (TOPROL-XL) 25 MG 24 hr tablet TAKE 1 TABLET BY MOUTH  DAILY   Multiple Vitamin (MULTIVITAMIN) tablet Take 1 tablet by mouth daily.   nitroGLYCERIN (NITROSTAT) 0.4 MG SL tablet Place 1 tablet (0.4 mg total) under the tongue every 5 (five) minutes as needed.   ramipril (ALTACE) 2.5 MG capsule Take 2.5 mg by mouth daily.   rivaroxaban (XARELTO) 20 MG TABS tablet Take 1 tablet (20 mg total) by mouth daily.   triamcinolone (KENALOG) 0.1 % Apply 1 application topically daily as  needed (Rash).   [DISCONTINUED] ramipril (ALTACE) 2.5 MG capsule Take 1 capsule (2.5 mg total) by mouth daily.    Allergies:   Orange fruit [citrus], Other, Empagliflozin, Metformin hcl, Nitrofurantoin, Bactrim [sulfamethoxazole-trimethoprim], and Tape   Social History   Tobacco Use   Smoking status: Never   Smokeless tobacco: Never  Vaping Use   Vaping Use: Never used  Substance Use Topics   Alcohol use: Yes    Comment: 2 a month   Drug use: No    Family Hx: The patient's family history includes Breast cancer (age of onset: 59) in her sister; Cancer in her maternal grandmother and paternal aunt; Diabetes in her father, maternal grandmother, mother, and paternal aunt; Fibromyalgia in her mother; Heart disease in her father; Hypertension in her father, mother, and sister.  Review of Systems  Gastrointestinal:  Negative for hematochezia.  Genitourinary:  Negative for hematuria.     EKGs/Labs/Other Test Reviewed:    EKG:  EKG is not ordered today.  The ekg ordered today demonstrates n/a  EKG from PCP obtained 02/22/2022 personally viewed and interpreted: NSR, HR 62, normal axis, nonspecific ST-T wave changes, RSR prime V1, QTc 425, no change when compared to prior  tracings  Recent Labs: 06/20/2021: BUN 17; Creatinine, Ser 0.97; Hemoglobin 13.3; Platelets 178; Potassium 4.5; Sodium 139; TSH 2.460 09/08/2021: Magnesium 1.9   Recent Lipid Panel No results for input(s): "CHOL", "TRIG", "HDL", "VLDL", "LDLCALC", "LDLDIRECT" in the last 8760 hours.   Risk Assessment/Calculations/Metrics:    CHA2DS2-VASc Score = 4   This indicates a 4.8% annual risk of stroke. The patient's score is based upon: CHF History: 0 HTN History: 1 Diabetes History: 1 Stroke History: 0 Vascular Disease History: 1 Age Score: 0 Gender Score: 1             Physical Exam:    VS:  BP 110/62   Pulse 68   Ht '5\' 5"'$  (1.651 m)   Wt 209 lb (94.8 kg)   LMP 07/16/2000 (Approximate)   SpO2 96%   BMI 34.78 kg/m     Wt Readings from Last 3 Encounters:  02/23/22 209 lb (94.8 kg)  07/12/21 208 lb 3.2 oz (94.4 kg)  06/27/21 205 lb (93 kg)    Constitutional:      Appearance: Healthy appearance. Not in distress.  Neck:     Vascular: No JVR. JVD normal.  Pulmonary:     Effort: Pulmonary effort is normal.     Breath sounds: No wheezing. No rales.  Cardiovascular:     Normal rate. Regular rhythm. Normal S1. Normal S2.      Murmurs: There is no murmur.  Edema:    Peripheral edema absent.  Abdominal:     Palpations: Abdomen is soft.  Skin:    General: Skin is warm and dry.  Neurological:     Mental Status: Alert and oriented to person, place and time.         ASSESSMENT & PLAN:   Precordial chest pain She notes some chest discomfort that feels like a heaviness or tightness at the end of exercise recently.  She is also noted some increased shortness of breath with certain activities.  She has recently significantly escalated her exercise routine to help lose weight prior to her trip.  She has also gained about 10 pounds.  Her electrocardiogram does not demonstrate any significant change.  She does not feel exactly the way she felt prior to both PCI's.  However, she is  quite nervous about going on her trip and not knowing if anything is wrong.  We discussed the rationale for stress testing as well as cardiac catheterization.  She is not a candidate for coronary CTA given prior PCI.  Given her current symptoms, I think it is reasonable to proceed with stress testing.  I have also asked her to resume isosorbide mononitrate at 15 mg daily.  If this is low risk, she will likely not need further testing.  Given shortness of breath, obtain BNP.  Coronary artery disease involving coronary bypass graft of native heart with angina pectoris [facility]) History of prior PCI with DES to the RCA in 2020 and repeat PCI with DES to the RCA secondary to in-stent restenosis in December 2022.  As noted, she has had some recurrent chest discomfort as well as back pain and shortness of breath.  Her symptoms are not exactly like her previous angina.  However, she is significantly concerned.  As noted, her electrocardiogram is reassuring.  Proceed with stress testing as outlined.  Continue Toprol-XL 25 mg daily, Lipitor 40 mg daily, Plavix 85 mg daily.  Resume isosorbide mononitrate 15 mg daily.  Follow-up in 3 months.  PAF (paroxysmal atrial fibrillation) (HCC) Maintaining sinus rhythm.  She is tolerating anticoagulation.  Obtain follow-up CMET, CBC.  Continue Xarelto 20 mg daily.  Hyperlipidemia Continue Lipitor 40 mg daily, Zetia 10 mg daily.  Obtain follow-up CMET, lipids.  Hypertension Blood pressure is well-controlled.  Obtain follow-up CMET, magnesium.  Continue Toprol-XL 25 mg daily, Altace 2.5 mg daily.  Diabetes mellitus without complication ([place]) The patient was to have follow-up blood work with her PCP today.  However, she came to our office for this appointment and would like to have her labs done here.  Obtain CMET, hemoglobin A1c.        Shared Decision Making/Informed Consent The risks [chest pain, shortness of breath, cardiac arrhythmias, dizziness, blood pressure  fluctuations, myocardial infarction, stroke/transient ischemic attack, nausea, vomiting, allergic reaction, radiation exposure, metallic taste sensation and life-threatening complications (estimated to be 1 in 10,000)], benefits (risk stratification, diagnosing coronary artery disease, treatment guidance) and alternatives of a nuclear stress test were discussed in detail with Ms. Colantonio and she agrees to proceed.   Dispo:  Return in about 3 months (around 05/26/2022) for Routine Follow Up with Dr. Irish Lack.   Medication Adjustments/Labs and Tests Ordered: Current medicines are reviewed at length with the patient today.  Concerns regarding medicines are outlined above.  Tests Ordered: Orders Placed This Encounter  Procedures   Pro b natriuretic peptide (BNP)   Comprehensive metabolic panel   Magnesium   Lipid panel   CBC   HgB A1c   MYOCARDIAL PERFUSION IMAGING   Medication Changes: Meds ordered this encounter  Medications   isosorbide mononitrate (IMDUR) 30 MG 24 hr tablet    Sig: Take 0.5 tablets (15 mg total) by mouth daily.    Dispense:  45 tablet    Refill:  3   Signed, Richardson Dopp, PA-C  02/23/2022 1:23 PM    [address], [place], [place]  [address] Phone: [phone]; Fax: [phone]   ADDENDUM 02/27/22 2:41 PM  GATED SPECT MYO PERF W/LEXISCAN STRESS 1D 02/27/2022  Narrative   Findings are consistent with ischemia. The study is intermediate risk.   LV perfusion is abnormal. There is evidence of ischemia. Defect 1: There is a small defect with mild reduction in uptake present in the apical to mid  inferior location(s) that is reversible. There is normal wall motion in the defect area. Consistent with ischemia. Defect 2: There is a small defect with mild reduction in uptake present in the mid to basal inferoseptal location(s) that is reversible.   Left ventricular function is normal. Nuclear stress EF: 69 %. The left ventricular ejection fraction is  hyperdynamic (>65%). End diastolic cavity size is normal.   Prior study available for comparison from 04/07/2020. Compared to prior study, there is now mid-to-apical inferior and basal-to-mid inferoseptal ischemia.    Reviewed findings with Dr. Irish Lack. Defect is small. It is reasonable to continue med Rx if asymptomatic.  I called the patient. She did have to take NTG the other night for back pain which is her anginal equivalent. She leaves this weekend for a cruise and would like to know that she is ok. She will be on a cruise ship for a week. After that, she has a trip scheduled to go to [place]. She has reduced her activity significantly since her visit last week. She notes significant shortness of breath while on the treadmill today.  PLAN:  Arrange cardiac catheterization with Dr. Irish Lack on Friday 8/18. Labs done on 8/11 (Creatinine, Hgb normal). Continue Toprol XL, Imdur, Plavix, Lipitor Hold Xarelto x 2 days prior to cath  Shared Decision Making/Informed Consent The risks [stroke (1 in 1000), death (1 in 1000), kidney failure [usually temporary] (1 in 500), bleeding (1 in 200), allergic reaction [possibly serious] (1 in 200)], benefits (diagnostic support and management of coronary artery disease) and alternatives of a cardiac catheterization were discussed in detail with Ms. Leath and she is willing to proceed.  Richardson Dopp, PA-C    02/27/2022 2:45 PM

## 2022-02-23 NOTE — Assessment & Plan Note (Signed)
Maintaining sinus rhythm.  She is tolerating anticoagulation.  Obtain follow-up CMET, CBC.  Continue Xarelto 20 mg daily.

## 2022-02-23 NOTE — Assessment & Plan Note (Signed)
History of prior PCI with DES to the RCA in 2020 and repeat PCI with DES to the RCA secondary to in-stent restenosis in December 2022.  As noted, she has had some recurrent chest discomfort as well as back pain and shortness of breath.  Her symptoms are not exactly like her previous angina.  However, she is significantly concerned.  As noted, her electrocardiogram is reassuring.  Proceed with stress testing as outlined.  Continue Toprol-XL 25 mg daily, Lipitor 40 mg daily, Plavix 85 mg daily.  Resume isosorbide mononitrate 15 mg daily.  Follow-up in 3 months.

## 2022-02-23 NOTE — Assessment & Plan Note (Signed)
Blood pressure is well-controlled.  Obtain follow-up CMET, magnesium.  Continue Toprol-XL 25 mg daily, Altace 2.5 mg daily.

## 2022-02-24 LAB — CBC
Hematocrit: 39.2 % (ref 34.0–46.6)
Hemoglobin: 12.9 g/dL (ref 11.1–15.9)
MCH: 28.9 pg (ref 26.6–33.0)
MCHC: 32.9 g/dL (ref 31.5–35.7)
MCV: 88 fL (ref 79–97)
Platelets: 230 10*3/uL (ref 150–450)
RBC: 4.46 x10E6/uL (ref 3.77–5.28)
RDW: 14.5 % (ref 11.7–15.4)
WBC: 7.3 10*3/uL (ref 3.4–10.8)

## 2022-02-24 LAB — COMPREHENSIVE METABOLIC PANEL
ALT: 45 IU/L — ABNORMAL HIGH (ref 0–32)
AST: 38 IU/L (ref 0–40)
Albumin/Globulin Ratio: 1.6 (ref 1.2–2.2)
Albumin: 4.6 g/dL (ref 3.8–4.9)
Alkaline Phosphatase: 86 IU/L (ref 44–121)
BUN/Creatinine Ratio: 13 (ref 9–23)
BUN: 14 mg/dL (ref 6–24)
Bilirubin Total: 1.2 mg/dL (ref 0.0–1.2)
CO2: 22 mmol/L (ref 20–29)
Calcium: 9.7 mg/dL (ref 8.7–10.2)
Chloride: 99 mmol/L (ref 96–106)
Creatinine, Ser: 1.05 mg/dL — ABNORMAL HIGH (ref 0.57–1.00)
Globulin, Total: 2.9 g/dL (ref 1.5–4.5)
Glucose: 79 mg/dL (ref 70–99)
Potassium: 4.4 mmol/L (ref 3.5–5.2)
Sodium: 140 mmol/L (ref 134–144)
Total Protein: 7.5 g/dL (ref 6.0–8.5)
eGFR: 62 mL/min/{1.73_m2} (ref >59)

## 2022-02-24 LAB — LIPID PANEL
Chol/HDL Ratio: 2 ratio (ref 0.0–4.4)
Cholesterol, Total: 133 mg/dL (ref 100–199)
HDL: 68 mg/dL (ref >39)
LDL Chol Calc (NIH): 46 mg/dL (ref 0–99)
Triglycerides: 104 mg/dL (ref 0–149)
VLDL Cholesterol Cal: 19 mg/dL (ref 5–40)

## 2022-02-24 LAB — HEMOGLOBIN A1C
Est. average glucose Bld gHb Est-mCnc: 171 mg/dL
Hgb A1c MFr Bld: 7.6 % — ABNORMAL HIGH (ref 4.8–5.6)

## 2022-02-24 LAB — PRO B NATRIURETIC PEPTIDE: NT-Pro BNP: 348 pg/mL — ABNORMAL HIGH (ref 0–287)

## 2022-02-24 LAB — MAGNESIUM: Magnesium: 1.9 mg/dL (ref 1.6–2.3)

## 2022-02-26 ENCOUNTER — Other Ambulatory Visit: Payer: Self-pay | Admitting: *Deleted

## 2022-02-26 ENCOUNTER — Other Ambulatory Visit: Payer: Self-pay | Admitting: Physician Assistant

## 2022-02-26 ENCOUNTER — Telehealth: Payer: Self-pay | Admitting: Interventional Cardiology

## 2022-02-26 ENCOUNTER — Telehealth (HOSPITAL_COMMUNITY): Payer: Self-pay

## 2022-02-26 DIAGNOSIS — I2510 Atherosclerotic heart disease of native coronary artery without angina pectoris: Secondary | ICD-10-CM

## 2022-02-26 DIAGNOSIS — R072 Precordial pain: Secondary | ICD-10-CM

## 2022-02-26 MED ORDER — MAGNESIUM OXIDE 400 MG PO TABS: 400.0000 mg | ORAL_TABLET | Freq: Three times a day (TID) | ORAL | 3 refills | Status: DC

## 2022-02-26 NOTE — Telephone Encounter (Signed)
Labs faxed to PCP as requested.

## 2022-02-26 NOTE — Telephone Encounter (Signed)
Pt is requesting all her lab results from 02/23/22 be faxed over to her PCP.     Fax number - [phone]

## 2022-02-26 NOTE — Telephone Encounter (Signed)
Spoke with the patient, detailed instructions given. Asked to call back with any questions. She stated that she would be here for her test. S.Flavio Lindroth EMTP

## 2022-02-26 NOTE — Progress Notes (Signed)
Pt has been made aware of normal result and verbalized understanding.  jw

## 2022-02-27 ENCOUNTER — Ambulatory Visit: Payer: 59 | Admitting: Nurse Practitioner

## 2022-02-27 ENCOUNTER — Encounter: Payer: Self-pay | Admitting: *Deleted

## 2022-02-27 ENCOUNTER — Ambulatory Visit (HOSPITAL_COMMUNITY): Payer: 59 | Attending: Physician Assistant

## 2022-02-27 ENCOUNTER — Telehealth: Payer: Self-pay | Admitting: *Deleted

## 2022-02-27 DIAGNOSIS — I2510 Atherosclerotic heart disease of native coronary artery without angina pectoris: Secondary | ICD-10-CM | POA: Diagnosis not present

## 2022-02-27 DIAGNOSIS — R072 Precordial pain: Secondary | ICD-10-CM | POA: Diagnosis present

## 2022-02-27 LAB — MYOCARDIAL PERFUSION IMAGING
LV dias vol: 65 mL (ref 46–106)
LV sys vol: 20 mL
Nuc Stress EF: 69 %
Peak HR: 123 {beats}/min
Rest HR: 67 {beats}/min
Rest Nuclear Isotope Dose: 10.4 mCi
SDS: 8
SRS: 0
SSS: 8
Stress Nuclear Isotope Dose: 30.9 mCi
TID: 0.92

## 2022-02-27 MED ORDER — REGADENOSON 0.4 MG/5ML IV SOLN
0.4000 mg | Freq: Once | INTRAVENOUS | Status: AC
  Administered 2022-02-27: 0.4 mg via INTRAVENOUS

## 2022-02-27 MED ORDER — TECHNETIUM TC 99M TETROFOSMIN IV KIT
30.9000 | PACK | Freq: Once | INTRAVENOUS | Status: AC | PRN
  Administered 2022-02-27: 30.9 via INTRAVENOUS

## 2022-02-27 MED ORDER — TECHNETIUM TC 99M TETROFOSMIN IV KIT
10.4000 | PACK | Freq: Once | INTRAVENOUS | Status: AC | PRN
  Administered 2022-02-27: 10.4 via INTRAVENOUS

## 2022-02-27 NOTE — Telephone Encounter (Signed)
Call placed to pt regarding stress test results and recommendations.  Pt has been scheduled for a Left Heart Cath, 03/02/22 10:30 with Dr. Irish Lack.    [facility] OFFICE [place], [place] [address] Dept: [phone] Loc: [place]  02/27/2022  You are scheduled for a Cardiac Catheterization on Friday, August 18 with Dr. Larae Grooms.  1. Please arrive at the Main Entrance A at [facility]: [place], [place] [address] at 8:30 AM (This time is two hours before your procedure to ensure your preparation). Free valet parking service is available.   Special note: Every effort is made to have your procedure done on time. Please understand that emergencies sometimes delay scheduled procedures.  2. Diet: Do not eat solid foods after midnight.  You may have clear liquids until 5 AM upon the day of the procedure.  3. Labs: ALREADY DONE  4. Medication instructions in preparation for your procedure:   Contrast Allergy: No   HOLD THE XARELTO [facility] & THURSDAY AND DO NOT TAKE UNTIL AFTER YOUR PROCEDURE ON FRIDAY   Stop taking, Lasix (Furosemide)  Friday, August 18,  Take only 9 units of insulin the night before your procedure. Do not take any insulin on the day of the procedure.   On the morning of your procedure, take Aspirin and Plavix/Clopidogrel and any morning medicines NOT listed above.  You may use sips of water.  5. Plan to go home the same day, you will only stay overnight if medically necessary. 6. You MUST have a responsible adult to drive you home. 7. An adult MUST be with you the first 24 hours after you arrive home. 8. Bring a current list of your medications, and the last time and date medication taken. 9. Bring ID and current insurance cards. 10.Please wear clothes that are easy to get on and off and wear slip-on shoes.  Thank  you for allowing [place] to care for you!   -- [facility]

## 2022-02-27 NOTE — Telephone Encounter (Signed)
Per Nicki Reaper, had to call pt back and let her know that  we have changed the date of her cath to 03/01/22 with Dr. Tamala Julian and she is aware to arrive 10:00 a.m and not to take any Xarelto 8/16 or 8/17.  She verbalized understanding.

## 2022-02-27 NOTE — Addendum Note (Signed)
Addended byKathlen Mody, Nicki Reaper T on: 02/27/2022 02:48 PM   Modules accepted: Orders

## 2022-02-27 NOTE — Telephone Encounter (Signed)
-----   Message from Liliane Shi, [place] sent at 02/27/2022  2:40 PM EDT ----- Intermediate risk study with inf and inf-sept ischemia. Normal EF. Reviewed with Dr. Irish Lack. Ok to cath if still having symptoms on med Rx. Called pt. She had to take NTG the other night with relief of back pain (anginal equiv). PLAN:  -Arrange cardiac catheterization this week. Risks and benefits discussed. She agrees. Richardson Dopp, PA-C    02/27/2022 2:38 PM

## 2022-02-28 ENCOUNTER — Telehealth: Payer: Self-pay | Admitting: *Deleted

## 2022-02-28 NOTE — H&P (Signed)
Prior stents of the right coronary 2020 and 2022 (November). Needed XB RCA to deliver stent. Currently having chest discomfort post exertion. Recent nuclear stress test demonstrated a inferior wall ischemia.

## 2022-02-28 NOTE — Telephone Encounter (Signed)
Cardiac Catheterization scheduled at [facility] for: Thursday March 01, 2022 12 Noon Arrival time and place: [place] Entrance A at: 10 AM   Nothing to eat after midnight prior to procedure, clear liquids until 5 AM day of procedure.  Medication instructions: -Hold:  Xarelto-last dose 02/27/22 7 AM-knows to hold until post procedure  Insulin-AM of procedure/1/2 usual dose HS prior to procedure  Lasix-AM of procedure -Except hold medications usual morning medications can be taken with sips of water including aspirin 81 mg and Plavix 75 mg  Confirmed patient has responsible adult to drive home post procedure and be with patient first 24 hours after arriving home.  Patient reports no new symptoms concerning for COVID-19 in the past 10 days.  Reviewed procedure instructions with patient.

## 2022-03-02 ENCOUNTER — Ambulatory Visit (HOSPITAL_COMMUNITY)
Admission: RE | Admit: 2022-03-02 | Discharge: 2022-03-02 | Disposition: A | Discharge status: Home / Self Care | Payer: 59 | Attending: Interventional Cardiology | Admitting: Interventional Cardiology

## 2022-03-02 ENCOUNTER — Encounter (HOSPITAL_COMMUNITY)
Admission: RE | Disposition: A | Discharge status: Home / Self Care | Payer: Self-pay | Source: Home / Self Care | Attending: Interventional Cardiology

## 2022-03-02 ENCOUNTER — Other Ambulatory Visit: Payer: Self-pay

## 2022-03-02 DIAGNOSIS — E119 Type 2 diabetes mellitus without complications: Secondary | ICD-10-CM | POA: Diagnosis not present

## 2022-03-02 DIAGNOSIS — I25708 Atherosclerosis of coronary artery bypass graft(s), unspecified, with other forms of angina pectoris: Secondary | ICD-10-CM | POA: Insufficient documentation

## 2022-03-02 DIAGNOSIS — Z955 Presence of coronary angioplasty implant and graft: Secondary | ICD-10-CM | POA: Insufficient documentation

## 2022-03-02 DIAGNOSIS — I480 Paroxysmal atrial fibrillation: Secondary | ICD-10-CM | POA: Diagnosis not present

## 2022-03-02 DIAGNOSIS — Z79899 Other long term (current) drug therapy: Secondary | ICD-10-CM | POA: Diagnosis not present

## 2022-03-02 DIAGNOSIS — I25118 Atherosclerotic heart disease of native coronary artery with other forms of angina pectoris: Secondary | ICD-10-CM | POA: Diagnosis not present

## 2022-03-02 DIAGNOSIS — I10 Essential (primary) hypertension: Secondary | ICD-10-CM | POA: Insufficient documentation

## 2022-03-02 DIAGNOSIS — I25709 Atherosclerosis of coronary artery bypass graft(s), unspecified, with unspecified angina pectoris: Secondary | ICD-10-CM

## 2022-03-02 DIAGNOSIS — E785 Hyperlipidemia, unspecified: Secondary | ICD-10-CM | POA: Diagnosis not present

## 2022-03-02 DIAGNOSIS — I2510 Atherosclerotic heart disease of native coronary artery without angina pectoris: Secondary | ICD-10-CM

## 2022-03-02 HISTORY — PX: CORONARY STENT INTERVENTION: CATH118234

## 2022-03-02 HISTORY — PX: CORONARY LITHOTRIPSY: CATH118330

## 2022-03-02 HISTORY — PX: INTRAVASCULAR ULTRASOUND/IVUS: CATH118244

## 2022-03-02 HISTORY — PX: LEFT HEART CATH AND CORONARY ANGIOGRAPHY: CATH118249

## 2022-03-02 HISTORY — PX: CORONARY ULTRASOUND/IVUS: CATH118244

## 2022-03-02 LAB — GLUCOSE, CAPILLARY
Glucose-Capillary: 126 mg/dL — ABNORMAL HIGH (ref 70–99)
Glucose-Capillary: 87 mg/dL (ref 70–99)

## 2022-03-02 LAB — POCT ACTIVATED CLOTTING TIME
Activated Clotting Time: 347 seconds
Activated Clotting Time: 414 seconds

## 2022-03-02 SURGERY — LEFT HEART CATH AND CORONARY ANGIOGRAPHY
Anesthesia: LOCAL

## 2022-03-02 MED ORDER — ASPIRIN 81 MG PO CHEW
81.0000 mg | CHEWABLE_TABLET | ORAL | Status: AC
  Administered 2022-03-02: 81 mg via ORAL

## 2022-03-02 MED ORDER — MIDAZOLAM HCL 2 MG/2ML IJ SOLN
INTRAMUSCULAR | Status: AC
  Filled 2022-03-02: qty 2

## 2022-03-02 MED ORDER — HEPARIN (PORCINE) IN NACL 1000-0.9 UT/500ML-% IV SOLN
INTRAVENOUS | Status: AC
  Filled 2022-03-02: qty 1000

## 2022-03-02 MED ORDER — MIDAZOLAM HCL 2 MG/2ML IJ SOLN
INTRAMUSCULAR | Status: DC | PRN
  Administered 2022-03-02: 1 mg via INTRAVENOUS
  Administered 2022-03-02: 2 mg via INTRAVENOUS
  Administered 2022-03-02 (×4): 1 mg via INTRAVENOUS

## 2022-03-02 MED ORDER — HEPARIN (PORCINE) IN NACL 1000-0.9 UT/500ML-% IV SOLN
INTRAVENOUS | Status: DC | PRN
  Administered 2022-03-02 (×2): 500 mL

## 2022-03-02 MED ORDER — SODIUM CHLORIDE 0.9% FLUSH: 3.0000 mL | INTRAVENOUS | Status: DC | PRN

## 2022-03-02 MED ORDER — IOHEXOL 350 MG/ML SOLN
INTRAVENOUS | Status: DC | PRN
  Administered 2022-03-02: 95 mL

## 2022-03-02 MED ORDER — VERAPAMIL HCL 2.5 MG/ML IV SOLN
INTRAVENOUS | Status: DC | PRN
  Administered 2022-03-02: 10 mL via INTRA_ARTERIAL

## 2022-03-02 MED ORDER — LIDOCAINE HCL (PF) 1 % IJ SOLN
INTRAMUSCULAR | Status: AC
Start: 2022-03-02 — End: ?
  Filled 2022-03-02: qty 30

## 2022-03-02 MED ORDER — NITROGLYCERIN 1 MG/10 ML FOR IR/CATH LAB
INTRA_ARTERIAL | Status: AC
  Filled 2022-03-02: qty 10

## 2022-03-02 MED ORDER — ASPIRIN 81 MG PO CHEW: 81.0000 mg | CHEWABLE_TABLET | Freq: Every day | ORAL | Status: DC

## 2022-03-02 MED ORDER — VERAPAMIL HCL 2.5 MG/ML IV SOLN
INTRAVENOUS | Status: AC
  Filled 2022-03-02: qty 2

## 2022-03-02 MED ORDER — SODIUM CHLORIDE 0.9% FLUSH: 3.0000 mL | Freq: Two times a day (BID) | INTRAVENOUS | Status: DC

## 2022-03-02 MED ORDER — ONDANSETRON HCL 4 MG/2ML IJ SOLN: 4.0000 mg | Freq: Four times a day (QID) | INTRAMUSCULAR | Status: DC | PRN

## 2022-03-02 MED ORDER — CLOPIDOGREL BISULFATE 75 MG PO TABS: 75.0000 mg | ORAL_TABLET | Freq: Every day | ORAL | Status: DC

## 2022-03-02 MED ORDER — FENTANYL CITRATE (PF) 100 MCG/2ML IJ SOLN
INTRAMUSCULAR | Status: AC
  Filled 2022-03-02: qty 2

## 2022-03-02 MED ORDER — HYDRALAZINE HCL 20 MG/ML IJ SOLN: 10.0000 mg | INTRAMUSCULAR | Status: DC | PRN

## 2022-03-02 MED ORDER — FENTANYL CITRATE (PF) 100 MCG/2ML IJ SOLN
INTRAMUSCULAR | Status: DC | PRN
  Administered 2022-03-02 (×7): 25 ug via INTRAVENOUS

## 2022-03-02 MED ORDER — LABETALOL HCL 5 MG/ML IV SOLN: 10.0000 mg | INTRAVENOUS | Status: DC | PRN

## 2022-03-02 MED ORDER — LIDOCAINE HCL (PF) 1 % IJ SOLN
INTRAMUSCULAR | Status: AC
  Filled 2022-03-02: qty 30

## 2022-03-02 MED ORDER — NITROGLYCERIN 1 MG/10 ML FOR IR/CATH LAB
INTRA_ARTERIAL | Status: DC | PRN
  Administered 2022-03-02 (×2): 200 ug via INTRACORONARY

## 2022-03-02 MED ORDER — SODIUM CHLORIDE 0.9 % IV SOLN
250.0000 mL | INTRAVENOUS | Status: DC | PRN
Start: 2022-03-02 — End: 2022-03-03

## 2022-03-02 MED ORDER — ACETAMINOPHEN 325 MG PO TABS
650.0000 mg | ORAL_TABLET | ORAL | Status: DC | PRN
Start: 2022-03-02 — End: 2022-03-03

## 2022-03-02 MED ORDER — ASPIRIN 81 MG PO TBEC: 81.0000 mg | DELAYED_RELEASE_TABLET | Freq: Every day | ORAL | 0 refills | Status: AC

## 2022-03-02 MED ORDER — SODIUM CHLORIDE 0.9 % IV SOLN: INTRAVENOUS | Status: AC

## 2022-03-02 MED ORDER — SODIUM CHLORIDE 0.9 % WEIGHT BASED INFUSION
3.0000 mL/kg/h | INTRAVENOUS | Status: AC
  Administered 2022-03-02: 3 mL/kg/h via INTRAVENOUS

## 2022-03-02 MED ORDER — SODIUM CHLORIDE 0.9 % WEIGHT BASED INFUSION: 1.0000 mL/kg/h | INTRAVENOUS | Status: DC

## 2022-03-02 MED ORDER — LIDOCAINE HCL (PF) 1 % IJ SOLN
INTRAMUSCULAR | Status: DC | PRN
  Administered 2022-03-02: 2 mL via INTRADERMAL

## 2022-03-02 MED ORDER — HEPARIN SODIUM (PORCINE) 1000 UNIT/ML IJ SOLN
INTRAMUSCULAR | Status: DC | PRN
  Administered 2022-03-02: 2000 [IU] via INTRAVENOUS
  Administered 2022-03-02: 6000 [IU] via INTRAVENOUS
  Administered 2022-03-02: 5000 [IU] via INTRAVENOUS

## 2022-03-02 SURGICAL SUPPLY — 25 items
BALLN SAPPHIRE 2.0X12 (BALLOONS) ×1
BALLN ~~LOC~~ EMERGE MR 3.5X15 (BALLOONS) ×1
BALLOON SAPPHIRE 2.0X12 (BALLOONS) IMPLANT
BALLOON ~~LOC~~ EMERGE MR 3.5X15 (BALLOONS) IMPLANT
CATH 5FR JL3.5 JR4 ANG PIG MP (CATHETERS) IMPLANT
CATH OPTICROSS HD (CATHETERS) IMPLANT
CATH SHOCKWAVE 3.0X12 (CATHETERS) IMPLANT
CATH TELESCOPE 6F GEC (CATHETERS) IMPLANT
CATH VISTA GUIDE 6FR XBRCA (CATHETERS) IMPLANT
CATHETER SHOCKWAVE 3.0X12 (CATHETERS) ×1
DEVICE RAD COMP TR BAND LRG (VASCULAR PRODUCTS) IMPLANT
GLIDESHEATH SLEND SS 6F .021 (SHEATH) IMPLANT
GUIDEWIRE INQWIRE 1.5J.035X260 (WIRE) IMPLANT
INQWIRE 1.5J .035X260CM (WIRE) ×1
KIT ENCORE 26 ADVANTAGE (KITS) IMPLANT
KIT HEART LEFT (KITS) ×1 IMPLANT
PACK CARDIAC CATHETERIZATION (CUSTOM PROCEDURE TRAY) ×1 IMPLANT
SLED PULL BACK IVUS (MISCELLANEOUS) IMPLANT
STENT SYNERGY XD 3.50X12 (Permanent Stent) IMPLANT
SYNERGY XD 3.50X12 (Permanent Stent) ×1 IMPLANT
TRANSDUCER W/STOPCOCK (MISCELLANEOUS) ×1 IMPLANT
TUBING CIL FLEX 10 FLL-RA (TUBING) ×1 IMPLANT
VALVE GUARDIAN II ~~LOC~~ HEMO (MISCELLANEOUS) IMPLANT
WIRE ASAHI GRAND SLAM 180CM (WIRE) IMPLANT
WIRE ASAHI PROWATER 180CM (WIRE) IMPLANT

## 2022-03-02 NOTE — Progress Notes (Signed)
TR BAND REMOVAL  LOCATION:    right radial  DEFLATED PER PROTOCOL:    Yes.    TIME BAND OFF / DRESSING APPLIED: 03/02/22 at [place] ARRIVAL:    Level 0  SITE AFTER BAND REMOVAL:    Level 0  CIRCULATION SENSATION AND MOVEMENT:    Within Normal Limits   Yes.    COMMENTS:

## 2022-03-02 NOTE — Discharge Summary (Cosign Needed)
Discharge Summary for Same Day PCI   Patient ID: Courtney Olson MRN: [id number]; DOB: 08-02-1965  Admit date: 03/02/2022 Discharge date: 03/02/2022  Primary Care Provider: Orpah Melter, MD  Primary Cardiologist: Larae Grooms, MD  Primary Electrophysiologist:  None   Discharge Diagnoses    Active Problems:   CAD (coronary artery disease)    Diagnostic Studies/Procedures    Cardiac Catheterization 03/02/2022:   Intervention        Ost Cx to Mid Cx lesion is 25% stenosed.   Dist RCA lesion is 25% stenosed.   Prox RCA lesion is 25% stenosed.   2nd Diag lesion is 60% stenosed.   1st Diag lesion is 60% stenosed.   Mid RCA-1 lesion is 90% stenosed.  A drug-eluting stent was successfully placed using a SYNERGY XD 3.50X12, dilated to high pressure and optimized with intravascular ultrasound.   Post intervention, there is a 0% residual stenosis.   Mid RCA-3 lesion is 95% stenosed.  Treated with balloon angioplasty followed by coronary lithotripsy with 3.0 x 12 mm balloon, and 3.5 [place] balloon.   Post intervention, there is a 0% residual stenosis.   Non-stenotic Mid RCA-2 lesion was previously treated.   Successful PCI of the mid RCA with intravascular lithotripsy.  Stent placed into the more proximal lesion across the proximal edge of prior stent.  The in-stent restenosis in the prior overlap area was treated with shockwave balloon and [place] balloon.   XB RCA guide along with telescope guide extension catheter provided enough support to deliver the shockwave balloon.   Continue aspirin and clopidogrel.  Restart Xarelto tomorrow.  After 1 week, can stop aspirin and continue Xarelto and clopidogrel.  I instructed her to take her Xarelto with her largest meal of the day, typically in the evening.   I spoke to the husband.  All questions were answered.  We discussed the importance of risk factor modification. _____________   History of Present Illness     Per admission H&P on  02/23/22:  Courtney Olson is a 56 y.o. female with PMH of CAD s/p RCA stent 2020 and 2022, paroxysmal atrial fibrillation on Xarelto, HTN, DM, OSA, HLD, ulcerative colitis, rheumatoid arthritis, was evaluated at the office on 02/23/22 by [facility] Dopp APP for chest /back pain and SOB. She was last seen by Dr. Irish Lack in Dec 2022 after her most recent PCI. Prior to both PCI's, she had thoracic back pain that was brought on by certain positional changes as well as significant shortness of breath with exertion.  She has recently gained about 10 pounds.  She was getting ready to go on a cruise in a little over 1 week and will be gone for about 1 month.  She has recently increased activity to try to lose some weight prior to her trip.  With the sudden increase in activity, she has noted more shortness of breath with activities.  She also describes a heaviness/tightness in her chest towards the end of activity.  She has continued to have the same back pain since both of her PCI's.  It does not sound as though this symptom has ever completely resolved.  She went for a routine visit with her PCP 02/22/22.  She mentioned the symptoms during her visit and had an electrocardiogram.  It was felt that her electrocardiogram was somewhat abnormal and she was asked to follow-up with cardiology.  She brought the EKG in which was reviewed by Dr. Irish Lack.  He did not feel that  there were any significant changes.  She has not had orthopnea, leg edema or syncope.  She has had issues with vasovagal syncope in the past.  She had a recurrent episode of diarrhea while shopping recently and had lightheadedness, near syncope after this episode.  She was arranged for NM stress test on 02/27/22 which showed intermediate risk study, 2 defects consistent with inferior and inferoseptal ischemia. LVEF 69%.   Cardiac catheterization was arranged for further evaluation on 03/02/22.   Hospital Course     The patient underwent cardiac cath as  noted above with Dr Irish Lack. Plan for DAPT with ASA/Plavix for at least 6 months. The patient was seen by cardiac rehab while in short stay. There were no observed complications post cath. Radial cath site was re-evaluated prior to discharge and found to be stable without any complications. Instructions/precautions regarding cath site care were given prior to discharge.  Courtney Olson was seen by Dr. Gregary Cromer and determined stable for discharge home. Follow up with our office has been arranged on 03/20/22, she refused 03/09/22 visit as she has travel plans. Discussed with Dr. Lavon Paganini, [place] to travel if patient absolutely wishes to. Advised the patient go to the nearest ER if experience chest pain, SOB, dizziness, syncope, etc.  Medications are listed below. Pertinent changes include start ASA '81mg'$  daily for 1 week in addition to PTA meds Plavix '75mg'$  daily and Xarelto '20mg'$  daily. Self stopped Imdur due to headache, OK continue PRN Nitro. No other change of home meds.   _____________  Cath/PCI Registry Performance & Quality Measures: Aspirin prescribed? - Yes ADP Receptor Inhibitor (Plavix/Clopidogrel, Brilinta/Ticagrelor or Effient/Prasugrel) prescribed (includes medically managed patients)? - Yes High Intensity Statin (Lipitor 40-'80mg'$  or Crestor 20-'40mg'$ ) prescribed? - Yes For EF <40%, was ACEI/ARB prescribed? - Not Applicable (EF >/= 78%) For EF <40%, Aldosterone Antagonist (Spironolactone or Eplerenone) prescribed? - Not Applicable (EF >/= 29%) Cardiac Rehab Phase II ordered (Included Medically managed Patients)? - Yes  _____________   Discharge Vitals Blood pressure 118/64, pulse 71, resp. rate (!) 21, last menstrual period 07/16/2000, SpO2 96 %.  There were no vitals filed for this visit.  Vitals:  Vitals:   03/02/22 1558 03/02/22 1618  BP: 100/67 118/64  Pulse: 67 71  Resp:    SpO2: 94% 96%   General Appearance: In no apparent distress, laying in chair  HEENT: Normocephalic,  atraumatic.  Cardiovascular: Regular rate and rhythm, normal S1-S2,  no murmur Respiratory: Resting breathing unlabored, lungs sounds clear to auscultation bilaterally, no use of accessory muscles. On room air.  No wheezes, rales or rhonchi.   Gastrointestinal: Bowel sounds positive, abdomen soft Extremities: Able to move all extremities in bed without difficulty, no edema Skin: Intact, warm, dry. Right radial site with dressing, no bleeding or hematoma, pulse 2+, minor bruise noted, no neurovascular deficit  Neurologic: Alert, oriented to person, place and time. Fluent speech, no facial droop, no cognitive deficit Psychiatric: Normal affect. Mood is appropriate.     Last Labs & Radiologic Studies    CBC No results for input(s): "WBC", "NEUTROABS", "HGB", "HCT", "MCV", "PLT" in the last 72 hours. Basic Metabolic Panel No results for input(s): "NA", "K", "CL", "CO2", "GLUCOSE", "BUN", "CREATININE", "CALCIUM", "MG", "PHOS" in the last 72 hours. Liver Function Tests No results for input(s): "AST", "ALT", "ALKPHOS", "BILITOT", "PROT", "ALBUMIN" in the last 72 hours. No results for input(s): "LIPASE", "AMYLASE" in the last 72 hours. High Sensitivity Troponin:   No results for input(s): "TROPONINIHS" in the  last 720 hours.  BNP Invalid input(s): "POCBNP" D-Dimer No results for input(s): "DDIMER" in the last 72 hours. Hemoglobin A1C No results for input(s): "HGBA1C" in the last 72 hours. Fasting Lipid Panel No results for input(s): "CHOL", "HDL", "LDLCALC", "TRIG", "CHOLHDL", "LDLDIRECT" in the last 72 hours. Thyroid Function Tests No results for input(s): "TSH", "T4TOTAL", "T3FREE", "THYROIDAB" in the last 72 hours.  Invalid input(s): "FREET3" _____________  CARDIAC CATHETERIZATION  Result Date: 03/02/2022   [place] Cx to Mid Cx lesion is 25% stenosed.   Dist RCA lesion is 25% stenosed.   Prox RCA lesion is 25% stenosed.   2nd Diag lesion is 60% stenosed.   1st Diag lesion is 60% stenosed.    Mid RCA-1 lesion is 90% stenosed.  A drug-eluting stent was successfully placed using a SYNERGY XD 3.50X12, dilated to high pressure and optimized with intravascular ultrasound.   Post intervention, there is a 0% residual stenosis.   Mid RCA-3 lesion is 95% stenosed.  Treated with balloon angioplasty followed by coronary lithotripsy with 3.0 x 12 mm balloon, and 3.5 [place] balloon.   Post intervention, there is a 0% residual stenosis.   Non-stenotic Mid RCA-2 lesion was previously treated. Successful PCI of the mid RCA with intravascular lithotripsy.  Stent placed into the more proximal lesion across the proximal edge of prior stent.  The in-stent restenosis in the prior overlap area was treated with shockwave balloon and [place] balloon. XB RCA guide along with telescope guide extension catheter provided enough support to deliver the shockwave balloon. Continue aspirin and clopidogrel.  Restart Xarelto tomorrow.  After 1 week, can stop aspirin and continue Xarelto and clopidogrel.  I instructed her to take her Xarelto with her largest meal of the day, typically in the evening. I spoke to the husband.  All questions were answered.  We discussed the importance of risk factor modification.   MYOCARDIAL PERFUSION IMAGING  Result Date: 02/27/2022   Findings are consistent with ischemia. The study is intermediate risk.   LV perfusion is abnormal. There is evidence of ischemia. Defect 1: There is a small defect with mild reduction in uptake present in the apical to mid inferior location(s) that is reversible. There is normal wall motion in the defect area. Consistent with ischemia. Defect 2: There is a small defect with mild reduction in uptake present in the mid to basal inferoseptal location(s) that is reversible.   Left ventricular function is normal. Nuclear stress EF: 69 %. The left ventricular ejection fraction is hyperdynamic (>65%). End diastolic cavity size is normal.   Prior study available for comparison from  04/07/2020. Compared to prior study, there is now mid-to-apical inferior and basal-to-mid inferoseptal ischemia.    Disposition   Pt is being discharged home today in good condition.  Follow-up Plans & Appointments     Follow-up Information     Liliane Shi, PA-C Follow up on 03/20/2022.   Specialties: Cardiology, Physician Assistant Why: at 10 am for cardiology follow up Contact information: [address] [place] [address] [phone]                Discharge Instructions     Amb Referral to Cardiac Rehabilitation   Complete by: As directed    Diagnosis: Coronary Stents   After initial evaluation and assessments completed: Virtual Based Care may be provided alone or in conjunction with Phase 2 Cardiac Rehab based on patient barriers.: Yes   Diet - low sodium heart healthy  Complete by: As directed    Discharge instructions   Complete by: As directed    Radial Site Care  Refer to this sheet in the next few weeks. These instructions provide you with information on caring for yourself after your procedure. Your caregiver may also give you more specific instructions. Your treatment has been planned according to current medical practices, but problems sometimes occur. Call your caregiver if you have any problems or questions after your procedure.  HOME CARE INSTRUCTIONS You may shower the day after the procedure. Remove the bandage (dressing) and gently wash the site with plain soap and water. Gently pat the site dry.  Do not apply powder or lotion to the site.  Do not submerge the affected site in water for 3 to 5 days.  Inspect the site at least twice daily.  Do not flex or bend the affected arm for 24 hours.  No lifting over 5 pounds (2.3 kg) for 5 days after your procedure.  Do not drive home if you are discharged the same day of the procedure. Have someone else drive you.  You may drive 24 hours after the procedure unless otherwise instructed  by your caregiver.   What to expect: Any bruising will usually fade within 1 to 2 weeks.  Blood that collects in the tissue (hematoma) may be painful to the touch. It should usually decrease in size and tenderness within 1 to 2 weeks.   SEEK IMMEDIATE MEDICAL CARE IF: You have unusual pain at the radial site.  You have redness, warmth, swelling, or pain at the radial site.  You have drainage (other than a small amount of blood on the dressing).  You have chills.  You have a fever or persistent symptoms for more than 72 hours.  You have a fever and your symptoms suddenly get worse.  Your arm becomes pale, cool, tingly, or numb.  You have heavy bleeding from the site. Hold pressure on the site.    PLEASE REMEMBER TO BRING ALL OF YOUR MEDICATIONS TO EACH OF YOUR FOLLOW-UP OFFICE VISITS.  PLEASE ATTEND ALL SCHEDULED FOLLOW-UP APPOINTMENTS.   Activity: Increase activity slowly as tolerated. You may shower, but no soaking baths (or swimming) for 1 week. No driving for 24 hours. No lifting over 5 lbs for 1 week. No sexual activity for 1 week.   You May Return to Work: in 2 weeks (if applicable)  Wound Care: You may wash cath site gently with soap and water. Keep cath site clean and dry. If you notice pain, swelling, bleeding or pus at your cath site, please call [phone].  PLEASE DO NOT MISS ANY DOSES OF YOUR ASPIRIN/PLAVIX for 1 week starting on 03/02/22. After 1 week, stop ASPIRIN, continue Plavix and Xarelto. IF you have issues obtaining this medication due to cost please CALL the office 3-5 business days prior to running out in order to prevent missing doses of this medication.   Also keep a log of you blood pressures and bring back to your follow up appt. Please call the office with any questions.   Patients taking blood thinners should generally stay away from medicines like ibuprofen, Advil, Motrin, naproxen, and Aleve due to risk of stomach bleeding. You may take Tylenol as  directed or talk to your primary doctor about alternatives.  If you experience chest pain, shortness of breath, dizziness, syncope, etc, Please head to the nearest ER if you have to travel.   Increase activity slowly   Complete by:  As directed         Discharge Medications   Allergies as of 03/02/2022       Reactions   Orange Fruit [citrus] Anaphylaxis   Empagliflozin Other (See Comments)   burning   Metformin Hcl Other (See Comments)   Gi upset   Nitrofurantoin Itching   Ears burning   Bactrim [sulfamethoxazole-trimethoprim] Rash   Other Rash   Skin irritation, burning and skin tearing, paper tape is good to use. Please do not remove the tape on IV at this point.        Medication List     STOP taking these medications    isosorbide mononitrate 30 MG 24 hr tablet Commonly known as: IMDUR       TAKE these medications    ALPRAZolam 0.25 MG tablet Commonly known as: XANAX Take 1 tablet (0.25 mg total) by mouth 2 (two) times daily as needed. What changed: reasons to take this   aspirin EC 81 MG tablet Take 1 tablet (81 mg total) by mouth daily for 7 days. Swallow whole. STOP after 7 days   atorvastatin 40 MG tablet Commonly known as: LIPITOR TAKE 1 TABLET BY MOUTH  DAILY   clopidogrel 75 MG tablet Commonly known as: PLAVIX Take 1 tablet (75 mg total) by mouth daily with breakfast.   EPINEPHrine 0.3 mg/0.3 mL Soaj injection Commonly known as: EPI-PEN Inject 0.3 mLs into the skin as needed.   ezetimibe 10 MG tablet Commonly known as: ZETIA TAKE 1 TABLET(10 MG) BY MOUTH DAILY   furosemide 20 MG tablet Commonly known as: LASIX Take 20 mg by mouth as needed for fluid or edema.   insulin lispro 100 UNIT/ML injection Commonly known as: HUMALOG Inject 18 Units into the skin 2 (two) times daily.   magnesium oxide 400 MG tablet Commonly known as: MAG-OX Take 1 tablet (400 mg total) by mouth in the morning, at noon, and at bedtime.   mesalamine 1.2 g EC  tablet Commonly known as: LIALDA Take 1.2 g by mouth daily.   metoprolol succinate 25 MG 24 hr tablet Commonly known as: TOPROL-XL TAKE 1 TABLET BY MOUTH  DAILY   multivitamin tablet Take 1 tablet by mouth daily. Pack Heart healthy   nitroGLYCERIN 0.4 MG SL tablet Commonly known as: Nitrostat Place 1 tablet (0.4 mg total) under the tongue every 5 (five) minutes as needed.   ramipril 2.5 MG capsule Commonly known as: ALTACE Take 2.5 mg by mouth daily.   rivaroxaban 20 MG Tabs tablet Commonly known as: Xarelto Take 1 tablet (20 mg total) by mouth daily.   Toujeo Max SoloStar 300 UNIT/ML Solostar Pen Generic drug: insulin glargine (2 Unit Dial) Inject 18 Units into the skin 2 (two) times daily.   VITAMIN D3 PO Take 1 capsule by mouth daily.           Allergies Allergies  Allergen Reactions   Orange Fruit [Citrus] Anaphylaxis   Empagliflozin Other (See Comments)    burning   Metformin Hcl Other (See Comments)    Gi upset   Nitrofurantoin Itching    Ears burning   Bactrim [Sulfamethoxazole-Trimethoprim] Rash   Other Rash    Skin irritation, burning and skin tearing, paper tape is good to use. Please do not remove the tape on IV at this point.    Outstanding Labs/Studies     Duration of Discharge Encounter   Greater than 30 minutes including physician time.  Signed, Margie Billet, NP 03/02/2022, 4:50 PM  I have examined the patient and reviewed assessment and plan and discussed with patient.  Agree with above as stated.    RIght radial site stable.  2+ pulse palpable.  No hematoma.  Restart Xarelto tomorrow. Short term aspirin with longer term Plavix.  Same day discharge.   Larae Grooms

## 2022-03-02 NOTE — Progress Notes (Signed)
CARDIAC REHAB PHASE I      Post stent education including heart healthy diabetic diet, risk factors, restrictions, exercise guidelines, antiplatelet therapy importance, site care, and CRP2. Will refer to [facility] for CRP2. All questions and concerns addressed.  [phone] Vanessa Barbara, RN BSN 03/02/2022 3:56 PM

## 2022-03-02 NOTE — Interval H&P Note (Signed)
Cath Lab Visit (complete for each Cath Lab visit)  Clinical Evaluation Leading to the Procedure:   ACS: No.  Non-ACS:    Anginal Classification: CCS III  Anti-ischemic medical therapy: Maximal Therapy (2 or more classes of medications)  Non-Invasive Test Results: Intermediate-risk stress test findings: cardiac mortality 1-3%/year  Prior CABG: No previous CABG   Patient with increasing anginal sx and abnormal stress test.  Prior RCA PCI.  Patient was supposed to have this done yesterday but took her Xarelto. We moved the procedure to today rather than waiting longer due to increasing symptoms.    History and Physical Interval Note:  03/02/2022 12:58 PM  Courtney Olson  has presented today for surgery, with the diagnosis of cad - angina - abnormal stress.  The various methods of treatment have been discussed with the patient and family. After consideration of risks, benefits and other options for treatment, the patient has consented to  Procedure(s): LEFT HEART CATH AND CORONARY ANGIOGRAPHY (N/A) as a surgical intervention.  The patient's history has been reviewed, patient examined, no change in status, stable for surgery.  I have reviewed the patient's chart and labs.  Questions were answered to the patient's satisfaction.     Larae Grooms

## 2022-03-05 ENCOUNTER — Encounter (HOSPITAL_COMMUNITY): Payer: Self-pay | Admitting: Interventional Cardiology

## 2022-03-05 ENCOUNTER — Telehealth: Payer: Self-pay | Admitting: Interventional Cardiology

## 2022-03-05 NOTE — Telephone Encounter (Signed)
Pt states that she had procedure done on Friday and states that she is still having pain in the same area before having procedure. Pt would like a callback. Please advise

## 2022-03-05 NOTE — Telephone Encounter (Signed)
Returned call to patient who reports she continues to have throbbing in her left upper back (same location as before heart cath). She states the throbbing is better than it was and only happens occasionally.  Patient states she was able to walk up her stairs today without getting short of breath.  Patient states she is feeling very anxious as she prepares for a trip to [facility]. She reports she took 2 nitroglycerin tablets today (with minimal relief). She states she cannot take Imdur as it caused her to have headaches all days long when she tried it in the past.  Patient states she has called her PCP to request refill on Xanax but was told she would need to come in for a medication visit. Patient states she is flying to [facility] tomorrow morning and an appt cannot be accommodated prior to this.  Patient is asking if Dr. Irish Lack would be willing to call in 3-4 tablets of Xanax 0.'25mg'$  BID PRN for flying.  Advised patient our office does not typically prescribe non-cardiac medication, but I will ask Dr. Irish Lack.

## 2022-03-05 NOTE — Telephone Encounter (Signed)
    I am ok with filling Xanax .25 mg PO daily prn flying Disp 4 tabs. No refills. One time only since trip is tomorrow.   Let her know that If she has a pill contract with another physician, this may be breaking the contract.    Please cc her PCP as well.  For our records only in the event someone else has to do a heart cath on her in the future: She has known about her upcoming travel for quite some time and told me in the past she uses Xanax when flying.  Not sure why she did not get her Xanax weeks in advance from PCP.  Of interest, she was difficult to sedate during the cath and complained about being awake despite high dose conscious sedation, approximately Versed (6 mg) and Fentanyl (140mg).  Made me question whether she has used enough oral benzodiazepines to have a tolerance to the IV meds.   VJettie Booze MD

## 2022-03-06 NOTE — Telephone Encounter (Signed)
Called and spoke to patient who states that she does not have a pill contract with another provider. She states that she has seen several different providers for her primary care as there is high turnover and no follow through. She states that she was last seen by Patience Musca, NP and has also been seen by Dr. Orpah Melter. Made patient aware that Dr. Irish Lack is willing to send in a ONE-TIME Rx for Xanax for her flight with no refills. Patient expresses her gratitude and requests Rx be sent to the [facility] [place].  Called the [facility] in [place] and provided a VERBAL ORDER for "Xanax 0.25 mg PO daily prn flying. Disp 4 tablets. No Refills."  Will route note to Dr. Orpah Melter and Patience Musca, NP as Juluis Rainier.

## 2022-03-09 ENCOUNTER — Ambulatory Visit: Payer: 59 | Admitting: Nurse Practitioner

## 2022-03-09 ENCOUNTER — Ambulatory Visit: Payer: 59 | Admitting: Physician Assistant

## 2022-03-19 NOTE — Progress Notes (Signed)
Cardiology Office Note:    Date:  03/20/2022   ID:  Courtney Olson, DOB 01-Jul-1966, MRN [id number]  PCP:  Orpah Melter, MD  [place] Providers Cardiologist:  Larae Grooms, MD    Referring MD: Orpah Melter, MD   Chief Complaint:  Hospitalization Follow-up (S/p cardiac catheterization )    Patient Profile: Coronary artery disease  S/p DES to RCA in 05/2019 S/p DES to RCA in 06/2021 due to ISR  S/p 3.5 x 12 mm Synergy DES to [facility] and shockwave lithotripsy to mRCA ISR in 02/2022 Paroxysmal atrial fibrillation  Rx w Rivaroxaban  Monitor 07/2021: no AFib Aortic atherosclerosis  Hypertension  Diabetes mellitus, insulin dependent  Hyperlipidemia  OSA Bradycardia Hx of vasovagal syncope  Hx of pancreatitis  S/p Renal mass excision (benign) Ulcerative colitis  Hx of CMV hepatitis  Rheumatoid arthritis   Prior CV Studies: LEFT HEART CATH 03/02/2022 D1 60, D2 60 LCx ostial 25 RCA proximal 25, mid 90, mid stent 95 ISR, distal 25 PCI: 3.5 x 12 mm Synergy DES to the mid RCA PCI: Balloon angioplasty followed by coronary lithotripsy to mid RCA stent  Successful PCI of the mid RCA with intravascular lithotripsy.  Stent placed into the more proximal lesion across the proximal edge of prior stent.  The in-stent restenosis in the prior overlap area was treated with shockwave balloon and Sonoita balloon.    GATED SPECT MYO PERF W/LEXISCAN STRESS 1D 02/27/2022 Intermediate risk, EF 69, mid to apical inferior and basal to mid inferoseptal ischemia  LONG TERM MONITOR (8-14 DAYS) INTERPRETATION 07/24/2021 Normal sinus rhythm with rare PACs and PVCs. // Rare, brief runs of PACs. // Patient symptoms correlated to PVCs. // No atrial fibrillation.   LEFT HEART CATH 06/27/2021 RCA proximal 25, mid stent distal edge 90 ISR (otherwise patent), distal 25  LCx ostial 25  D1 60, D2 60  EF 55-60 PCI: 2.5 x 15 mm Onyx frontier DES to the mid RCA  ECHO COMPLETE WO IMAGING  ENHANCING AGENT 07/07/2021 EF 60-65, no RWMA, normal RVSF, mild AV calcification, no aortic stenosis   GATED SPECT MYO PERF W/LEXISCAN STRESS 1D 04/07/2020 Normal stress nuclear study with no ischemia or infarction; gated EF 75; normal wall motion.   CT CORONARY MORPH W/CTA COR W/SCORE W/CA W/CM &/OR WO/CM 05/13/2019 Coronary calcium score of 2924. This was 99th percentile for age and sex matched control. Probable RCA CTO; mod pLAD (50-69)   History of Present Illness:   Courtney Olson is a 56 y.o. female with the above problem list.  She was last seen 02/23/22 with symptoms of chest pain and continued back pain. I placed her on Isosorbide mononitrate. A Nuclear stress test was arranged and this demonstrated EF 69, inf and inf-sept ischemia (intermediate risk). The patient was still having symptoms on GDMT. Therefore, we arranged cardiac catheterization. This demonstrated a de novo 90% mRCA lesion and a 95% mRCA in stent restenosis. The patient underwent PCI with a DES to the [facility] and shockwave lithotripsy to the ISR. She was DC the same day and is to take ASA, Plavix, Xarelto for 1 week. Then she will DC ASA and remain on Plavix and Xarelto.   She returns for f/u.  She is here alone.  She did have some interscapular back pain for couple days after her PCI.  However, this is resolved.  She has felt well since that time.  She just got back from a trip to [place] and a cruise.  She has not had chest discomfort, shortness of breath, syncope, orthopnea, leg edema.  She did have a hypoglycemic episode with diaphoresis and palpitations.  Symptoms resolved after a sugar load.  She does note dependent leg edema when she is on a cruise.  She does take as needed Lasix.  She is getting ready to travel to [place] and [place].     Past Medical History:  Diagnosis Date   Aortic calcification (HCC)    Atrial tachycardia (HCC)    Atrophic vaginitis 01/2006   Bradycardia 05/2014   CAD  (coronary artery disease)    cardiac cath 05/21/2019 showing 805 mRCA, 60% D1 and D2 that were very small and patent LAD and LCx.  She underwent DES to the [facility] with residual 25% stenosis in the dRCA   Chronic pancolonic ulcerative colitis ([place]) 2015   unknown if chronic.   CKD (chronic kidney disease), stage II    Cytomegaloviral hepatitis ([place]) 2005   Diabetes mellitus without complication ([place]) 20/2542   on Ace inhibitor to protect kidneys   Gallstones 01/2013   GERD (gastroesophageal reflux disease)    Hyperlipidemia    Hypertension    Kidney tumor 03/2013   Right   NSVT (nonsustained ventricular tachycardia) (HCC)    Obesity    OSA (obstructive sleep apnea)    PAF (paroxysmal atrial fibrillation) ([place])    Pancreatitis 2014   Postmenopausal    Premature atrial contractions    PVC's (premature ventricular contractions)    RA (rheumatoid arthritis) (HCC)    Syncope and collapse    Ulcerative colitis ([place]) 2015   Current Medications: Current Meds  Medication Sig   ALPRAZolam (XANAX) 0.25 MG tablet Take 1 tablet (0.25 mg total) by mouth 2 (two) times daily as needed. (Patient taking differently: Take 0.25 mg by mouth 2 (two) times daily as needed for anxiety (flying).)   atorvastatin (LIPITOR) 40 MG tablet TAKE 1 TABLET BY MOUTH  DAILY   Cholecalciferol (VITAMIN D3 PO) Take 1 capsule by mouth daily.   clopidogrel (PLAVIX) 75 MG tablet Take 1 tablet (75 mg total) by mouth daily with breakfast.   EPINEPHrine 0.3 mg/0.3 mL IJ SOAJ injection Inject 0.3 mLs into the skin as needed.   ezetimibe (ZETIA) 10 MG tablet TAKE 1 TABLET(10 MG) BY MOUTH DAILY   furosemide (LASIX) 20 MG tablet Take 20 mg by mouth as needed for fluid or edema.   insulin glargine, 2 Unit Dial, (TOUJEO MAX SOLOSTAR) 300 UNIT/ML Solostar Pen Inject 18 Units into the skin 2 (two) times daily.   insulin lispro (HUMALOG) 100 UNIT/ML injection Inject 18 Units into the skin 2 (two) times daily.   magnesium oxide (MAG-OX)  400 MG tablet Take 1 tablet (400 mg total) by mouth in the morning, at noon, and at bedtime.   mesalamine (LIALDA) 1.2 g EC tablet Take 1.2 g by mouth daily.    metoprolol succinate (TOPROL-XL) 25 MG 24 hr tablet TAKE 1 TABLET BY MOUTH  DAILY   Multiple Vitamin (MULTIVITAMIN) tablet Take 1 tablet by mouth daily. Pack Heart healthy   nitroGLYCERIN (NITROSTAT) 0.4 MG SL tablet Place 1 tablet (0.4 mg total) under the tongue every 5 (five) minutes as needed.   ramipril (ALTACE) 2.5 MG capsule Take 2.5 mg by mouth daily.   rivaroxaban (XARELTO) 20 MG TABS tablet Take 1 tablet (20 mg total) by mouth daily.   TRULICITY 4.5 HC/6.2BJ SOPN Inject into the skin once a week.  Allergies:   Orange fruit [citrus], Empagliflozin, Metformin hcl, Nitrofurantoin, Orange oil, Bactrim [sulfamethoxazole-trimethoprim], and Other   Social History   Tobacco Use   Smoking status: Never   Smokeless tobacco: Never  Vaping Use   Vaping Use: Never used  Substance Use Topics   Alcohol use: Yes    Comment: 2 a month   Drug use: No    Family Hx: The patient's family history includes Breast cancer (age of onset: 77) in her sister; Cancer in her maternal grandmother and paternal aunt; Diabetes in her father, maternal grandmother, mother, and paternal aunt; Fibromyalgia in her mother; Heart disease in her father; Hypertension in her father, mother, and sister.  Review of Systems  HENT:  Positive for hoarse voice.      EKGs/Labs/Other Test Reviewed:    EKG:  EKG is  ordered today.  The ekg ordered today demonstrates NSR, HR 74, T wave inversion in lead III, RSR prime in V1, QTc 435  Recent Labs: 06/20/2021: TSH 2.460 02/23/2022: ALT 45; BUN 14; Creatinine, Ser 1.05; Hemoglobin 12.9; Magnesium 1.9; NT-Pro BNP 348; Platelets 230; Potassium 4.4; Sodium 140   Recent Lipid Panel Recent Labs    02/23/22 1232  CHOL 133  TRIG 104  HDL 68  LDLCALC 46     Risk Assessment/Calculations/Metrics:    CHA2DS2-VASc  Score = 4   This indicates a 4.8% annual risk of stroke. The patient's score is based upon: CHF History: 0 HTN History: 1 Diabetes History: 1 Stroke History: 0 Vascular Disease History: 1 Age Score: 0 Gender Score: 1             Physical Exam:    VS:  BP 112/78   Pulse 74   Ht '5\' 5"'$  (1.651 m)   Wt 209 lb (94.8 kg)   LMP 07/16/2000 (Approximate)   SpO2 97%   BMI 34.78 kg/m     Wt Readings from Last 3 Encounters:  03/20/22 209 lb (94.8 kg)  02/27/22 209 lb (94.8 kg)  02/23/22 209 lb (94.8 kg)    Constitutional:      Appearance: Healthy appearance. Not in distress.  Neck:     Vascular: JVD normal.  Pulmonary:     Effort: Pulmonary effort is normal.     Breath sounds: No wheezing. No rales.  Cardiovascular:     Normal rate. Regular rhythm. Normal S1. Normal S2.      Murmurs: There is no murmur.     Comments: Right wrist with hematoma Edema:    Peripheral edema absent.  Abdominal:     Palpations: Abdomen is soft.  Skin:    General: Skin is warm and dry.  Neurological:     Mental Status: Alert and oriented to person, place and time.         ASSESSMENT & PLAN:   CAD (coronary artery disease) History of DES to the RCA in November 2020 and DES to the RCA secondary to in-stent restenosis in December 2022.  She recently had symptoms of angina and an abnormal Myoview.  Cardiac catheterization demonstrated 90% stenosis in the RCA prior to previous stent as well as 95% in-stent restenosis.  She was treated with DES to the mid RCA and angioplasty with shockwave lithotripsy to the in-stent restenosis.  She has done well since her procedure without recurrent anginal symptoms.  Electrocardiogram appears stable.  She took aspirin for 1 week and is now on Plavix in addition to Xarelto.  I have recommended she proceed with cardiac  rehab.  She is getting ready to travel and will not be home until late September. Continue Plavix 75 mg daily, Lipitor 40 mg daily, Toprol-XL 25 mg  daily Proceed with cardiac rehab Follow-up 2 to 3 months  PAF (paroxysmal atrial fibrillation) ([place]) She is maintaining sinus rhythm.  Creatinine clearance 90.  Continue Xarelto 20 mg daily.  Hypertension Blood pressure well controlled.  Continue Toprol-XL 25 mg daily, Altace 2.5 mg daily.  Hyperlipidemia LDL optimal on most recent lab work.  Continue current Rx.       Cardiac Rehabilitation Eligibility Assessment  The patient is ready to start cardiac rehabilitation from a cardiac standpoint. (Pt going to [place] soon and will not be home until late Sept.)          Dispo:  Return in about 2 months (around 05/20/2022) for Routine follow up in 2 months with Dr.Varanasi.   Medication Adjustments/Labs and Tests Ordered: Current medicines are reviewed at length with the patient today.  Concerns regarding medicines are outlined above.  Tests Ordered: Orders Placed This Encounter  Procedures   EKG 12-Lead   Medication Changes: No orders of the defined types were placed in this encounter.  Signed, Richardson Dopp, PA-C  03/20/2022 11:31 AM    [facility] [address], [place], [place]  [address] Phone: [phone]; Fax: [phone]

## 2022-03-20 ENCOUNTER — Ambulatory Visit: Payer: 59 | Attending: Physician Assistant | Admitting: Physician Assistant

## 2022-03-20 ENCOUNTER — Encounter: Payer: Self-pay | Admitting: Physician Assistant

## 2022-03-20 VITALS — BP 112/78 | HR 74 | Ht 65.0 in | Wt 209.0 lb

## 2022-03-20 DIAGNOSIS — I2510 Atherosclerotic heart disease of native coronary artery without angina pectoris: Secondary | ICD-10-CM | POA: Diagnosis not present

## 2022-03-20 DIAGNOSIS — I10 Essential (primary) hypertension: Secondary | ICD-10-CM | POA: Diagnosis not present

## 2022-03-20 DIAGNOSIS — I480 Paroxysmal atrial fibrillation: Secondary | ICD-10-CM | POA: Diagnosis not present

## 2022-03-20 DIAGNOSIS — E7800 Pure hypercholesterolemia, unspecified: Secondary | ICD-10-CM

## 2022-03-20 NOTE — Assessment & Plan Note (Addendum)
History of DES to the RCA in November 2020 and DES to the RCA secondary to in-stent restenosis in December 2022.  She recently had symptoms of angina and an abnormal Myoview.  Cardiac catheterization demonstrated 90% stenosis in the RCA prior to previous stent as well as 95% in-stent restenosis.  She was treated with DES to the mid RCA and angioplasty with shockwave lithotripsy to the in-stent restenosis.  She has done well since her procedure without recurrent anginal symptoms.  Electrocardiogram appears stable.  She took aspirin for 1 week and is now on Plavix in addition to Xarelto.  I have recommended she proceed with cardiac rehab.  She is getting ready to travel and will not be home until late September. Continue Plavix 75 mg daily, Lipitor 40 mg daily, Toprol-XL 25 mg daily Proceed with cardiac rehab Follow-up 2 to 3 months

## 2022-03-20 NOTE — Assessment & Plan Note (Signed)
She is maintaining sinus rhythm.  Creatinine clearance 90.  Continue Xarelto 20 mg daily.

## 2022-03-20 NOTE — Assessment & Plan Note (Signed)
LDL optimal on most recent lab work.  Continue current Rx.   

## 2022-03-20 NOTE — Assessment & Plan Note (Signed)
Blood pressure well controlled.  Continue Toprol-XL 25 mg daily, Altace 2.5 mg daily.

## 2022-03-20 NOTE — Patient Instructions (Signed)
Medication Instructions:   Your physician recommends that you continue on your current medications as directed. Please refer to the Current Medication list given to you today.   *If you need a refill on your cardiac medications before your next appointment, please call your pharmacy*   Lab Work:  None ordered.  If you have labs (blood work) drawn today and your tests are completely normal, you will receive your results only by: MyChart Message (if you have MyChart) OR A paper copy in the mail If you have any lab test that is abnormal or we need to change your treatment, we will call you to review the results.   Testing/Procedures:  None ordered.   Follow-Up: At [facility], you and your health needs are our priority.  As part of our continuing mission to provide you with exceptional heart care, we have created designated Provider Care Teams.  These Care Teams include your primary Cardiologist (physician) and Advanced Practice Providers (APPs -  Physician Assistants and Nurse Practitioners) who all work together to provide you with the care you need, when you need it.  We recommend signing up for the patient portal called "MyChart".  Sign up information is provided on this After Visit Summary.  MyChart is used to connect with patients for Virtual Visits (Telemedicine).  Patients are able to view lab/test results, encounter notes, upcoming appointments, etc.  Non-urgent messages can be sent to your provider as well.   To learn more about what you can do with MyChart, go to [email or web address].    Your next appointment:   2 month(s)  The format for your next appointment:   In Person  Provider:   Jayadeep Varanasi, MD       Important Information About Sugar       

## 2022-03-21 ENCOUNTER — Telehealth: Payer: Self-pay | Admitting: Interventional Cardiology

## 2022-03-21 NOTE — Telephone Encounter (Signed)
Patient notified.  She will contact provider she saw today to request change in medication

## 2022-03-21 NOTE — Telephone Encounter (Signed)
Pt c/o medication issue:  1. Name of Medication: Paxlovid  Dexamethasone 6 MG  2. How are you currently taking this medication (dosage and times per day)? Not currently taking  3. Are you having a reaction (difficulty breathing--STAT)? No   4. What is your medication issue? Patient is calling stating she woke up this morning sick and tested positive for Covid. She has been prescribed Paxlovid and Dexamethasone 6 MG. The pharmacist told her upon pick up to contact our office before taking Paxlovid due to it having a possible interference with her current medications. Please advise.

## 2022-03-21 NOTE — Telephone Encounter (Signed)
Patient should contact her provider for molunpivir if she wishes to take an antiviral. She should not take Paxlovid with her Xarelto. It also interacts with several of her other medications.

## 2022-03-28 ENCOUNTER — Encounter (HOSPITAL_COMMUNITY): Payer: Self-pay

## 2022-04-13 ENCOUNTER — Ambulatory Visit: Payer: 59 | Admitting: Interventional Cardiology

## 2022-04-18 ENCOUNTER — Telehealth (HOSPITAL_COMMUNITY): Payer: Self-pay

## 2022-04-18 NOTE — Telephone Encounter (Signed)
No response from pt regarding CR.  Closed referral.  

## 2022-04-28 ENCOUNTER — Other Ambulatory Visit: Payer: Self-pay | Admitting: Interventional Cardiology

## 2022-04-28 DIAGNOSIS — I480 Paroxysmal atrial fibrillation: Secondary | ICD-10-CM

## 2022-04-29 ENCOUNTER — Other Ambulatory Visit: Payer: Self-pay | Admitting: Interventional Cardiology

## 2022-05-06 ENCOUNTER — Other Ambulatory Visit: Payer: Self-pay | Admitting: Interventional Cardiology

## 2022-05-21 ENCOUNTER — Other Ambulatory Visit: Payer: Self-pay | Admitting: Interventional Cardiology

## 2022-05-21 ENCOUNTER — Ambulatory Visit: Payer: 59 | Attending: Interventional Cardiology | Admitting: Interventional Cardiology

## 2022-05-21 ENCOUNTER — Encounter: Payer: Self-pay | Admitting: Interventional Cardiology

## 2022-05-21 VITALS — BP 136/78 | HR 75 | Ht 65.0 in | Wt 210.8 lb

## 2022-05-21 DIAGNOSIS — Z955 Presence of coronary angioplasty implant and graft: Secondary | ICD-10-CM | POA: Diagnosis not present

## 2022-05-21 DIAGNOSIS — E119 Type 2 diabetes mellitus without complications: Secondary | ICD-10-CM

## 2022-05-21 DIAGNOSIS — Z79899 Other long term (current) drug therapy: Secondary | ICD-10-CM | POA: Diagnosis not present

## 2022-05-21 DIAGNOSIS — I2510 Atherosclerotic heart disease of native coronary artery without angina pectoris: Secondary | ICD-10-CM

## 2022-05-21 DIAGNOSIS — E7800 Pure hypercholesterolemia, unspecified: Secondary | ICD-10-CM

## 2022-05-21 DIAGNOSIS — R7989 Other specified abnormal findings of blood chemistry: Secondary | ICD-10-CM

## 2022-05-21 DIAGNOSIS — R790 Abnormal level of blood mineral: Secondary | ICD-10-CM

## 2022-05-21 DIAGNOSIS — I10 Essential (primary) hypertension: Secondary | ICD-10-CM

## 2022-05-21 NOTE — Patient Instructions (Addendum)
Medication Instructions:  Your physician recommends that you continue on your current medications as directed. Please refer to the Current Medication list given to you today.  *If you need a refill on your cardiac medications before your next appointment, please call your pharmacy*   Lab Work: Lab work to be done today--CMET, Magnesium, Vitamin D level, A1c If you have labs (blood work) drawn today and your tests are completely normal, you will receive your results only by: [place] (if you have MyChart) OR A paper copy in the mail If you have any lab test that is abnormal or we need to change your treatment, we will call you to review the results.   Testing/Procedures: none   Follow-Up: At [facility], you and your health needs are our priority.  As part of our continuing mission to provide you with exceptional heart care, we have created designated Provider Care Teams.  These Care Teams include your primary Cardiologist (physician) and Advanced Practice Providers (APPs -  Physician Assistants and Nurse Practitioners) who all work together to provide you with the care you need, when you need it.  We recommend signing up for the patient portal called "MyChart".  Sign up information is provided on this After Visit Summary.  MyChart is used to connect with patients for Virtual Visits (Telemedicine).  Patients are able to view lab/test results, encounter notes, upcoming appointments, etc.  Non-urgent messages can be sent to your provider as well.   To learn more about what you can do with MyChart, go to [email or web address].    Your next appointment:   9 month(s)  The format for your next appointment:   In Person  Provider:   Larae Grooms, MD     Other Instructions    Important Information About Sugar

## 2022-05-21 NOTE — Progress Notes (Signed)
Cardiology Office Note   Date:  05/21/2022   ID:  Courtney, Olson 1966-01-20, MRN [id number]  PCP:  Orpah Melter, MD    No chief complaint on file.  CAD  Wt Readings from Last 3 Encounters:  05/21/22 210 lb 12.8 oz (95.6 kg)  03/20/22 209 lb (94.8 kg)  02/27/22 209 lb (94.8 kg)       History of Present Illness: Courtney Olson is a 56 y.o. female  with CAD.  Several interventions have been performed on her RCA.   "Coronary artery disease  S/p DES to RCA in 05/2019 S/p DES to RCA in 06/2021 due to ISR  S/p 3.5 x 12 mm Synergy DES to [facility] and shockwave lithotripsy to mRCA ISR in 02/2022 Paroxysmal atrial fibrillation  Rx w Rivaroxaban  Monitor 07/2021: no AFib Aortic atherosclerosis  Hypertension  Diabetes mellitus, insulin dependent  Hyperlipidemia  OSA Bradycardia Hx of vasovagal syncope  Hx of pancreatitis  S/p Renal mass excision (benign) Ulcerative colitis  Hx of CMV hepatitis  Rheumatoid arthritis    Prior CV Studies: LEFT HEART CATH 03/02/2022 D1 60, D2 60 LCx ostial 25 RCA proximal 25, mid 90, mid stent 95 ISR, distal 25 PCI: 3.5 x 12 mm Synergy DES to the mid RCA PCI: Balloon angioplasty followed by coronary lithotripsy to mid RCA stent   Successful PCI of the mid RCA with intravascular lithotripsy.  Stent placed into the more proximal lesion across the proximal edge of prior stent.  The in-stent restenosis in the prior overlap area was treated with shockwave balloon and [place] balloon."  LONG TERM MONITOR (8-14 DAYS) INTERPRETATION 07/24/2021 Normal sinus rhythm with rare PACs and PVCs. // Rare, brief runs of PACs. // Patient symptoms correlated to PVCs. // No atrial fibrillation.  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   Has more GI fullness.  Is off of pantprazole. Gets full easier.   Denies : Chest pain. Dizziness.   Orthopnea. Palpitations. Paroxysmal  nocturnal dyspnea. Shortness of breath. Syncope.    One episode of chest pain when she had 4 alcoholic beverages on a cruise.  Also noted leg edema, when on long car trips.    Past Medical History:  Diagnosis Date   Aortic calcification (HCC)    Atrial tachycardia    Atrophic vaginitis 01/2006   Bradycardia 05/2014   CAD (coronary artery disease)    cardiac cath 05/21/2019 showing 805 mRCA, 60% D1 and D2 that were very small and patent LAD and LCx.  She underwent DES to the [facility] with residual 25% stenosis in the dRCA   Chronic pancolonic ulcerative colitis ([place]) 2015   unknown if chronic.   CKD (chronic kidney disease), stage II    Cytomegaloviral hepatitis ([place]) 2005   Diabetes mellitus without complication ([place]) 79/0240   on Ace inhibitor to protect kidneys   Gallstones 01/2013   GERD (gastroesophageal reflux disease)    Hyperlipidemia    Hypertension    Kidney tumor 03/2013   Right   NSVT (nonsustained ventricular tachycardia) (HCC)    Obesity    OSA (obstructive sleep apnea)    PAF (paroxysmal atrial fibrillation) ([place])    Pancreatitis 2014   Postmenopausal    Premature atrial contractions    PVC's (premature ventricular contractions)    RA (rheumatoid arthritis) ([place])    Syncope and collapse    Ulcerative colitis ([place]) 2015  Past Surgical History:  Procedure Laterality Date   CESAREAN SECTION     CHOLECYSTECTOMY  01/2013   COLONOSCOPY  2015   Colitis   CORONARY LITHOTRIPSY N/A 03/02/2022   Procedure: CORONARY LITHOTRIPSY;  Surgeon: Jettie Booze, MD;  Location: [place] [facility];  Service: Cardiovascular;  Laterality: N/A;   CORONARY STENT INTERVENTION N/A 05/21/2019   Procedure: CORONARY STENT INTERVENTION;  Surgeon: Jettie Booze, MD;  Location: [place] [facility];  Service: Cardiovascular;  Laterality: N/A;   CORONARY STENT INTERVENTION Right 06/27/2021   Procedure: CORONARY STENT INTERVENTION;  Surgeon: Jettie Booze, MD;  Location: [place] [facility];  Service: Cardiovascular;  Laterality: Right;   CORONARY STENT INTERVENTION N/A 03/02/2022   Procedure: CORONARY STENT INTERVENTION;  Surgeon: Jettie Booze, MD;  Location: [place] [facility];  Service: Cardiovascular;  Laterality: N/A;   DILATION AND CURETTAGE OF UTERUS  2002   HERNIA REPAIR  01/2013   INTRAVASCULAR ULTRASOUND/IVUS N/A 03/02/2022   Procedure: Intravascular Ultrasound/IVUS;  Surgeon: Jettie Booze, MD;  Location: [place] [facility];  Service: Cardiovascular;  Laterality: N/A;   Kidney and Bladder Stent and tube placed Right 03/2013   LEFT HEART CATH AND CORONARY ANGIOGRAPHY N/A 06/27/2021   Procedure: LEFT HEART CATH AND CORONARY ANGIOGRAPHY;  Surgeon: Jettie Booze, MD;  Location: [place] [facility];  Service: Cardiovascular;  Laterality: N/A;   LEFT HEART CATH AND CORONARY ANGIOGRAPHY N/A 03/02/2022   Procedure: LEFT HEART CATH AND CORONARY ANGIOGRAPHY;  Surgeon: Jettie Booze, MD;  Location: [place] [facility];  Service: Cardiovascular;  Laterality: N/A;   PARTIAL NEPHRECTOMY Right 02/2013   Tumor Removal    Procedure to remove kidney and bladder stent and tube Right 04/2013   RIGHT/LEFT HEART CATH AND CORONARY ANGIOGRAPHY N/A 05/21/2019   Procedure: RIGHT/LEFT HEART CATH AND CORONARY ANGIOGRAPHY;  Surgeon: Jettie Booze, MD;  Location: [place] [facility];  Service: Cardiovascular;  Laterality: N/A;   TOTAL ABDOMINAL HYSTERECTOMY W/ BILATERAL SALPINGOOPHORECTOMY  2002   Secondary to AUB     Current Outpatient Medications  Medication Sig Dispense Refill   ALPRAZolam (XANAX) 0.25 MG tablet Take 1 tablet (0.25 mg total) by mouth 2 (two) times daily as needed. (Patient taking differently: Take 0.25 mg by mouth 2 (two) times daily as needed for anxiety (flying).) 15 tablet 0   atorvastatin (LIPITOR) 40 MG tablet TAKE 1 TABLET BY MOUTH  DAILY 90 tablet 3   Cholecalciferol (VITAMIN D3 PO) Take 1 capsule by mouth daily.      clopidogrel (PLAVIX) 75 MG tablet TAKE 1 TABLET(75 MG) BY MOUTH DAILY WITH BREAKFAST 90 tablet 3   EPINEPHrine 0.3 mg/0.3 mL IJ SOAJ injection Inject 0.3 mLs into the skin as needed.  2   ezetimibe (ZETIA) 10 MG tablet TAKE 1 TABLET(10 MG) BY MOUTH DAILY 90 tablet 3   Fluocinolone Acetonide 0.01 % OIL Place in ear(s).     fluocinonide (LIDEX) 0.05 % external solution Apply topically.     furosemide (LASIX) 20 MG tablet Take 20 mg by mouth as needed for fluid or edema.     insulin glargine, 2 Unit Dial, (TOUJEO MAX SOLOSTAR) 300 UNIT/ML Solostar Pen Inject 18 Units into the skin 2 (two) times daily.     insulin lispro (HUMALOG) 100 UNIT/ML injection Inject 18 Units into the skin 2 (two) times daily.     magnesium oxide (MAG-OX) 400 MG tablet Take 1 tablet (400 mg total) by mouth  in the morning, at noon, and at bedtime. 90 tablet 3   mesalamine (LIALDA) 1.2 g EC tablet Take 1.2 g by mouth daily.      metoprolol succinate (TOPROL-XL) 25 MG 24 hr tablet TAKE 1 TABLET(25 MG) BY MOUTH DAILY 90 tablet 3   Multiple Vitamin (MULTIVITAMIN) tablet Take 1 tablet by mouth daily. Pack Heart healthy     nitroGLYCERIN (NITROSTAT) 0.4 MG SL tablet Place 1 tablet (0.4 mg total) under the tongue every 5 (five) minutes as needed. 25 tablet 2   OZEMPIC, 0.25 OR 0.5 MG/DOSE, 2 MG/3ML SOPN Inject into the skin.     ramipril (ALTACE) 2.5 MG capsule TAKE 1 CAPSULE(2.5 MG) BY MOUTH DAILY 90 capsule 3   rivaroxaban (XARELTO) 20 MG TABS tablet Take 1 tablet (20 mg total) by mouth daily. 90 tablet 0   TRULICITY 4.5 GY/1.7CB SOPN Inject into the skin once a week. (Patient not taking: Reported on 05/21/2022)     No current facility-administered medications for this visit.    Allergies:   Orange (diagnostic), Orange fruit [citrus], Empagliflozin, Metformin hcl, Nitrofurantoin, Orange oil, Bactrim [sulfamethoxazole-trimethoprim], and Other    Social History:  The patient  reports that she has never smoked. She has never used  smokeless tobacco. She reports current alcohol use. She reports that she does not use drugs.   Family History:  The patient's family history includes Breast cancer (age of onset: 39) in her sister; Cancer in her maternal grandmother and paternal aunt; Diabetes in her father, maternal grandmother, mother, and paternal aunt; Fibromyalgia in her mother; Heart disease in her father; Hypertension in her father, mother, and sister.    ROS:  Please see the history of present illness.   Otherwise, review of systems are positive for occasional leg edema when on long trips.   All other systems are reviewed and negative.    PHYSICAL EXAM: VS:  BP 136/78   Pulse 75   Ht '5\' 5"'$  (1.651 m)   Wt 210 lb 12.8 oz (95.6 kg)   LMP 07/16/2000 (Approximate)   SpO2 98%   BMI 35.08 kg/m  , BMI Body mass index is 35.08 kg/m. GEN: Well nourished, well developed, in no acute distress HEENT: normal Neck: no JVD, carotid bruits, or masses Cardiac: RRR; no murmurs, rubs, or gallops,no edema  Respiratory:  clear to auscultation bilaterally, normal work of breathing GI: soft, nontender, nondistended, + BS MS: no deformity or atrophy Skin: warm and dry, no rash Neuro:  Strength and sensation are intact Psych: euthymic mood, full affect   EKG:   The ekg ordered today demonstrates NSR, no ST changes   Recent Labs: 06/20/2021: TSH 2.460 02/23/2022: ALT 45; BUN 14; Creatinine, Ser 1.05; Hemoglobin 12.9; Magnesium 1.9; NT-Pro BNP 348; Platelets 230; Potassium 4.4; Sodium 140   Lipid Panel    Component Value Date/Time   CHOL 133 02/23/2022 1232   TRIG 104 02/23/2022 1232   HDL 68 02/23/2022 1232   CHOLHDL 2.0 02/23/2022 1232   CHOLHDL 4 05/17/2014 1113   VLDL 34.4 05/17/2014 1113   LDLCALC 46 02/23/2022 1232     Other studies Reviewed: Additional studies/ records that were reviewed today with results demonstrating: labs reviewed.   ASSESSMENT AND PLAN:  CAD: No angina when she sticks to her usual  routine.  Minimize alcohol to 1 drink /day.  Whole food plant-based diet, high fiber diet. Avoiding processed foods.   Hyperlipidemia: LDL 46 in 8/23. HTN: The current medical regimen is effective;  continue present plan and medications. PAF: In NSR.  No bleeding problems. Anticoagulated: No bleeding issues.  GI: She reports not being able to finish a hamburger.  I counseled her on healthy cardiac diet.  In addition, her caloric requirements are probably decreasing with age so she should listen to her body if she feels full.  She will also follow-up with Dr. Michail Sermon.  The question of endoscopy was raised.  Ideally, she would wait at least 3-6 months post PCI prior to any endoscopy.  Last PCI was done in August.  Depending on the urgency of endoscopy, could discuss when to hold antiplatelet therapy. Diabetes: A1c 7.6 in August 2023.  We will recheck today.   Current medicines are reviewed at length with the patient today.  The patient concerns regarding her medicines were addressed.  The following changes have been made:  No change  Labs/ tests ordered today include:  No orders of the defined types were placed in this encounter.   Recommend 150 minutes/week of aerobic exercise Low fat, low carb, high fiber diet recommended  Disposition:   FU in 1 year   Signed, Larae Grooms, MD  05/21/2022 10:18 AM    [address], [place], [place]  [address] Phone: [phone]; Fax: [phone]

## 2022-05-22 LAB — COMPREHENSIVE METABOLIC PANEL
ALT: 24 IU/L (ref 0–32)
AST: 27 IU/L (ref 0–40)
Albumin/Globulin Ratio: 2.3 — ABNORMAL HIGH (ref 1.2–2.2)
Albumin: 4.6 g/dL (ref 3.8–4.9)
Alkaline Phosphatase: 91 IU/L (ref 44–121)
BUN/Creatinine Ratio: 14 (ref 9–23)
BUN: 13 mg/dL (ref 6–24)
Bilirubin Total: 0.8 mg/dL (ref 0.0–1.2)
CO2: 27 mmol/L (ref 20–29)
Calcium: 9.9 mg/dL (ref 8.7–10.2)
Chloride: 100 mmol/L (ref 96–106)
Creatinine, Ser: 0.91 mg/dL (ref 0.57–1.00)
Globulin, Total: 2 g/dL (ref 1.5–4.5)
Glucose: 77 mg/dL (ref 70–99)
Potassium: 4 mmol/L (ref 3.5–5.2)
Sodium: 140 mmol/L (ref 134–144)
Total Protein: 6.6 g/dL (ref 6.0–8.5)
eGFR: 74 mL/min/{1.73_m2} (ref >59)

## 2022-05-22 LAB — HEMOGLOBIN A1C
Est. average glucose Bld gHb Est-mCnc: 177 mg/dL
Hgb A1c MFr Bld: 7.8 % — ABNORMAL HIGH (ref 4.8–5.6)

## 2022-05-22 LAB — VITAMIN D 25 HYDROXY (VIT D DEFICIENCY, FRACTURES): Vit D, 25-Hydroxy: 37.6 ng/mL (ref 30.0–100.0)

## 2022-05-22 LAB — MAGNESIUM: Magnesium: 1.8 mg/dL (ref 1.6–2.3)

## 2022-05-23 NOTE — Addendum Note (Signed)
Addended by: Janan Halter F on: 05/23/2022 11:37 AM   Modules accepted: Orders

## 2022-05-28 ENCOUNTER — Other Ambulatory Visit: Payer: Self-pay | Admitting: Obstetrics and Gynecology

## 2022-05-28 DIAGNOSIS — Z1231 Encounter for screening mammogram for malignant neoplasm of breast: Secondary | ICD-10-CM

## 2022-05-30 ENCOUNTER — Encounter: Payer: Self-pay | Admitting: Nurse Practitioner

## 2022-05-30 ENCOUNTER — Ambulatory Visit (INDEPENDENT_AMBULATORY_CARE_PROVIDER_SITE_OTHER): Payer: 59 | Admitting: Nurse Practitioner

## 2022-05-30 VITALS — BP 122/80 | HR 76 | Resp 18 | Ht 64.37 in | Wt 208.2 lb

## 2022-05-30 DIAGNOSIS — N941 Unspecified dyspareunia: Secondary | ICD-10-CM | POA: Diagnosis not present

## 2022-05-30 DIAGNOSIS — Z780 Asymptomatic menopausal state: Secondary | ICD-10-CM

## 2022-05-30 DIAGNOSIS — N952 Postmenopausal atrophic vaginitis: Secondary | ICD-10-CM

## 2022-05-30 DIAGNOSIS — Z01419 Encounter for gynecological examination (general) (routine) without abnormal findings: Secondary | ICD-10-CM | POA: Diagnosis not present

## 2022-05-30 MED ORDER — ESTRADIOL 0.1 MG/GM VA CREA: 1.0000 g | TOPICAL_CREAM | VAGINAL | 3 refills | Status: DC

## 2022-05-30 NOTE — Progress Notes (Signed)
Courtney Olson 1966-01-20 [id number]   History:  56 y.o. G3P3003 presents for annual exam. Postmenopausal. S/P 2002 TAH BSO for AUB. Stopped ERT in 2014. Complains of pain with intercourse. Used vaginal estrogen in the past and would like to restart. H/O CAD, CKD, DM, RA, UC.   Gynecologic History Patient's last menstrual period was 07/16/2000 (approximate).   Contraception/Family planning: status post hysterectomy Sexually active: Yes  Health Maintenance Last Pap: 07/12/2014. Results were: Normal Last mammogram: 12/03/2020. Results were: Normal Last colonoscopy: 4-5 years ago. 1-year recall Last Dexa: 01/22/2020. Results were: Normal  Past medical history, past surgical history, family history and social history were all reviewed and documented in the EPIC chart. Married. 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 44 yo daughter, nurse for [employer], has 29 and 9 yo children. 42 yo daughter, just got married last weekend, [redacted] weeks pregnant, Pharmacist, hospital at [employer]. Son graduating from college in May with marketing degree.   ROS:  A ROS was performed and pertinent positives and negatives are included.  Exam:  Vitals:   05/30/22 1130  BP: 122/80  Pulse: 76  Resp: 18  SpO2: 98%  Weight: 208 lb 3.2 oz (94.4 kg)  Height: 5' 4.37" (1.635 m)   Body mass index is 35.33 kg/m.  General appearance:  Normal Thyroid:  Symmetrical, normal in size, without palpable masses or nodularity. Respiratory  Auscultation:  Clear without wheezing or rhonchi Cardiovascular  Auscultation:  Regular rate, without rubs, murmurs or gallops  Edema/varicosities:  Not grossly evident Abdominal  Soft,nontender, without masses, guarding or rebound.  Liver/spleen:  No organomegaly noted  Hernia:  None appreciated  Skin  Inspection:  Grossly normal Breasts: Examined lying and sitting.   Right: Without masses, retractions, nipple discharge or axillary adenopathy.   Left: Without masses, retractions, nipple  discharge or axillary adenopathy. Genitourinary   Inguinal/mons:  Normal without inguinal adenopathy  External genitalia:  Normal appearing vulva with no masses, tenderness, or lesions  BUS/Urethra/Skene's glands:  Normal  Vagina:  Normal appearing with normal color and discharge, no lesions  Cervix:  And uterus absent  Adnexa/parametria:     Rt: Normal in size, without masses or tenderness.   Lt: Normal in size, without masses or tenderness.  Anus and perineum: Normal  Digital rectal exam: Normal sphincter tone without palpated masses or tenderness  Patient informed chaperone available to be present for breast and pelvic exam. Patient has requested no chaperone to be present. Patient has been advised what will be completed during breast and pelvic exam.   Assessment/Plan:  56 y.o. G3P3003 for annual exam.   Well female exam with routine gynecological exam - Education provided on SBEs, importance of preventative screenings, current guidelines, high calcium diet, regular exercise, and multivitamin daily.  Labs with PCP.   Postmenopausal - no HRT  Vaginal atrophy - Plan: estradiol (ESTRACE VAGINAL) 0.1 MG/GM vaginal cream twice weekly. Will use nightly x 2 weeks initially then twice weekly.   Dyspareunia in female - Plan: estradiol (ESTRACE VAGINAL) 0.1 MG/GM vaginal cream  Screening for cervical cancer - Normal Pap history.  No longer screening per guidelines.   Screening for breast cancer - Normal mammogram history.  Continue annual screenings.Overdue but scheduled 06/27/2022.  Normal breast exam today.  Screening for colon cancer - Colonoscopy 4 years ago. Will repeat at GI's recommended interval.   Screening for osteoporosis - Normal bone density in 2021. Will repeat at 5-year interval.   Return in 1 year for annual.  Tamela Gammon DNP, 12:14 PM 05/30/2022

## 2022-06-27 ENCOUNTER — Ambulatory Visit
Admission: RE | Admit: 2022-06-27 | Discharge: 2022-06-27 | Disposition: A | Discharge status: Home / Self Care | Payer: 59 | Source: Ambulatory Visit | Attending: Obstetrics and Gynecology | Admitting: Obstetrics and Gynecology

## 2022-06-27 DIAGNOSIS — Z1231 Encounter for screening mammogram for malignant neoplasm of breast: Secondary | ICD-10-CM

## 2022-07-02 ENCOUNTER — Ambulatory Visit
Admission: RE | Admit: 2022-07-02 | Discharge: 2022-07-02 | Disposition: A | Discharge status: Home / Self Care | Payer: 59 | Source: Ambulatory Visit | Attending: Obstetrics and Gynecology | Admitting: Obstetrics and Gynecology

## 2022-07-03 ENCOUNTER — Ambulatory Visit: Payer: 59

## 2022-08-13 ENCOUNTER — Ambulatory Visit
Admission: EM | Admit: 2022-08-13 | Discharge: 2022-08-13 | Disposition: A | Discharge status: Home / Self Care | Payer: 59 | Attending: Family Medicine | Admitting: Family Medicine

## 2022-08-13 ENCOUNTER — Telehealth: Payer: Self-pay | Admitting: Interventional Cardiology

## 2022-08-13 ENCOUNTER — Encounter: Payer: Self-pay | Admitting: Emergency Medicine

## 2022-08-13 ENCOUNTER — Ambulatory Visit (INDEPENDENT_AMBULATORY_CARE_PROVIDER_SITE_OTHER): Payer: 59

## 2022-08-13 DIAGNOSIS — R0989 Other specified symptoms and signs involving the circulatory and respiratory systems: Secondary | ICD-10-CM | POA: Diagnosis not present

## 2022-08-13 DIAGNOSIS — R079 Chest pain, unspecified: Secondary | ICD-10-CM | POA: Diagnosis not present

## 2022-08-13 DIAGNOSIS — R059 Cough, unspecified: Secondary | ICD-10-CM | POA: Diagnosis not present

## 2022-08-13 DIAGNOSIS — R0789 Other chest pain: Secondary | ICD-10-CM | POA: Diagnosis not present

## 2022-08-13 DIAGNOSIS — R051 Acute cough: Secondary | ICD-10-CM | POA: Diagnosis not present

## 2022-08-13 MED ORDER — AZITHROMYCIN 250 MG PO TABS: ORAL_TABLET | ORAL | 0 refills | Status: DC

## 2022-08-13 NOTE — Telephone Encounter (Signed)
Received call in [facility] Triage, called Pt back.  Pt states she was at her eye exam today, and her Eye Doctor saw something abnormal in her eye during the examination.   Pt states that the left side of her neck feels tight sometimes when her blood pressure is elevated.  Her eye provider palpated her neck, and stated she should contact her cardiologist.  The provider thinks this patient needs a carotid artery scan, specifically the left side?  ( Pt also states eye care provider is supposed to fax over concern / request for further evaluation? )  Pt states that her husband lost his job, and their insurance expires 08/15/2022.  Pt wanted to know if it was possible to have this checked prior to the insurance ending?    Pt told I will message Dr. Irish Lack and RN an Juluis Rainier message regarding the concern and request.   Follow up required.

## 2022-08-13 NOTE — ED Triage Notes (Signed)
Dry cough at night since 07/29/22 Sore throat from coughing CBG has been elevated after being on prednisone  Pt's eyes are dilated from being at eye doctor  Eye doctor told her she needed a carotid US on left

## 2022-08-13 NOTE — Discharge Instructions (Addendum)
Make sure you are drinking lots of fluids Take Z-Pak as directed Continue Tessalon Perles if needed Return tomorrow for your carotid ultrasound Call in am for appointment time Follow-up with your primary care/cardiologist

## 2022-08-13 NOTE — Telephone Encounter (Signed)
OK to order carotid doppler for PAD.

## 2022-08-13 NOTE — Telephone Encounter (Signed)
Marzetta Board Courtney Olson from [facility] surgeons calling about this matter, she was unaware pt called earlier as well. She would like to speak to RN when possible, did inform her that message was routed to Dr. Irish Lack and RN as well.

## 2022-08-13 NOTE — ED Provider Notes (Signed)
[name]    CSN: [id number] Arrival date & time: 08/13/22  1455      History   Chief Complaint Chief Complaint  Patient presents with   Cough    HPI Courtney Olson is a 56 y.o. female.   HPI  Patient very pleasant 57 year old woman.  She is a vasculopath.  She states it runs in her family.  She has a history of coronary artery disease and aortic calcifications.  Has also had a history of heart failure, hyperlipidemia, hypertension, diabetes, chronic kidney disease, paroxysmal atrial fib.  She is here for cough.  It has been bothering her for the last 10 or 11 days.  It started after she and her husband went on a cruise to [place]. She had a virtual visit and they gave her prednisone.  She had to stop this because her blood sugar went up over 500 She went to her ophthalmologist today.  They did a dilated eye exam.  He states that there are multiple hemorrhages in her left eye and he suspects that she has calcification/plaque in her left carotid that is clots.  He recommended that she get a carotid artery ultrasound ASAP She has called her cardiologist and called her primary care doctor with this information but has not yet heard back from them.  She has significant anxiety about this diagnosis  Past Medical History:  Diagnosis Date   Aortic calcification (HCC)    Atrial tachycardia    Atrophic vaginitis 01/2006   Bradycardia 05/2014   CAD (coronary artery disease)    cardiac cath 05/21/2019 showing 805 mRCA, 60% D1 and D2 that were very small and patent LAD and LCx.  She underwent DES to the [facility] with residual 25% stenosis in the dRCA   Chronic pancolonic ulcerative colitis ([place]) 2015   unknown if chronic.   CKD (chronic kidney disease), stage II    Cytomegaloviral hepatitis ([place]) 2005   Diabetes mellitus without complication ([place]) 96/7591   on Ace inhibitor to protect kidneys   Gallstones 01/2013   GERD (gastroesophageal reflux disease)    Heart failure ([place])  10/26/2020   Hyperlipidemia    Hypertension    Kidney tumor 03/2013   Right   NSVT (nonsustained ventricular tachycardia) (HCC)    Obesity    OSA (obstructive sleep apnea)    PAF (paroxysmal atrial fibrillation) ([place])    Pancreatitis 2014   Postmenopausal    Premature atrial contractions    PVC's (premature ventricular contractions)    RA (rheumatoid arthritis) ([place])    Syncope and collapse    Ulcerative colitis ([place]) 2015    Patient Active Problem List   Diagnosis Date Noted   CAD (coronary artery disease) 03/02/2022   Anxiety about health 10/26/2020   Chronic ulcerative pancolitis ([place]) 10/26/2020   Claudication ([place]) 10/26/2020   Diabetic renal disease ([place]) 10/26/2020   Diarrhea 10/26/2020   Diverticulitis 10/26/2020   Ulcerative colitis ([place]) 10/26/2020   Heart failure ([place]) 10/26/2020   History of pancreatitis 10/26/2020   Obesity 10/26/2020   Polyneuropathy due to type 2 diabetes mellitus ([place]) 10/26/2020   Surgical menopause 10/26/2020   Combined forms of age-related cataract of right eye 08/04/2020   Combined forms of age-related cataract of left eye 05/05/2020   Trigger thumb of right hand 04/19/2020   Coronary artery disease involving coronary bypass graft of native heart with angina pectoris ([place])    Exertional dyspnea    Aortic calcification ([place]) 06/14/2014   Renal  mass 06/14/2014   PAF (paroxysmal atrial fibrillation) (HCC)    Precordial chest pain 04/01/2014   Vasovagal syncope 04/01/2014   Hyperlipidemia    Hypertension    OSA (obstructive sleep apnea)    RA (rheumatoid arthritis) ([place])    GERD (gastroesophageal reflux disease)    Cytomegaloviral hepatitis ([place])    S/p nephrectomy 04/13/2013   Atrophic vaginitis 01/13/2006   Diabetes mellitus without complication ([place]) 31/51/7616    Past Surgical History:  Procedure Laterality Date   CESAREAN SECTION     CHOLECYSTECTOMY  01/2013   COLONOSCOPY  2015   Colitis   CORONARY LITHOTRIPSY N/A  03/02/2022   Procedure: CORONARY LITHOTRIPSY;  Surgeon: Jettie Booze, MD;  Location: [facility];  Service: Cardiovascular;  Laterality: N/A;   CORONARY STENT INTERVENTION N/A 05/21/2019   Procedure: CORONARY STENT INTERVENTION;  Surgeon: Jettie Booze, MD;  Location: [facility];  Service: Cardiovascular;  Laterality: N/A;   CORONARY STENT INTERVENTION Right 06/27/2021   Procedure: CORONARY STENT INTERVENTION;  Surgeon: Jettie Booze, MD;  Location: [facility];  Service: Cardiovascular;  Laterality: Right;   CORONARY STENT INTERVENTION N/A 03/02/2022   Procedure: CORONARY STENT INTERVENTION;  Surgeon: Jettie Booze, MD;  Location: [facility];  Service: Cardiovascular;  Laterality: N/A;   DILATION AND CURETTAGE OF UTERUS  2002   HERNIA REPAIR  01/2013   INTRAVASCULAR ULTRASOUND/IVUS N/A 03/02/2022   Procedure: Intravascular Ultrasound/IVUS;  Surgeon: Jettie Booze, MD;  Location: [facility];  Service: Cardiovascular;  Laterality: N/A;   Kidney and Bladder Stent and tube placed Right 03/2013   LEFT HEART CATH AND CORONARY ANGIOGRAPHY N/A 06/27/2021   Procedure: LEFT HEART CATH AND CORONARY ANGIOGRAPHY;  Surgeon: Jettie Booze, MD;  Location: [facility];  Service: Cardiovascular;  Laterality: N/A;   LEFT HEART CATH AND CORONARY ANGIOGRAPHY N/A 03/02/2022   Procedure: LEFT HEART CATH AND CORONARY ANGIOGRAPHY;  Surgeon: Jettie Booze, MD;  Location: [facility];  Service: Cardiovascular;  Laterality: N/A;   PARTIAL NEPHRECTOMY Right 02/2013   Tumor Removal    Procedure to remove kidney and bladder stent and tube Right 04/2013   RIGHT/LEFT HEART CATH AND CORONARY ANGIOGRAPHY N/A 05/21/2019   Procedure: RIGHT/LEFT HEART CATH AND CORONARY ANGIOGRAPHY;  Surgeon: Jettie Booze, MD;  Location: [facility];  Service: Cardiovascular;  Laterality: N/A;   TOTAL ABDOMINAL HYSTERECTOMY W/ BILATERAL  SALPINGOOPHORECTOMY  2002   Secondary to AUB    OB History     Gravida  3   Para  3   Term  3   Preterm  0   AB  0   Living  3      SAB  0   IAB  0   Ectopic  0   Multiple  0   Live Births  3            Home Medications    Prior to Admission medications   Medication Sig Start Date End Date Taking? Authorizing Provider  azithromycin (ZITHROMAX Z-PAK) 250 MG tablet Take two pills today followed by one a day until gone 08/13/22  Yes Raylene Everts, MD  isosorbide mononitrate (IMDUR) 30 MG 24 hr tablet Take 15 mg by mouth daily. 05/30/22  Yes [provider]  ALPRAZolam (XANAX) 0.25 MG tablet Take 1 tablet (0.25 mg total) by mouth 2 (two) times daily as needed. Patient taking differently: Take 0.25  mg by mouth 2 (two) times daily as needed for anxiety (flying). 10/26/20   Salvadore Dom, MD  atorvastatin (LIPITOR) 40 MG tablet TAKE 1 TABLET BY MOUTH  DAILY 08/21/21   Jettie Booze, MD  Cholecalciferol (VITAMIN D3 PO) Take 1 capsule by mouth daily.    [provider]  clopidogrel (PLAVIX) 75 MG tablet TAKE 1 TABLET(75 MG) BY MOUTH DAILY WITH BREAKFAST 05/21/22   Jettie Booze, MD  EPINEPHrine 0.3 mg/0.3 mL IJ SOAJ injection Inject 0.3 mLs into the skin as needed. 12/16/16   [provider]  estradiol (ESTRACE VAGINAL) 0.1 MG/GM vaginal cream Place 1 g vaginally 2 (two) times a week. Initial dose: nightly x 2 weeks, then twice weekly 05/31/22   Marny Lowenstein A, NP  ezetimibe (ZETIA) 10 MG tablet TAKE 1 TABLET(10 MG) BY MOUTH DAILY 04/30/22   Jettie Booze, MD  Fluocinolone Acetonide 0.01 % OIL Place in ear(s). 04/28/22   [provider]  fluocinonide (LIDEX) 0.05 % external solution Apply topically. 04/30/22   [provider]  furosemide (LASIX) 20 MG tablet Take 20 mg by mouth as needed for fluid or edema.    [provider]  insulin glargine, 2 Unit Dial, (TOUJEO MAX SOLOSTAR) 300 UNIT/ML  Solostar Pen Inject 18 Units into the skin 2 (two) times daily.    [provider]  insulin lispro (HUMALOG) 100 UNIT/ML injection Inject 18 Units into the skin 2 (two) times daily.    [provider]  magnesium oxide (MAG-OX) 400 MG tablet Take 1 tablet (400 mg total) by mouth in the morning, at noon, and at bedtime. 02/26/22   Richardson Dopp T, PA-C  mesalamine (LIALDA) 1.2 g EC tablet Take 1.2 g by mouth daily.     [provider]  metoprolol succinate (TOPROL-XL) 25 MG 24 hr tablet TAKE 1 TABLET(25 MG) BY MOUTH DAILY 04/30/22   Jettie Booze, MD  Multiple Vitamin (MULTIVITAMIN) tablet Take 1 tablet by mouth daily. Pack Heart healthy    [provider]  nitroGLYCERIN (NITROSTAT) 0.4 MG SL tablet Place 1 tablet (0.4 mg total) under the tongue every 5 (five) minutes as needed. 06/27/21   Reino Bellis B, NP  ramipril (ALTACE) 2.5 MG capsule TAKE 1 CAPSULE(2.5 MG) BY MOUTH DAILY 05/08/22   Jettie Booze, MD  rivaroxaban (XARELTO) 20 MG TABS tablet Take 1 tablet (20 mg total) by mouth daily. 08/31/21   Jettie Booze, MD  TRULICITY 4.5 PV/3.7SM SOPN Inject into the skin once a week. 03/01/22   [provider]    Family History Family History  Problem Relation Age of Onset   Diabetes Mother    Hypertension Mother    Fibromyalgia Mother    Hypertension Father    Heart disease Father        CABG   Diabetes Father    Hypertension Sister    Breast cancer Sister 64       negative genetic testing   Diabetes Paternal Aunt    Cancer Paternal Aunt        bone   Cancer Maternal Grandmother        ovarian   Diabetes Maternal Grandmother     Social History Social History   Tobacco Use   Smoking status: Never   Smokeless tobacco: Never  Vaping Use   Vaping Use: Never used  Substance Use Topics   Alcohol use: Yes    Comment: 2 a month   Drug  use: No     Allergies   Orange (diagnostic), Orange fruit [citrus],  Empagliflozin, Metformin hcl, Nitrofurantoin, and Bactrim [sulfamethoxazole-trimethoprim]   Review of Systems Review of Systems See HPI  Physical Exam Triage Vital Signs ED Triage Vitals  Enc Vitals Group     BP 08/13/22 1524 (!) 150/87     Pulse Rate 08/13/22 1524 73     Resp 08/13/22 1524 16     Temp 08/13/22 1524 99.1 F (37.3 C)     Temp Source 08/13/22 1524 Oral     SpO2 08/13/22 1524 99 %     Weight 08/13/22 1529 203 lb (92.1 kg)     Height 08/13/22 1529 '5\' 5"'$  (1.651 m)     Head Circumference --      Peak Flow --      Pain Score --      Pain Loc --      Pain Edu? --      Excl. in [place]? --    No data found.  Updated Vital Signs BP (!) 150/87 (BP Location: Right Arm)   Pulse 73   Temp 99.1 F (37.3 C) (Oral)   Resp 16   Ht '5\' 5"'$  (1.651 m)   Wt 92.1 kg   LMP 07/16/2000 (Approximate)   SpO2 99%   BMI 33.78 kg/m       Physical Exam Constitutional:      General: She is not in acute distress.    Appearance: She is well-developed.     Comments: Pleasant.  Overweight.  Anxious about health  HENT:     Head: Normocephalic and atraumatic.     Right Ear: Tympanic membrane and ear canal normal.     Left Ear: Tympanic membrane and ear canal normal.     Nose: Nose normal. No rhinorrhea.     Mouth/Throat:     Pharynx: Posterior oropharyngeal erythema present.  Eyes:     Conjunctiva/sclera: Conjunctivae normal.     Pupils: Pupils are equal, round, and reactive to light.  Neck:     Vascular: Carotid bruit present.  Cardiovascular:     Rate and Rhythm: Normal rate and regular rhythm.     Heart sounds: Murmur heard.  Pulmonary:     Effort: Pulmonary effort is normal. No respiratory distress.     Breath sounds: Normal breath sounds.  Abdominal:     General: There is no distension.     Palpations: Abdomen is soft.  Musculoskeletal:        General: Normal range of motion.     Cervical back: Normal range of motion.  Skin:    General: Skin is warm and dry.   Neurological:     Mental Status: She is alert.      UC Treatments / Results  Labs (all labs ordered are listed, but only abnormal results are displayed) Labs Reviewed - No data to display  EKG   Radiology DG Chest 2 View  Result Date: 08/13/2022 CLINICAL DATA:  Cough and chest pain since July 29, 2022. EXAM: CHEST - 2 VIEW COMPARISON:  March 22, 2011 FINDINGS: The heart size and mediastinal contours are within normal limits. Both lungs are clear. The visualized skeletal structures are unremarkable. IMPRESSION: No active cardiopulmonary disease. Electronically Signed   By: Abelardo Diesel M.D.   On: 08/13/2022 17:00    Procedures Procedures (including critical care time)  Medications Ordered in UC Medications - No data to display  Initial Impression / Assessment and Plan /  UC Course  I have reviewed the triage vital signs and the nursing notes.  Pertinent labs & imaging results that were available during my care of the patient were reviewed by me and considered in my medical decision making (see chart for details).     Patient has an upper respiratory infection with cough.  Will treat her with a Z-Pak.  Increase fluids.  She already has Tessalon for the cough.  Patient also has a carotid bruit and findings on ophthalmoscopic exam.  She is an insulin-dependent diabetic and a vasculopath.  Her ophthalmologist emphasized to her the need to get a carotid ultrasound and this has caused her fright.  I will order the ultrasound to go to her cardiologist and PCP for management. Final Clinical Impressions(s) / UC Diagnoses   Final diagnoses:  Sensation of chest pressure  Acute cough  Bruit of left carotid artery     Discharge Instructions      Make sure you are drinking lots of fluids Take Z-Pak as directed Continue Tessalon [place] if needed Return tomorrow for your carotid ultrasound Call in am for appointment time Follow-up with your primary  care/cardiologist     ED Prescriptions     Medication Sig Dispense Auth. Provider   azithromycin (ZITHROMAX Z-PAK) 250 MG tablet Take two pills today followed by one a day until gone 6 tablet Meda Coffee Jennette Banker, MD      PDMP not reviewed this encounter.   Raylene Everts, MD 08/13/22 [phone]

## 2022-08-13 NOTE — Telephone Encounter (Signed)
Patient calling in to speak with the nurse. She states that she needs a order to be put in for her. Please advise

## 2022-08-14 ENCOUNTER — Telehealth: Payer: Self-pay | Admitting: Family Medicine

## 2022-08-14 ENCOUNTER — Ambulatory Visit (INDEPENDENT_AMBULATORY_CARE_PROVIDER_SITE_OTHER): Payer: 59

## 2022-08-14 ENCOUNTER — Other Ambulatory Visit: Payer: Self-pay | Admitting: Family Medicine

## 2022-08-14 ENCOUNTER — Other Ambulatory Visit: Payer: 59

## 2022-08-14 DIAGNOSIS — H5789 Other specified disorders of eye and adnexa: Secondary | ICD-10-CM | POA: Diagnosis not present

## 2022-08-14 DIAGNOSIS — R0989 Other specified symptoms and signs involving the circulatory and respiratory systems: Secondary | ICD-10-CM

## 2022-08-14 NOTE — Telephone Encounter (Signed)
The patient has been notified of the result and verbalized understanding.  All questions (if any) were answered. Gershon Crane, LPN 0/10/4713 8:06 PM

## 2022-08-14 NOTE — Telephone Encounter (Signed)
Left message to call back  

## 2022-08-14 NOTE — Telephone Encounter (Signed)
Left voicemail to call the clinic.

## 2022-08-14 NOTE — Telephone Encounter (Signed)
Mild to moderate carotid disease based on the report.  No indication for intervention.  Continue lipid lowering therapy. Please share results with the eye doctor.

## 2022-08-14 NOTE — Telephone Encounter (Signed)
   Pt is returning call to get result. She said to call her at [phone]

## 2022-08-14 NOTE — Telephone Encounter (Signed)
Pt returning call.  Pt advised per Dr Irish Lack ok to order carotid doppler.  Pt states she was seen in UC yesterday and study was ordered for this morning.  She would like to have Dr Irish Lack review results as well.  Pt states she is supposed to leave to go on a cruise this weekend. Pt advised will forward doppler results to Dr Irish Lack for review and further recommendation.  Narrative & Impression  CLINICAL DATA:  57 year old female with left eye hemorrhage   EXAM: BILATERAL CAROTID DUPLEX ULTRASOUND   TECHNIQUE: Pearline Cables scale imaging, color Doppler and duplex ultrasound were performed of bilateral carotid and vertebral arteries in the neck.   COMPARISON:  None Available.   FINDINGS: Criteria: Quantification of carotid stenosis is based on velocity parameters that correlate the residual internal carotid diameter with NASCET-based stenosis levels, using the diameter of the distal internal carotid lumen as the denominator for stenosis measurement.   The following velocity measurements were obtained:   RIGHT   ICA:  Systolic 505 cm/sec, Diastolic 29 cm/sec   CCA:  83 cm/sec   SYSTOLIC ICA/CCA RATIO:  1.8   ECA:  124 cm/sec   LEFT   ICA:  Systolic 397 cm/sec, Diastolic 41 cm/sec   CCA:  82 cm/sec   SYSTOLIC ICA/CCA RATIO:  1.6   ECA:  87 cm/sec   Right Brachial SBP: 153   Left Brachial SBP: 157   Right Brachial SBP: Not acquired   Left Brachial SBP: Not acquired   RIGHT CAROTID ARTERY: No significant calcifications of the right common carotid artery. Intermediate waveform maintained. Heterogeneous and partially calcified plaque at the right carotid bifurcation. Shadowing present. Low resistance waveform of the right ICA. No significant tortuosity.   RIGHT VERTEBRAL ARTERY: Antegrade flow with low resistance waveform.   LEFT CAROTID ARTERY: No significant calcifications of the left common carotid artery. Intermediate waveform maintained. Heterogeneous and  partially calcified plaque at the left carotid bifurcation. Shadowing present. Low resistance waveform of the left ICA. No significant tortuosity.   LEFT VERTEBRAL ARTERY:  Antegrade flow with low resistance waveform.   IMPRESSION: Heterogeneous and partially calcified plaque at the bilateral carotid bifurcation, with discordant results regarding degree of stenosis by established duplex criteria. Peak velocity suggests 50%-69% stenosis bilaterally, with the ICA/ CCA ratio suggesting a lesser degree of stenosis. If establishing a more accurate degree of stenosis is required, cerebral angiogram should be considered, or as a second best test, CTA.   Signed,   Dulcy Fanny. Nadene Rubins, RPVI   [facility]   [facility]     Electronically Signed   By: Corrie Mckusick D.O.   On: 08/14/2022 10:14

## 2022-08-14 NOTE — Telephone Encounter (Signed)
Need to re order the carotid US for today

## 2022-08-15 NOTE — Telephone Encounter (Signed)
Stacy at [facility] notified of results.  Results routed to Dr Larose Kells

## 2022-08-16 ENCOUNTER — Other Ambulatory Visit: Payer: Self-pay | Admitting: *Deleted

## 2022-08-16 DIAGNOSIS — I6523 Occlusion and stenosis of bilateral carotid arteries: Secondary | ICD-10-CM

## 2022-08-25 ENCOUNTER — Other Ambulatory Visit: Payer: Self-pay | Admitting: Physician Assistant

## 2022-08-29 ENCOUNTER — Emergency Department (HOSPITAL_BASED_OUTPATIENT_CLINIC_OR_DEPARTMENT_OTHER): Payer: 59 | Admitting: Radiology

## 2022-08-29 ENCOUNTER — Emergency Department (HOSPITAL_BASED_OUTPATIENT_CLINIC_OR_DEPARTMENT_OTHER)
Admission: EM | Admit: 2022-08-29 | Discharge: 2022-08-29 | Disposition: A | Discharge status: Home / Self Care | Payer: 59 | Attending: Emergency Medicine | Admitting: Emergency Medicine

## 2022-08-29 ENCOUNTER — Other Ambulatory Visit: Payer: Self-pay

## 2022-08-29 ENCOUNTER — Encounter (HOSPITAL_BASED_OUTPATIENT_CLINIC_OR_DEPARTMENT_OTHER): Payer: Self-pay

## 2022-08-29 ENCOUNTER — Telehealth: Payer: Self-pay | Admitting: Interventional Cardiology

## 2022-08-29 DIAGNOSIS — M549 Dorsalgia, unspecified: Secondary | ICD-10-CM | POA: Insufficient documentation

## 2022-08-29 DIAGNOSIS — E119 Type 2 diabetes mellitus without complications: Secondary | ICD-10-CM | POA: Insufficient documentation

## 2022-08-29 DIAGNOSIS — Z7901 Long term (current) use of anticoagulants: Secondary | ICD-10-CM | POA: Diagnosis not present

## 2022-08-29 DIAGNOSIS — I509 Heart failure, unspecified: Secondary | ICD-10-CM | POA: Diagnosis not present

## 2022-08-29 DIAGNOSIS — I2510 Atherosclerotic heart disease of native coronary artery without angina pectoris: Secondary | ICD-10-CM | POA: Diagnosis not present

## 2022-08-29 DIAGNOSIS — I4891 Unspecified atrial fibrillation: Secondary | ICD-10-CM | POA: Diagnosis not present

## 2022-08-29 DIAGNOSIS — Z794 Long term (current) use of insulin: Secondary | ICD-10-CM | POA: Diagnosis not present

## 2022-08-29 DIAGNOSIS — Z79899 Other long term (current) drug therapy: Secondary | ICD-10-CM | POA: Insufficient documentation

## 2022-08-29 LAB — TROPONIN I (HIGH SENSITIVITY)
Troponin I (High Sensitivity): 5 ng/L (ref <18)
Troponin I (High Sensitivity): 7 ng/L (ref <18)

## 2022-08-29 LAB — BASIC METABOLIC PANEL
Anion gap: 9 (ref 5–15)
BUN: 21 mg/dL — ABNORMAL HIGH (ref 6–20)
CO2: 25 mmol/L (ref 22–32)
Calcium: 9.5 mg/dL (ref 8.9–10.3)
Chloride: 103 mmol/L (ref 98–111)
Creatinine, Ser: 0.92 mg/dL (ref 0.44–1.00)
GFR, Estimated: >60 mL/min (ref >60)
Glucose, Bld: 113 mg/dL — ABNORMAL HIGH (ref 70–99)
Potassium: 4.2 mmol/L (ref 3.5–5.1)
Sodium: 137 mmol/L (ref 135–145)

## 2022-08-29 LAB — CBC
HCT: 36.5 % (ref 36.0–46.0)
Hemoglobin: 12.5 g/dL (ref 12.0–15.0)
MCH: 29.6 pg (ref 26.0–34.0)
MCHC: 34.2 g/dL (ref 30.0–36.0)
MCV: 86.3 fL (ref 80.0–100.0)
Platelets: 175 10*3/uL (ref 150–400)
RBC: 4.23 MIL/uL (ref 3.87–5.11)
RDW: 14.7 % (ref 11.5–15.5)
WBC: 6.3 10*3/uL (ref 4.0–10.5)
nRBC: 0 % (ref 0.0–0.2)

## 2022-08-29 MED ORDER — MAGNESIUM OXIDE 400 MG PO TABS: 400.0000 mg | ORAL_TABLET | Freq: Three times a day (TID) | ORAL | 3 refills | Status: DC

## 2022-08-29 MED ORDER — ACETAMINOPHEN 325 MG PO TABS
650.0000 mg | ORAL_TABLET | Freq: Once | ORAL | Status: AC
  Administered 2022-08-29: 650 mg via ORAL
  Filled 2022-08-29: qty 2

## 2022-08-29 NOTE — ED Triage Notes (Signed)
Patient here POV from Home.  Endorses Sudden Onset of Diaphoresis and Vomiting Saturday. Then progressed to have CP and Left Upper Back pain as well. Intermittent Initially but became constant today.   Some SOB with Exertion. 1 SL NTG with not very much relief.   NAD Noted during Triage. A&Ox4. GCS 15. Ambulatory.

## 2022-08-29 NOTE — ED Provider Notes (Signed)
[facility] Provider Note   CSN: [id number] Arrival date & time: 08/29/22  1247     History  Chief Complaint  Patient presents with   Chest Pain    Courtney Olson is a 57 y.o. female.  Is a 57 year old female with a past medical history of CAD status post stent placement, CHF, A-fib on Xarelto, diabetes presenting to the emergency department with back pain.  The patient states on Saturday she had an episode of nausea and vomiting.  She states that those symptoms resolved but when she woke up on Sunday she felt a mild throbbing pain on the left side of her mid back.  She states that this is where she has had pain in the past from her prior CAD.  She states that the last few days the pain was intermittent but today the pain became constant.  She states that she took a nitro at home without any improvement.  She denies any significant shortness of breath, lightheadedness or dizziness.  She states she has had no further nausea or vomiting since Saturday.  She states that she called her cardiology office this morning who recommended that she come to the ER.  The history is provided by the patient.  Chest Pain      Home Medications Prior to Admission medications   Medication Sig Start Date End Date Taking? Authorizing Provider  ALPRAZolam (XANAX) 0.25 MG tablet Take 1 tablet (0.25 mg total) by mouth 2 (two) times daily as needed. Patient taking differently: Take 0.25 mg by mouth 2 (two) times daily as needed for anxiety (flying). 10/26/20   Salvadore Dom, MD  atorvastatin (LIPITOR) 40 MG tablet TAKE 1 TABLET BY MOUTH  DAILY 08/21/21   Jettie Booze, MD  azithromycin (ZITHROMAX Z-PAK) 250 MG tablet Take two pills today followed by one a day until gone 08/13/22   Raylene Everts, MD  Cholecalciferol (VITAMIN D3 PO) Take 1 capsule by mouth daily.    [provider]  clopidogrel (PLAVIX) 75 MG tablet TAKE 1 TABLET(75 MG) BY  MOUTH DAILY WITH BREAKFAST 05/21/22   Jettie Booze, MD  EPINEPHrine 0.3 mg/0.3 mL IJ SOAJ injection Inject 0.3 mLs into the skin as needed. 12/16/16   [provider]  estradiol (ESTRACE VAGINAL) 0.1 MG/GM vaginal cream Place 1 g vaginally 2 (two) times a week. Initial dose: nightly x 2 weeks, then twice weekly 05/31/22   Marny Lowenstein A, NP  ezetimibe (ZETIA) 10 MG tablet TAKE 1 TABLET(10 MG) BY MOUTH DAILY 04/30/22   Jettie Booze, MD  Fluocinolone Acetonide 0.01 % OIL Place in ear(s). 04/28/22   [provider]  fluocinonide (LIDEX) 0.05 % external solution Apply topically. 04/30/22   [provider]  furosemide (LASIX) 20 MG tablet Take 20 mg by mouth as needed for fluid or edema.    [provider]  insulin glargine, 2 Unit Dial, (TOUJEO MAX SOLOSTAR) 300 UNIT/ML Solostar Pen Inject 18 Units into the skin 2 (two) times daily.    [provider]  insulin lispro (HUMALOG) 100 UNIT/ML injection Inject 18 Units into the skin 2 (two) times daily.    [provider]  isosorbide mononitrate (IMDUR) 30 MG 24 hr tablet Take 15 mg by mouth daily. 05/30/22   [provider]  magnesium oxide (MAG-OX) 400 MG tablet Take 1 tablet (400 mg total) by mouth in the morning, at noon, and at bedtime. 08/29/22   Irish Lack,  Charlann Lange, MD  mesalamine (LIALDA) 1.2 g EC tablet Take 1.2 g by mouth daily.     [provider]  metoprolol succinate (TOPROL-XL) 25 MG 24 hr tablet TAKE 1 TABLET(25 MG) BY MOUTH DAILY 04/30/22   Jettie Booze, MD  Multiple Vitamin (MULTIVITAMIN) tablet Take 1 tablet by mouth daily. Pack Heart healthy    [provider]  nitroGLYCERIN (NITROSTAT) 0.4 MG SL tablet Place 1 tablet (0.4 mg total) under the tongue every 5 (five) minutes as needed. 06/27/21   Reino Bellis B, NP  ramipril (ALTACE) 2.5 MG capsule TAKE 1 CAPSULE(2.5 MG) BY MOUTH DAILY 05/08/22   Jettie Booze, MD  rivaroxaban  (XARELTO) 20 MG TABS tablet Take 1 tablet (20 mg total) by mouth daily. 08/31/21   Jettie Booze, MD  TRULICITY 4.5 0000000 SOPN Inject into the skin once a week. 03/01/22   [provider]      Allergies    Orange (diagnostic), Orange fruit [citrus], Empagliflozin, Metformin hcl, Nitrofurantoin, and Bactrim [sulfamethoxazole-trimethoprim]    Review of Systems   Review of Systems  Cardiovascular:  Positive for chest pain.    Physical Exam Updated Vital Signs BP (!) 176/102   Pulse 70   Temp 97.9 F (36.6 C)   Resp 19   Ht 5' 5"$  (1.651 m)   Wt 92.1 kg   LMP 07/16/2000 (Approximate)   SpO2 99%   BMI 33.79 kg/m  Physical Exam Vitals and nursing note reviewed.  Constitutional:      General: She is not in acute distress.    Appearance: She is well-developed.  HENT:     Head: Normocephalic and atraumatic.  Eyes:     Extraocular Movements: Extraocular movements intact.  Cardiovascular:     Rate and Rhythm: Normal rate and regular rhythm.     Pulses:          Radial pulses are 2+ on the right side and 2+ on the left side.     Heart sounds: Normal heart sounds.  Pulmonary:     Effort: Pulmonary effort is normal.     Breath sounds: Normal breath sounds.  Chest:     Chest wall: No tenderness.  Abdominal:     Palpations: Abdomen is soft.  Musculoskeletal:        General: Normal range of motion.     Cervical back: Normal range of motion and neck supple.     Right lower leg: No edema.     Left lower leg: No edema.     Comments: No midline back tenderness, no reproducible muscular tenderness on her back  Skin:    General: Skin is warm and dry.  Neurological:     General: No focal deficit present.     Mental Status: She is alert and oriented to person, place, and time.     ED Results / Procedures / Treatments   Labs (all labs ordered are listed, but only abnormal results are displayed) Labs Reviewed  BASIC METABOLIC PANEL - Abnormal; Notable for the  following components:      Result Value   Glucose, Bld 113 (*)    BUN 21 (*)    All other components within normal limits  CBC  TROPONIN I (HIGH SENSITIVITY)  TROPONIN I (HIGH SENSITIVITY)    EKG EKG Interpretation  Date/Time:  Wednesday August 29 2022 12:53:30 EST Ventricular Rate:  73 PR Interval:  168 QRS Duration: 92 QT Interval:  376 QTC Calculation: 414 R  Axis:   64 Text Interpretation: Normal sinus rhythm Incomplete right bundle branch block Cannot rule out Anterior infarct , age undetermined Abnormal ECG When compared with ECG of 02-Mar-2022 15:12, No significant change was found Confirmed by Leanord Asal ([id number]) on 08/29/2022 1:51:54 PM  Radiology DG Chest 2 View  Result Date: 08/29/2022 CLINICAL DATA:  Chest pain diaphoresis EXAM: CHEST - 2 VIEW COMPARISON:  08/13/2022 FINDINGS: The heart size and mediastinal contours are within normal limits. Both lungs are clear. The visualized skeletal structures are unremarkable. External artifact overlies the right upper lung. Trachea midline. Aorta atherosclerotic. IMPRESSION: No active cardiopulmonary disease. Electronically Signed   By: Jerilynn Mages.  Shick M.D.   On: 08/29/2022 13:14    Procedures Procedures    Medications Ordered in ED Medications  acetaminophen (TYLENOL) tablet 650 mg (has no administration in time range)    ED Course/ Medical Decision Making/ A&P Clinical Course as of 08/29/22 1546  Wed Aug 29, 2022  1441 EKG has no acute ischemic changes and troponin is negative.  Patient describes her symptoms as similar to when she previously needed to have stents placed in plan to consult cardiology for disposition recommendations. [VK]  1507 I spoke with Dr. Burt Knack of cardiology who recommended repeat troponin and if negative would be stable for outpatient follow up. [VK]  22 57 yo F with hx of CAD sp PCI. Having pain in back that is similar to prior episodes. Constant and prior. Initial troponin was negative. Talked to  cards who feels that she does not require admission but will set up an outpatient appointment for her.  [RP]    Clinical Course User Index [RP] Fransico Meadow, MD [VK] Kemper Durie, DO                             Medical Decision Making This patient presents to the ED with chief complaint(s) of back pain with pertinent past medical history of CAD status post stent placement, A-fib, CHF, diabetes which further complicates the presenting complaint. The complaint involves an extensive differential diagnosis and also carries with it a high risk of complications and morbidity.    The differential diagnosis includes atypical ACS, pneumonia, pneumothorax, pulmonary edema, pleural effusion, muscular pain  Additional history obtained: Additional history obtained from N/A Records reviewed outpatient cardiology records  ED Course and Reassessment: Patient had EKG performed on arrival that had no acute ischemic changes.  Labs including troponin will be performed.  She was given Tylenol for pain control and she will be closely reassessed.  Independent labs interpretation:  The following labs were independently interpreted: Within normal range with negative initial troponin  Independent visualization of imaging: - I independently visualized the following imaging with scope of interpretation limited to determining acute life threatening conditions related to emergency care: Chest x-ray, which revealed no acute disease  Consultation: - Consulted or discussed management/test interpretation w/ external professional: Cardiology      Amount and/or Complexity of Data Reviewed Labs: ordered. Radiology: ordered.  Risk OTC drugs.          Final Clinical Impression(s) / ED Diagnoses Final diagnoses:  Mid back pain on left side    Rx / DC Orders ED Discharge Orders     None         Kemper Durie, DO 08/29/22 1547

## 2022-08-29 NOTE — ED Notes (Signed)
Discharge paperwork given and verbally understood. Pt had no current complaint of pain, sweating or SOB to note... Pt stated that she had no IV.Marland KitchenMarland Kitchen

## 2022-08-29 NOTE — Telephone Encounter (Signed)
Discussed w/ MD, Dr. Irish Lack. He is agreeable pt should go to ED for evaluation. Called pt and informed of above advisement. Pt agreeable to plan, she may start w/ an urgent care.  Informed that they may/may not see her due to symptoms if she does choose that.  Aware our advisement is ED

## 2022-08-29 NOTE — ED Provider Notes (Signed)
  Physical Exam  BP (!) 150/82   Pulse 65   Temp 97.9 F (36.6 C)   Resp (!) 21   Ht 5' 5"$  (1.651 m)   Wt 92.1 kg   LMP 07/16/2000 (Approximate)   SpO2 98%   BMI 33.79 kg/m   Physical Exam  Procedures  Procedures  ED Course / MDM   Clinical Course as of 09/01/22 1536  Wed Aug 29, 2022  1441 EKG has no acute ischemic changes and troponin is negative.  Patient describes her symptoms as similar to when she previously needed to have stents placed in plan to consult cardiology for disposition recommendations. [VK]  1507 I spoke with Dr. Burt Knack of cardiology who recommended repeat troponin and if negative would be stable for outpatient follow up. [VK]  93 57 yo F with hx of CAD sp PCI. Having pain in back that is similar to prior episodes. Constant and prior. Initial troponin was negative. Talked to cards who feels that she does not require admission but will set up an outpatient appointment for her.  [RP]  1535 Repeat troponin has returned and is normal.  Patient is overall very well-appearing.  Appears to have a HEART Score of 4 and since she is medium risk risk did discuss inpatient admission versus outpatient treatment with the patient.  She opted for cardiology follow-up.  Ambulatory referral placed.  Patient discharged home in stable condition.  Return precautions discussed prior to discharge. [RP]    Clinical Course User Index [RP] Fransico Meadow, MD [VK] Kemper Durie, DO   Medical Decision Making Amount and/or Complexity of Data Reviewed Labs: ordered. Radiology: ordered.  Risk OTC drugs.     Fransico Meadow, MD 09/01/22 1536

## 2022-08-29 NOTE — Telephone Encounter (Signed)
Just got home from a cruise on Sunday. Laying in bed that night she started not feeling well. Experiencing nausea/vomiting. Chest/arm/shoulder pain. Took some Zofran and went to bed. Starting Monday arm pain began again, same pain as when she had her last heart attack. This morning started becoming a constant pain. Took nitro this morning, no relief. Advised to go to ED, pt hesitant d/t current illness in area and EDs full (her dtr works for [facility] ED). She requested I speak w/ MD Irish Lack) on this as he has had her come to the office before for an EKG. Aware I will discuss with him and let her know.

## 2022-08-29 NOTE — Discharge Instructions (Addendum)
Were seen in the emergency department for your back pain.  Your workup today showed no signs of heart attack or abnormality within your lungs.  It is unclear if this is a muscle type pain or if this could be angina and you should follow-up with your cardiologist as scheduled at the end of this week for further workup and evaluation.  Please follow-up with your primary doctor in 2 to 3 days as well.  You should return to the emergency department for significantly worsening pain, severe shortness of breath, chest pain or if you have any other new or concerning symptoms.

## 2022-08-29 NOTE — Telephone Encounter (Signed)
  Pt c/o of Chest Pain: STAT if CP now or developed within 24 hours  1. Are you having CP right now? Yes pain in the back on her left shoulder   2. Are you experiencing any other symptoms (ex. SOB, nausea, vomiting, sweating)? Vomiting   3. How long have you been experiencing CP? Sunday night   4. Is your CP continuous or coming and going? continuous   5. Have you taken Nitroglycerin? Yes   Sunday morning when she woke up she throws up and since then she's been feeling off, her arms and neck started to feel pain really bad and this morning she felt pain in the back of her left shoulder and she took 1 nitroglycerin. She said she still feel the pain right now. She said this is the same feeling when she had 100% blockage   ?

## 2022-08-31 ENCOUNTER — Encounter: Payer: Self-pay | Admitting: Interventional Cardiology

## 2022-08-31 ENCOUNTER — Ambulatory Visit: Payer: 59 | Attending: Interventional Cardiology | Admitting: Interventional Cardiology

## 2022-08-31 VITALS — BP 142/78 | HR 67 | Ht 65.0 in | Wt 210.0 lb

## 2022-08-31 DIAGNOSIS — E119 Type 2 diabetes mellitus without complications: Secondary | ICD-10-CM | POA: Diagnosis not present

## 2022-08-31 DIAGNOSIS — E7800 Pure hypercholesterolemia, unspecified: Secondary | ICD-10-CM | POA: Diagnosis not present

## 2022-08-31 DIAGNOSIS — I10 Essential (primary) hypertension: Secondary | ICD-10-CM | POA: Diagnosis not present

## 2022-08-31 DIAGNOSIS — I2510 Atherosclerotic heart disease of native coronary artery without angina pectoris: Secondary | ICD-10-CM

## 2022-08-31 DIAGNOSIS — I480 Paroxysmal atrial fibrillation: Secondary | ICD-10-CM

## 2022-08-31 NOTE — Progress Notes (Signed)
Cardiology Office Note   Date:  08/31/2022   ID:  Courtney, Olson 02-09-1966, MRN [id number]  PCP:  Orpah Melter, MD    No chief complaint on file.  CAD  Wt Readings from Last 3 Encounters:  08/31/22 210 lb (95.3 kg)  08/29/22 203 lb 0.7 oz (92.1 kg)  08/13/22 203 lb (92.1 kg)       History of Present Illness: Courtney Olson is a 57 y.o. female   with CAD.  Several interventions have been performed on her RCA.    "Coronary artery disease  S/p DES to RCA in 05/2019 S/p DES to RCA in 06/2021 due to ISR  S/p 3.5 x 12 mm Synergy DES to [facility] and shockwave lithotripsy to mRCA ISR in 02/2022 Paroxysmal atrial fibrillation  Rx w Rivaroxaban  Monitor 07/2021: no AFib Aortic atherosclerosis  Hypertension  Diabetes mellitus, insulin dependent  Hyperlipidemia  OSA Bradycardia Hx of vasovagal syncope  Hx of pancreatitis  S/p Renal mass excision (benign) Ulcerative colitis  Hx of CMV hepatitis  Rheumatoid arthritis    Prior CV Studies: LEFT HEART CATH 03/02/2022 D1 60, D2 60 LCx ostial 25 RCA proximal 25, mid 90, mid stent 95 ISR, distal 25 PCI: 3.5 x 12 mm Synergy DES to the mid RCA PCI: Balloon angioplasty followed by coronary lithotripsy to mid RCA stent   Successful PCI of the mid RCA with intravascular lithotripsy.  Stent placed into the more proximal lesion across the proximal edge of prior stent.  The in-stent restenosis in the prior overlap area was treated with shockwave balloon and [place] balloon."   LONG TERM MONITOR (8-14 DAYS) INTERPRETATION 07/24/2021 Normal sinus rhythm with rare PACs and PVCs. // Rare, brief runs of PACs. // Patient symptoms correlated to PVCs. // No atrial fibrillation.   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    Noted in 11/23:One episode of chest pain when she had 4 alcoholic beverages on a cruise. Also noted leg edema, when on long car trips.   Had a GI fullness at that time where she was going to follow-up with Dr. Michail Sermon.  Called office in 2/24: "Just got home from a cruise on Sunday. Laying in bed that night she started not feeling well. Experiencing nausea/vomiting. Chest/arm/shoulder pain. Took some Zofran and went to bed. Starting Monday arm pain began again, same pain as when she had her last heart attack. This morning started becoming a constant pain. Took nitro this morning, no relief." Negative w/u in ER.  Was sent home.   Did not drink.  Gained weight over the past few years.  Prefers to be around 180 lbs.   In the past 2 days since ER visit, Denies : Chest pain. Dizziness. Leg edema. Nitroglycerin use. Orthopnea. Palpitations. Paroxysmal nocturnal dyspnea. Shortness of breath. Syncope.      Past Medical History:  Diagnosis Date   Aortic calcification (HCC)    Atrial tachycardia    Atrophic vaginitis 01/2006   Bradycardia 05/2014   CAD (coronary artery disease)    cardiac cath 05/21/2019 showing 805 mRCA, 60% D1 and D2 that were very small and patent LAD and LCx.  She underwent DES to the [facility] with residual 25% stenosis in the dRCA   Chronic pancolonic ulcerative colitis ([place]) 2015   unknown if chronic.   CKD (chronic kidney disease), stage II    Cytomegaloviral hepatitis ([place]) 2005  Diabetes mellitus without complication ([place]) [id number]   on Ace inhibitor to protect kidneys   Gallstones 01/2013   GERD (gastroesophageal reflux disease)    Heart failure ([place]) 10/26/2020   Hyperlipidemia    Hypertension    Kidney tumor 03/2013   Right   NSVT (nonsustained ventricular tachycardia) (HCC)    Obesity    OSA (obstructive sleep apnea)    PAF (paroxysmal atrial fibrillation) ([place])    Pancreatitis 2014   Postmenopausal    Premature atrial contractions    PVC's (premature ventricular contractions)    RA (rheumatoid arthritis) ([place])    Syncope and collapse    Ulcerative colitis ([place]) 2015    Past  Surgical History:  Procedure Laterality Date   CESAREAN SECTION     CHOLECYSTECTOMY  01/2013   COLONOSCOPY  2015   Colitis   CORONARY LITHOTRIPSY N/A 03/02/2022   Procedure: CORONARY LITHOTRIPSY;  Surgeon: Jettie Booze, MD;  Location: [place] [facility];  Service: Cardiovascular;  Laterality: N/A;   CORONARY STENT INTERVENTION N/A 05/21/2019   Procedure: CORONARY STENT INTERVENTION;  Surgeon: Jettie Booze, MD;  Location: [place] [facility];  Service: Cardiovascular;  Laterality: N/A;   CORONARY STENT INTERVENTION Right 06/27/2021   Procedure: CORONARY STENT INTERVENTION;  Surgeon: Jettie Booze, MD;  Location: [place] [facility];  Service: Cardiovascular;  Laterality: Right;   CORONARY STENT INTERVENTION N/A 03/02/2022   Procedure: CORONARY STENT INTERVENTION;  Surgeon: Jettie Booze, MD;  Location: [place] [facility];  Service: Cardiovascular;  Laterality: N/A;   DILATION AND CURETTAGE OF UTERUS  2002   HERNIA REPAIR  01/2013   INTRAVASCULAR ULTRASOUND/IVUS N/A 03/02/2022   Procedure: Intravascular Ultrasound/IVUS;  Surgeon: Jettie Booze, MD;  Location: [place] [facility];  Service: Cardiovascular;  Laterality: N/A;   Kidney and Bladder Stent and tube placed Right 03/2013   LEFT HEART CATH AND CORONARY ANGIOGRAPHY N/A 06/27/2021   Procedure: LEFT HEART CATH AND CORONARY ANGIOGRAPHY;  Surgeon: Jettie Booze, MD;  Location: [place] [facility];  Service: Cardiovascular;  Laterality: N/A;   LEFT HEART CATH AND CORONARY ANGIOGRAPHY N/A 03/02/2022   Procedure: LEFT HEART CATH AND CORONARY ANGIOGRAPHY;  Surgeon: Jettie Booze, MD;  Location: [place] [facility];  Service: Cardiovascular;  Laterality: N/A;   PARTIAL NEPHRECTOMY Right 02/2013   Tumor Removal    Procedure to remove kidney and bladder stent and tube Right 04/2013   RIGHT/LEFT HEART CATH AND CORONARY ANGIOGRAPHY N/A 05/21/2019   Procedure: RIGHT/LEFT HEART CATH AND CORONARY ANGIOGRAPHY;   Surgeon: Jettie Booze, MD;  Location: [place] [facility];  Service: Cardiovascular;  Laterality: N/A;   TOTAL ABDOMINAL HYSTERECTOMY W/ BILATERAL SALPINGOOPHORECTOMY  2002   Secondary to AUB     Current Outpatient Medications  Medication Sig Dispense Refill   ALPRAZolam (XANAX) 0.25 MG tablet Take 1 tablet (0.25 mg total) by mouth 2 (two) times daily as needed. (Patient taking differently: Take 0.25 mg by mouth 2 (two) times daily as needed for anxiety (flying).) 15 tablet 0   atorvastatin (LIPITOR) 40 MG tablet TAKE 1 TABLET BY MOUTH  DAILY 90 tablet 3   azithromycin (ZITHROMAX Z-PAK) 250 MG tablet Take two pills today followed by one a day until gone 6 tablet 0   benzonatate (TESSALON) 100 MG capsule Take 100 mg by mouth 3 (three) times daily.     Cholecalciferol (VITAMIN D3 PO) Take 1 capsule by mouth daily.     clopidogrel (PLAVIX)  75 MG tablet TAKE 1 TABLET(75 MG) BY MOUTH DAILY WITH BREAKFAST 90 tablet 3   EPINEPHrine 0.3 mg/0.3 mL IJ SOAJ injection Inject 0.3 mLs into the skin as needed.  2   estradiol (ESTRACE VAGINAL) 0.1 MG/GM vaginal cream Place 1 g vaginally 2 (two) times a week. Initial dose: nightly x 2 weeks, then twice weekly 42.5 g 3   ezetimibe (ZETIA) 10 MG tablet TAKE 1 TABLET(10 MG) BY MOUTH DAILY 90 tablet 3   Fluocinolone Acetonide 0.01 % OIL Place in ear(s).     fluocinonide (LIDEX) 0.05 % external solution Apply topically.     fluticasone (FLONASE) 50 MCG/ACT nasal spray Place into both nostrils.     furosemide (LASIX) 20 MG tablet Take 20 mg by mouth as needed for fluid or edema.     HUMALOG KWIKPEN 100 UNIT/ML KwikPen Inject into the skin.     isosorbide mononitrate (IMDUR) 30 MG 24 hr tablet Take 15 mg by mouth daily.     ketoconazole (NIZORAL) 2 % cream Apply 1 Application topically 2 (two) times daily as needed.     magnesium oxide (MAG-OX) 400 MG tablet Take 1 tablet (400 mg total) by mouth in the morning, at noon, and at bedtime. 270 tablet 3    mesalamine (LIALDA) 1.2 g EC tablet Take 1.2 g by mouth daily.      metoprolol succinate (TOPROL-XL) 25 MG 24 hr tablet TAKE 1 TABLET(25 MG) BY MOUTH DAILY 90 tablet 3   Multiple Vitamin (MULTIVITAMIN) tablet Take 1 tablet by mouth daily. Pack Heart healthy     nitroGLYCERIN (NITROSTAT) 0.4 MG SL tablet Place 1 tablet (0.4 mg total) under the tongue every 5 (five) minutes as needed. 25 tablet 2   ramipril (ALTACE) 2.5 MG capsule TAKE 1 CAPSULE(2.5 MG) BY MOUTH DAILY 90 capsule 3   rivaroxaban (XARELTO) 20 MG TABS tablet Take 1 tablet (20 mg total) by mouth daily. 90 tablet 0   Semaglutide (OZEMPIC, 0.25 OR 0.5 MG/DOSE, [place]) Inject 0.5 mg into the skin once a week.     TRULICITY 4.5 0000000 SOPN Inject into the skin once a week.     TOUJEO SOLOSTAR 300 UNIT/ML Solostar Pen Inject 20 Units into the skin in the morning and at bedtime.     No current facility-administered medications for this visit.    Allergies:   Orange (diagnostic), Orange fruit [citrus], Empagliflozin, Metformin hcl, Nitrofurantoin, and Bactrim [sulfamethoxazole-trimethoprim]    Social History:  The patient  reports that she has never smoked. She has never used smokeless tobacco. She reports current alcohol use. She reports that she does not use drugs.   Family History:  The patient's family history includes Breast cancer (age of onset: 97) in her sister; Cancer in her maternal grandmother and paternal aunt; Diabetes in her father, maternal grandmother, mother, and paternal aunt; Fibromyalgia in her mother; Heart disease in her father; Hypertension in her father, mother, and sister.    ROS:  Please see the history of present illness.   Otherwise, review of systems are positive for nausea /vomiting at the time of neck and chest pain.   All other systems are reviewed and negative.    PHYSICAL EXAM: VS:  BP (!) 142/78   Pulse 67   Ht 5' 5"$  (1.651 m)   Wt 210 lb (95.3 kg)   LMP 07/16/2000 (Approximate)   SpO2 98%   BMI  34.95 kg/m  , BMI Body mass index is 34.95 kg/m. GEN: Well nourished,  well developed, in no acute distress HEENT: normal Neck: no JVD, carotid bruits, or masses Cardiac: RRR; no murmurs, rubs, or gallops,no edema  Respiratory:  clear to auscultation bilaterally, normal work of breathing GI: soft, nontender, nondistended, + BS MS: no deformity or atrophy Skin: warm and dry, no rash Neuro:  Strength and sensation are intact Psych: euthymic mood, full affect   EKG:   The ekg ordered 2/24 demonstrates NSR, no ST changes   Recent Labs: 02/23/2022: NT-Pro BNP 348 05/21/2022: ALT 24; Magnesium 1.8 08/29/2022: BUN 21; Creatinine, Ser 0.92; Hemoglobin 12.5; Platelets 175; Potassium 4.2; Sodium 137   Lipid Panel    Component Value Date/Time   CHOL 133 02/23/2022 1232   TRIG 104 02/23/2022 1232   HDL 68 02/23/2022 1232   CHOLHDL 2.0 02/23/2022 1232   CHOLHDL 4 05/17/2014 1113   VLDL 34.4 05/17/2014 1113   LDLCALC 46 02/23/2022 1232     Other studies Reviewed: Additional studies/ records that were reviewed today with results demonstrating: labs reviewed.   ASSESSMENT AND PLAN:  CAD: Status post several RCA PCI's.  Negative workup in the emergency room.  Of note, she also had chest discomfort after a cruise stated with alcohol consumption in late 2023.   We spoke about heart cath as I would not plan any noninvasive w/u given her history.  She did want to defer cath at this time as there were some differences in her prior angina.  If sx persist, would plan for cath. Hyperlipidemia: LDL 46 in August 2023.  Continue lipid-lowering therapy. HTN: The current medical regimen is effective;  continue present plan and medications. PAF: in NSR.  No symptoms of atrial fibrillation. Anticoagulated:Xarelto for stroke prevention. DM: A1C 7.8 in 11/23.  Whole food, plant-based diet.  We spoke about trying to increase exercise.  We spoke about adding some resistance training as well as this may help  with weight loss.  She does feel that she is very out of shape now.   Current medicines are reviewed at length with the patient today.  The patient concerns regarding her medicines were addressed.  The following changes have been made:  No change  Labs/ tests ordered today include:  No orders of the defined types were placed in this encounter.   Recommend 150 minutes/week of aerobic exercise Low fat, low carb, high fiber diet recommended  Disposition:   FU in Sept 2024   Signed, Larae Grooms, MD  08/31/2022 8:43 AM    [address], [place], [place]  [address] Phone: [phone]; Fax: [phone]

## 2022-08-31 NOTE — Patient Instructions (Signed)
Medication Instructions:  Your physician recommends that you continue on your current medications as directed. Please refer to the Current Medication list given to you today.  *If you need a refill on your cardiac medications before your next appointment, please call your pharmacy*   Lab Work: none If you have labs (blood work) drawn today and your tests are completely normal, you will receive your results only by: [place] (if you have MyChart) OR A paper copy in the mail If you have any lab test that is abnormal or we need to change your treatment, we will call you to review the results.   Testing/Procedures: none   Follow-Up: At [facility], you and your health needs are our priority.  As part of our continuing mission to provide you with exceptional heart care, we have created designated Provider Care Teams.  These Care Teams include your primary Cardiologist (physician) and Advanced Practice Providers (APPs -  Physician Assistants and Nurse Practitioners) who all work together to provide you with the care you need, when you need it.  We recommend signing up for the patient portal called "MyChart".  Sign up information is provided on this After Visit Summary.  MyChart is used to connect with patients for Virtual Visits (Telemedicine).  Patients are able to view lab/test results, encounter notes, upcoming appointments, etc.  Non-urgent messages can be sent to your provider as well.   To learn more about what you can do with MyChart, go to [email or web address].    Your next appointment:   03/26/23 at 9:20  Provider:   Larae Grooms, MD     Other Instructions

## 2022-09-19 ENCOUNTER — Telehealth: Payer: Self-pay | Admitting: Interventional Cardiology

## 2022-09-19 NOTE — Telephone Encounter (Signed)
[facility] office is calling requesting recent test, VAS US CAROTID, to be faxed to them.  Fax # [phone]

## 2022-09-20 NOTE — Telephone Encounter (Signed)
Call placed to number listed as incoming call number.  Office is [employer].  Fax number confirmed.  Carotid doppler results faxed

## 2022-10-19 LAB — LAB REPORT - SCANNED
A1c: 6.9
Creatinine, POC: 133 mg/dL
EGFR: 68
Microalb Creat Ratio: 35.7
Microalbumin, Urine: 4.74

## 2022-10-26 ENCOUNTER — Other Ambulatory Visit: Payer: Self-pay | Admitting: Family Medicine

## 2022-10-26 DIAGNOSIS — R0789 Other chest pain: Secondary | ICD-10-CM

## 2022-10-30 ENCOUNTER — Encounter: Payer: Self-pay | Admitting: Family Medicine

## 2022-10-31 ENCOUNTER — Ambulatory Visit
Admission: RE | Admit: 2022-10-31 | Discharge: 2022-10-31 | Disposition: A | Discharge status: Home / Self Care | Payer: 59 | Source: Ambulatory Visit | Attending: Family Medicine | Admitting: Family Medicine

## 2022-10-31 DIAGNOSIS — R0789 Other chest pain: Secondary | ICD-10-CM

## 2022-11-01 ENCOUNTER — Other Ambulatory Visit: Payer: 59

## 2023-03-24 NOTE — Progress Notes (Unsigned)
Cardiology Office Note   Date:  03/24/2023   ID:  [name], [name] 08-Dec-1965, MRN [id number]  PCP:  Joycelyn Rua, MD    No chief complaint on file.  CAD  Wt Readings from Last 3 Encounters:  08/31/22 210 lb (95.3 kg)  08/29/22 203 lb 0.7 oz (92.1 kg)  08/13/22 203 lb (92.1 kg)       History of Present Illness: Courtney Olson is a 57 y.o. female   with CAD.  Several interventions have been performed on her RCA.    "Coronary artery disease  S/p DES to RCA in 05/2019 S/p DES to RCA in 06/2021 due to ISR  S/p 3.5 x 12 mm Synergy DES to [facility] and shockwave lithotripsy to mRCA ISR in 02/2022 Paroxysmal atrial fibrillation  Rx w Rivaroxaban  Monitor 07/2021: no AFib Aortic atherosclerosis  Hypertension  Diabetes mellitus, insulin dependent  Hyperlipidemia  OSA Bradycardia Hx of vasovagal syncope  Hx of pancreatitis  S/p Renal mass excision (benign) Ulcerative colitis  Hx of CMV hepatitis  Rheumatoid arthritis    Prior CV Studies: LEFT HEART CATH 03/02/2022 D1 60, D2 60 LCx ostial 25 RCA proximal 25, mid 90, mid stent 95 ISR, distal 25 PCI: 3.5 x 12 mm Synergy DES to the mid RCA PCI: Balloon angioplasty followed by coronary lithotripsy to mid RCA stent   Successful PCI of the mid RCA with intravascular lithotripsy.  Stent placed into the more proximal lesion across the proximal edge of prior stent.  The in-stent restenosis in the prior overlap area was treated with shockwave balloon and [place] balloon."   LONG TERM MONITOR (8-14 DAYS) INTERPRETATION 07/24/2021 Normal sinus rhythm with rare PACs and PVCs. // Rare, brief runs of PACs. // Patient symptoms correlated to PVCs. // No atrial fibrillation.   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    Noted in 11/23:One episode of chest pain when she had 4 alcoholic beverages on a cruise. Also noted leg edema, when on long car trips.   Had a GI fullness at that time where she was going to follow-up with Dr. Bosie Clos.  Had negative workup in the ER in February 2024.    Past Medical History:  Diagnosis Date   Aortic calcification (HCC)    Atrial tachycardia    Atrophic vaginitis 01/2006   Bradycardia 05/2014   CAD (coronary artery disease)    cardiac cath 05/21/2019 showing 805 mRCA, 60% D1 and D2 that were very small and patent LAD and LCx.  She underwent DES to [facility] with residual 25% stenosis in the dRCA   Chronic pancolonic ulcerative colitis (HCC) 2015   unknown if chronic.   CKD (chronic kidney disease), stage II    Cytomegaloviral hepatitis (HCC) 2005   Diabetes mellitus without complication (HCC) 01/2006   on Ace inhibitor to protect kidneys   Gallstones 01/2013   GERD (gastroesophageal reflux disease)    Heart failure (HCC) 10/26/2020   Hyperlipidemia    Hypertension    Kidney tumor 03/2013   Right   NSVT (nonsustained ventricular tachycardia) (HCC)    Obesity    OSA (obstructive sleep apnea)    PAF (paroxysmal atrial fibrillation) (HCC)    Pancreatitis 2014   Postmenopausal    Premature atrial contractions    PVC's (premature ventricular contractions)    RA (rheumatoid arthritis) (HCC)    Syncope and collapse    Ulcerative  colitis (HCC) 2015    Past Surgical History:  Procedure Laterality Date   CESAREAN SECTION     CHOLECYSTECTOMY  01/2013   COLONOSCOPY  2015   Colitis   CORONARY LITHOTRIPSY N/A 03/02/2022   Procedure: CORONARY LITHOTRIPSY;  Surgeon: Corky Crafts, MD;  Location: [facility] INVASIVE CV LAB;  Service: Cardiovascular;  Laterality: N/A;   CORONARY STENT INTERVENTION N/A 05/21/2019   Procedure: CORONARY STENT INTERVENTION;  Surgeon: Corky Crafts, MD;  Location: [facility] INVASIVE CV LAB;  Service: Cardiovascular;  Laterality: N/A;   CORONARY STENT INTERVENTION Right 06/27/2021   Procedure: CORONARY STENT INTERVENTION;  Surgeon: Corky Crafts, MD;  Location: [facility] INVASIVE  CV LAB;  Service: Cardiovascular;  Laterality: Right;   CORONARY STENT INTERVENTION N/A 03/02/2022   Procedure: CORONARY STENT INTERVENTION;  Surgeon: Corky Crafts, MD;  Location: [facility] INVASIVE CV LAB;  Service: Cardiovascular;  Laterality: N/A;   CORONARY ULTRASOUND/IVUS N/A 03/02/2022   Procedure: Intravascular Ultrasound/IVUS;  Surgeon: Corky Crafts, MD;  Location: [facility] INVASIVE CV LAB;  Service: Cardiovascular;  Laterality: N/A;   DILATION AND CURETTAGE OF UTERUS  2002   HERNIA REPAIR  01/2013   Kidney and Bladder Stent and tube placed Right 03/2013   LEFT HEART CATH AND CORONARY ANGIOGRAPHY N/A 06/27/2021   Procedure: LEFT HEART CATH AND CORONARY ANGIOGRAPHY;  Surgeon: Corky Crafts, MD;  Location: [facility] INVASIVE CV LAB;  Service: Cardiovascular;  Laterality: N/A;   LEFT HEART CATH AND CORONARY ANGIOGRAPHY N/A 03/02/2022   Procedure: LEFT HEART CATH AND CORONARY ANGIOGRAPHY;  Surgeon: Corky Crafts, MD;  Location: [facility] INVASIVE CV LAB;  Service: Cardiovascular;  Laterality: N/A;   PARTIAL NEPHRECTOMY Right 02/2013   Tumor Removal    Procedure to remove kidney and bladder stent and tube Right 04/2013   RIGHT/LEFT HEART CATH AND CORONARY ANGIOGRAPHY N/A 05/21/2019   Procedure: RIGHT/LEFT HEART CATH AND CORONARY ANGIOGRAPHY;  Surgeon: Corky Crafts, MD;  Location: [facility] INVASIVE CV LAB;  Service: Cardiovascular;  Laterality: N/A;   TOTAL ABDOMINAL HYSTERECTOMY W/ BILATERAL SALPINGOOPHORECTOMY  2002   Secondary to AUB     Current Outpatient Medications  Medication Sig Dispense Refill   ALPRAZolam (XANAX) 0.25 MG tablet Take 1 tablet (0.25 mg total) by mouth 2 (two) times daily as needed. (Patient taking differently: Take 0.25 mg by mouth 2 (two) times daily as needed for anxiety (flying).) 15 tablet 0   atorvastatin (LIPITOR) 40 MG tablet TAKE 1 TABLET BY MOUTH  DAILY 90 tablet 3   azithromycin (ZITHROMAX Z-PAK) 250 MG tablet Take two pills today followed by one a day  until gone 6 tablet 0   benzonatate (TESSALON) 100 MG capsule Take 100 mg by mouth 3 (three) times daily.     Cholecalciferol (VITAMIN D3 PO) Take 1 capsule by mouth daily.     clopidogrel (PLAVIX) 75 MG tablet TAKE 1 TABLET(75 MG) BY MOUTH DAILY WITH BREAKFAST 90 tablet 3   EPINEPHrine 0.3 mg/0.3 mL IJ SOAJ injection Inject 0.3 mLs into the skin as needed.  2   estradiol (ESTRACE VAGINAL) 0.1 MG/GM vaginal cream Place 1 g vaginally 2 (two) times a week. Initial dose: nightly x 2 weeks, then twice weekly 42.5 g 3   ezetimibe (ZETIA) 10 MG tablet TAKE 1 TABLET(10 MG) BY MOUTH DAILY 90 tablet 3   Fluocinolone Acetonide 0.01 % OIL Place in ear(s).     fluocinonide (LIDEX) 0.05 % external solution Apply topically.     fluticasone (FLONASE) 50 MCG/ACT  nasal spray Place into both nostrils.     furosemide (LASIX) 20 MG tablet Take 20 mg by mouth as needed for fluid or edema.     HUMALOG KWIKPEN 100 UNIT/ML KwikPen Inject into the skin.     isosorbide mononitrate (IMDUR) 30 MG 24 hr tablet Take 15 mg by mouth daily.     ketoconazole (NIZORAL) 2 % cream Apply 1 Application topically 2 (two) times daily as needed.     magnesium oxide (MAG-OX) 400 MG tablet Take 1 tablet (400 mg total) by mouth in the morning, at noon, and at bedtime. 270 tablet 3   mesalamine (LIALDA) 1.2 g EC tablet Take 1.2 g by mouth daily.      metoprolol succinate (TOPROL-XL) 25 MG 24 hr tablet TAKE 1 TABLET(25 MG) BY MOUTH DAILY 90 tablet 3   Multiple Vitamin (MULTIVITAMIN) tablet Take 1 tablet by mouth daily. Pack Heart healthy     nitroGLYCERIN (NITROSTAT) 0.4 MG SL tablet Place 1 tablet (0.4 mg total) under the tongue every 5 (five) minutes as needed. 25 tablet 2   ramipril (ALTACE) 2.5 MG capsule TAKE 1 CAPSULE(2.5 MG) BY MOUTH DAILY 90 capsule 3   rivaroxaban (XARELTO) 20 MG TABS tablet Take 1 tablet (20 mg total) by mouth daily. 90 tablet 0   Semaglutide (OZEMPIC, 0.25 OR 0.5 MG/DOSE, [place]) Inject 0.5 mg into the skin once a  week.     TOUJEO SOLOSTAR 300 UNIT/ML Solostar Pen Inject 20 Units into the skin in the morning and at bedtime.     TRULICITY 4.5 MG/0.5ML SOPN Inject into the skin once a week.     No current facility-administered medications for this visit.    Allergies:   Orange (diagnostic), Orange fruit [citrus], Empagliflozin, Metformin hcl, Nitrofurantoin, and Bactrim [sulfamethoxazole-trimethoprim]    Social History:  The patient  reports that she has never smoked. She has never used smokeless tobacco. She reports current alcohol use. She reports that she does not use drugs.   Family History:  The patient's ***family history includes Breast cancer (age of onset: 86) in her sister; Cancer in her maternal grandmother and paternal aunt; Diabetes in her father, maternal grandmother, mother, and paternal aunt; Fibromyalgia in her mother; Heart disease in her father; Hypertension in her father, mother, and sister.    ROS:  Please see the history of present illness.   Otherwise, review of systems are positive for ***.   All other systems are reviewed and negative.    PHYSICAL EXAM: VS:  LMP 07/16/2000 (Approximate)  , BMI There is no height or weight on file to calculate BMI. GEN: Well nourished, well developed, in no acute distress HEENT: normal Neck: no JVD, carotid bruits, or masses Cardiac: ***RRR; no murmurs, rubs, or gallops,no edema  Respiratory:  clear to auscultation bilaterally, normal work of breathing GI: soft, nontender, nondistended, + BS MS: no deformity or atrophy Skin: warm and dry, no rash Neuro:  Strength and sensation are intact Psych: euthymic mood, full affect   EKG:   The ekg ordered today demonstrates ***   Recent Labs: 05/21/2022: ALT 24; Magnesium 1.8 08/29/2022: BUN 21; Creatinine, Ser 0.92; Hemoglobin 12.5; Platelets 175; Potassium 4.2; Sodium 137   Lipid Panel    Component Value Date/Time   CHOL 133 02/23/2022 1232   TRIG 104 02/23/2022 1232   HDL 68 02/23/2022  1232   CHOLHDL 2.0 02/23/2022 1232   CHOLHDL 4 05/17/2014 1113   VLDL 34.4 05/17/2014 1113   LDLCALC 46  02/23/2022 1232     Other studies Reviewed: Additional studies/ records that were reviewed today with results demonstrating: ***.   ASSESSMENT AND PLAN:  CAD: That is post complex PCI of the RCA including shockwave IVL in 2023./  Hyperlipidemia: Continue lipid-lowering therapy. Hypertension: Avoid excessive salt.  Avoid processed foods. PAF: Anticoagulated: Xarelto for stroke prevention in the setting of PAF. Diabetes: Whole food, plant-based diet.  Exercise target noted below.  We have talked about increasing exercise and adding resistance training to help with blood sugar control and weight loss.   Current medicines are reviewed at length with the patient today.  The patient concerns regarding her medicines were addressed.  The following changes have been made:  No change***  Labs/ tests ordered today include: *** No orders of the defined types were placed in this encounter.   Recommend 150 minutes/week of aerobic exercise Low fat, low carb, high fiber diet recommended  Disposition:   FU in ***   Signed, Lance Muss, MD  03/24/2023 12:18 PM    [facility] [address] [place], [place], [place]  [address] Phone: [phone]; Fax: [phone]

## 2023-03-26 ENCOUNTER — Telehealth: Payer: Self-pay | Admitting: Interventional Cardiology

## 2023-03-26 ENCOUNTER — Encounter: Payer: Self-pay | Admitting: Interventional Cardiology

## 2023-03-26 ENCOUNTER — Ambulatory Visit: Payer: 59 | Attending: Interventional Cardiology | Admitting: Interventional Cardiology

## 2023-03-26 VITALS — BP 128/82 | HR 64 | Ht 60.0 in | Wt 207.0 lb

## 2023-03-26 DIAGNOSIS — I6523 Occlusion and stenosis of bilateral carotid arteries: Secondary | ICD-10-CM

## 2023-03-26 DIAGNOSIS — I480 Paroxysmal atrial fibrillation: Secondary | ICD-10-CM | POA: Diagnosis not present

## 2023-03-26 DIAGNOSIS — Z794 Long term (current) use of insulin: Secondary | ICD-10-CM

## 2023-03-26 DIAGNOSIS — R0609 Other forms of dyspnea: Secondary | ICD-10-CM

## 2023-03-26 DIAGNOSIS — E119 Type 2 diabetes mellitus without complications: Secondary | ICD-10-CM | POA: Diagnosis not present

## 2023-03-26 DIAGNOSIS — I2510 Atherosclerotic heart disease of native coronary artery without angina pectoris: Secondary | ICD-10-CM | POA: Diagnosis not present

## 2023-03-26 DIAGNOSIS — I10 Essential (primary) hypertension: Secondary | ICD-10-CM | POA: Diagnosis not present

## 2023-03-26 DIAGNOSIS — Z955 Presence of coronary angioplasty implant and graft: Secondary | ICD-10-CM

## 2023-03-26 DIAGNOSIS — E782 Mixed hyperlipidemia: Secondary | ICD-10-CM

## 2023-03-26 NOTE — Patient Instructions (Addendum)
Medication Instructions:  Your physician recommends that you continue on your current medications as directed. Please refer to the Current Medication list given to you today.  *If you need a refill on your cardiac medications before your next appointment, please call your pharmacy*   Lab Work: Your physician recommends that you return for lab work in: about 6 months--prior to follow up appointment.  CMET, CBC, Lipids, A1C, BNP  If you have labs (blood work) drawn today and your tests are completely normal, you will receive your results only by: MyChart Message (if you have MyChart) OR A paper copy in the mail If you have any lab test that is abnormal or we need to change your treatment, we will call you to review the results.   Testing/Procedures: Your physician has requested that you have a carotid duplex. This test is an ultrasound of the carotid arteries in your neck. It looks at blood flow through these arteries that supply the brain with blood. Allow one hour for this exam. There are no restrictions or special instructions. To be done in February 2025     Follow-Up: At [facility], you and your health needs are our priority.  As part of our continuing mission to provide you with exceptional heart care, we have created designated Provider Care Teams.  These Care Teams include your primary Cardiologist (physician) and Advanced Practice Providers (APPs -  Physician Assistants and Nurse Practitioners) who all work together to provide you with the care you need, when you need it.  We recommend signing up for the patient portal called "MyChart".  Sign up information is provided on this After Visit Summary.  MyChart is used to connect with patients for Virtual Visits (Telemedicine).  Patients are able to view lab/test results, encounter notes, upcoming appointments, etc.  Non-urgent messages can be sent to your provider as well.   To learn more about what you can do with MyChart, go  to [email or web address].    Your next appointment:   6 month(s)  Provider:   Dr Clifton James, Dr Lynnette Caffey, Dr Herbie Baltimore    Other Instructions

## 2023-03-26 NOTE — Addendum Note (Signed)
Addended by: Dossie Arbour on: 03/26/2023 10:13 AM   Modules accepted: Orders

## 2023-03-26 NOTE — Telephone Encounter (Signed)
I spoke with patient. She is asking if carotid doppler can be done in December.

## 2023-03-26 NOTE — Telephone Encounter (Signed)
Patient is requesting call back from Dr. Hoyle Barr nurse, Dennie Bible, in regards to insurance information and upcoming appts. Please advise.

## 2023-03-26 NOTE — Addendum Note (Signed)
Addended by: Dossie Arbour on: 03/26/2023 10:27 AM   Modules accepted: Orders

## 2023-03-28 NOTE — Telephone Encounter (Signed)
Per precert department carotid doppler can be done in December.  Patient notified

## 2023-05-17 ENCOUNTER — Telehealth: Payer: Self-pay | Admitting: Physician Assistant

## 2023-05-17 DIAGNOSIS — I480 Paroxysmal atrial fibrillation: Secondary | ICD-10-CM

## 2023-05-17 MED ORDER — MAGNESIUM OXIDE 400 MG PO TABS: 400.0000 mg | ORAL_TABLET | Freq: Three times a day (TID) | ORAL | 3 refills | Status: AC

## 2023-05-17 MED ORDER — EZETIMIBE 10 MG PO TABS: 10.0000 mg | ORAL_TABLET | Freq: Every day | ORAL | 3 refills | Status: DC

## 2023-05-17 MED ORDER — CLOPIDOGREL BISULFATE 75 MG PO TABS: 75.0000 mg | ORAL_TABLET | Freq: Every day | ORAL | 3 refills | Status: DC

## 2023-05-17 MED ORDER — RIVAROXABAN 20 MG PO TABS: 20.0000 mg | ORAL_TABLET | Freq: Every day | ORAL | 3 refills | Status: DC

## 2023-05-17 MED ORDER — METOPROLOL SUCCINATE ER 25 MG PO TB24: 25.0000 mg | ORAL_TABLET | Freq: Every day | ORAL | 3 refills | Status: DC

## 2023-05-17 NOTE — Telephone Encounter (Signed)
*  STAT* If patient is at the pharmacy, call can be transferred to refill team.   1. Which medications need to be refilled? (please list name of each medication and dose if known)   rivaroxaban (XARELTO) 20 MG TABS tablet    clopidogrel (PLAVIX) 75 MG tablet    metoprolol succinate (TOPROL-XL) 25 MG 24 hr tablet    magnesium oxide (MAG-OX) 400 MG tablet    ezetimibe (ZETIA) 10 MG tablet   2. Which pharmacy/location (including street and city if local pharmacy) is medication to be sent to? [id number] - [place], [place] - [address]    3. Do they need a 30 day or 90 day supply? 90 day

## 2023-06-05 ENCOUNTER — Telehealth: Payer: Self-pay | Admitting: *Deleted

## 2023-06-05 NOTE — Telephone Encounter (Signed)
   Pre-operative Risk Assessment    Patient Name: Courtney Olson  DOB: [birth year] MRN: [id number]  DATE OF LAST VISIT: 03/26/23 DR. VARANASI DATE OF NEXT VISIT: 09/23/23 DR. [facility]    Request for Surgical Clearance    Procedure:   COLONOSCOPY  (ULCERATIVE PAN COLITIS)  Date of Surgery:  Clearance 06/19/23                                 Surgeon:  DR. Bosie Clos Surgeon's Group or Practice Name:  [facility] Phone number:  [phone] Fax number:  [phone]   Type of Clearance Requested:   - Medical  - Pharmacy:  Hold Clopidogrel (Plavix) and Rivaroxaban (Xarelto) PER CLEARANCE FORM TO HOLD PLAVIX x 5 DAYS AND HOLD TIME FOR XARELTO x 2 DAYS PRIOR   Type of Anesthesia:   PROPOFOL   Additional requests/questions:    Elpidio Anis   06/05/2023, 2:05 PM

## 2023-06-06 NOTE — Telephone Encounter (Signed)
   Name: Courtney Olson  DOB: 1965-08-26  MRN: [id number]   Primary Cardiologist: Lance Muss, MD Procedure: Colonoscopy  Chart reviewed as part of pre-operative protocol coverage. Patient was contacted 06/06/2023 in reference to pre-operative risk assessment for pending surgery as outlined below.  Courtney Olson was last seen on 03/26/23 by Dr. Eldridge Dace. Since that day, Courtney Olson has done well, she has remained stable from a cardiac standpoint. She denies chest pain, palpitations, dyspnea, pnd, orthopnea, n, v, dizziness, syncope, edema, weight gain, or early satiety. She is able to meet greater than 4 METs of activity without anginal symptoms. Therefore, based on ACC/AHA guidelines, the patient would be at acceptable risk for the planned procedure without further cardiovascular testing.   The patient was advised that if she develops new symptoms prior to surgery to contact our office to arrange for a follow-up visit, and she verbalized understanding.  Per office protocol and PharmD, patient can hold Xarelto for 2 days prior to procedure.  Per Dr. Hoyle Barr note patient can hold Plavix for five days prior to procedure.  Please resume Xarelto and Plavix as soon as possible, once safe to do so from a bleeding standpoint.  I will route this recommendation to the requesting party via Epic fax function and remove from pre-op pool. Please call with questions.  Rip Harbour, NP 06/06/2023, 5:11 PM

## 2023-06-06 NOTE — Telephone Encounter (Signed)
Patient with diagnosis of afib on Xarelto for anticoagulation.    Procedure: COLONOSCOPY  (ULCERATIVE PAN COLITIS)  Date of procedure: 06/19/23   CHA2DS2-VASc Score = 4   This indicates a 4.8% annual risk of stroke. The patient's score is based upon: CHF History: 0 HTN History: 1 Diabetes History: 1 Stroke History: 0 Vascular Disease History: 1 Age Score: 0 Gender Score: 1      CrCl 72 ml/min Platelet count 199  Per office protocol, patient can hold Xarelto for 2 days prior to procedure.    **This guidance is not considered finalized until pre-operative APP has relayed final recommendations.**

## 2023-06-06 NOTE — Telephone Encounter (Signed)
Chart reviewed as part of pre-operative protocol coverage.  Called patient to discuss request for surgical clearance for colonoscopy on 06/19/2023, patient unable to answer, left message per DPR. On review of Dr. Hoyle Barr note on 03/26/23 patient can hold Plavix for five days prior to procedure.   Rip Harbour, NP 06/06/2023, 12:25 PM

## 2023-07-11 ENCOUNTER — Ambulatory Visit (HOSPITAL_COMMUNITY)
Admission: RE | Admit: 2023-07-11 | Discharge: 2023-07-11 | Disposition: A | Discharge status: Home / Self Care | Payer: 59 | Source: Ambulatory Visit | Attending: Interventional Cardiology | Admitting: Interventional Cardiology

## 2023-07-11 DIAGNOSIS — I6523 Occlusion and stenosis of bilateral carotid arteries: Secondary | ICD-10-CM

## 2023-07-15 ENCOUNTER — Other Ambulatory Visit: Payer: Self-pay | Admitting: Family Medicine

## 2023-07-15 DIAGNOSIS — Z1231 Encounter for screening mammogram for malignant neoplasm of breast: Secondary | ICD-10-CM

## 2023-07-16 ENCOUNTER — Ambulatory Visit
Admission: RE | Admit: 2023-07-16 | Discharge: 2023-07-16 | Disposition: A | Discharge status: Home / Self Care | Payer: 59 | Source: Ambulatory Visit | Attending: Family Medicine | Admitting: Family Medicine

## 2023-07-16 DIAGNOSIS — Z1231 Encounter for screening mammogram for malignant neoplasm of breast: Secondary | ICD-10-CM

## 2023-08-13 ENCOUNTER — Telehealth: Payer: Self-pay | Admitting: Internal Medicine

## 2023-08-13 MED ORDER — RAMIPRIL 2.5 MG PO CAPS: 2.5000 mg | ORAL_CAPSULE | Freq: Every day | ORAL | 0 refills | Status: DC

## 2023-08-13 NOTE — Telephone Encounter (Signed)
Pt's medication was sent to pt's pharmacy as requested. Confirmation received.

## 2023-08-13 NOTE — Telephone Encounter (Signed)
*  STAT* If patient is at the pharmacy, call can be transferred to refill team.   1. Which medications need to be refilled? (please list name of each medication and dose if known) ramipril (ALTACE) 2.5 MG capsule   2. Which pharmacy/location (including street and city if local pharmacy) is medication to be sent to?  [id number] - [place], [place] - [address] [phone]   Mark as Reviewed  3. Do they need a 30 day or 90 day supply? 90

## 2023-08-27 ENCOUNTER — Encounter (HOSPITAL_COMMUNITY): Payer: 59

## 2023-09-16 NOTE — Progress Notes (Deleted)
 Cardiology Office Note:   Date:  09/16/2023  ID:  Courtney Olson, DOB [birth year], MRN [id number] PCP:  Joycelyn Rua, MD  [facility] Providers Cardiologist:  Alverda Skeans, MD Referring MD: Joycelyn Rua, MD  Chief Complaint/Reason for Referral: Follow-up for coronary artery disease ASSESSMENT:    1. Coronary artery disease involving native coronary artery of native heart without angina pectoris   2. Type 2 diabetes mellitus with complication, with long-term current use of insulin (HCC)   3. Hypertension associated with diabetes (HCC)   4. Hyperlipidemia associated with type 2 diabetes mellitus (HCC)   5. CKD stage 2 due to type 2 diabetes mellitus (HCC)   6. Aortic atherosclerosis (HCC)   7. PAF (paroxysmal atrial fibrillation) (HCC)   8. Secondary hypercoagulable state (HCC)   9. BMI 40.0-44.9, adult [facility])     PLAN:   In order of problems listed above: Coronary artery disease: Will stop Plavix and continue indefinite Xarelto monotherapy 20 mg daily.  Continue atorvastatin 40 mg, Toprol 25 mg daily, and as needed nitroglycerin. Type 2 diabetes mellitus: Continue Xarelto 20 mg instead of aspirin, ramipril 2.5 mg, atorvastatin 40 mg, and start Jardiance 10 mg daily.*** Hypertension: Continue Toprol 25 mg daily and ramipril 2.5 mg daily.*** Hyperlipidemia: Check lipid panel, LFTs, and LP(a).*** CKD stage II: Continue ramipril 2.5 mg and start Jardiance 10 mg daily for renal protection.*** Aortic atherosclerosis: Continue Xarelto 20 mg daily, atorvastatin 40 mg daily, and strict blood pressure control. Paroxysmal atrial fibrillation: Continue Xarelto 20 mg daily and Toprol 25 mg daily. Secondary hypercoagulable state: Xarelto 20 mg daily. Elevated BMI: Continue Ozempic.        {Are you ordering a CV Procedure (e.g. stress test, cath, DCCV, TEE, etc)?   Press F2        :[id number]}   Dispo:  No follow-ups on file.      Medication Adjustments/Labs and Tests  Ordered: Current medicines are reviewed at length with the patient today.  Concerns regarding medicines are outlined above.  The following changes have been made:  {PLAN; NO CHANGE:13088:s}   Labs/tests ordered: No orders of the defined types were placed in this encounter.   Medication Changes: No orders of the defined types were placed in this encounter.   Current medicines are reviewed at length with the patient today.  The patient {ACTIONS; HAS/DOES NOT HAVE:19233} concerns regarding medicines.  I spent *** minutes reviewing all clinical data during and prior to this visit including all relevant imaging studies, laboratories, clinical information from other health systems and prior notes from both Cardiology and other specialties, interviewing the patient, conducting a complete physical examination, and coordinating care in order to formulate a comprehensive and personalized evaluation and treatment plan.   History of Present Illness:      FOCUSED PROBLEM LIST:   CAD DES mRCA 2020 DES RCA 2022 due to distal edge ISR DES mid RCA proximal to previously stented region plus shockwave lithotripsy of ISR and previously stented region Hypertension Hyperlipidemia Aortic atherosclerosis Chest CT 2024 Carotid artery disease 1 to 39% R ICA, 40 to 59% LICA carotid ultrasound 2024 PAF CV 2 score 4 On Eliquis T2DM On insulin CKD stage II BMI 40 Incomplete right bundle branch block  March 2025:  Patient consents to use of AI scribe.  The patient returns for routine follow-up.  She was last seen in September 2024.  At that point in time she was doing well.  She remained on Xarelto and Plavix at that  time.          Current Medications: No outpatient medications have been marked as taking for the 09/23/23 encounter (Appointment) with Orbie Pyo, MD.     Review of Systems:   Please see the history of present illness.    All other systems reviewed and are negative.      EKGs/Labs/Other Test Reviewed:   EKG: EKG from February 2024 demonstrates sinus rhythm with incomplete right bundle branch block  EKG Interpretation Date/Time:    Ventricular Rate:    PR Interval:    QRS Duration:    QT Interval:    QTC Calculation:   R Axis:      Text Interpretation:           Risk Assessment/Calculations:    CHA2DS2-VASc Score = 4  {Confirm score is correct.  If not, click here to update score.  REFRESH note.  :1} This indicates a 4.8% annual risk of stroke. The patient's score is based upon: CHF History: 0 HTN History: 1 Diabetes History: 1 Stroke History: 0 Vascular Disease History: 1 Age Score: 0 Gender Score: 1   {This patient has a significant risk of stroke if diagnosed with atrial fibrillation.  Please consider VKA or DOAC agent for anticoagulation if the bleeding risk is acceptable.   You can also use the SmartPhrase .[email or web address] for documentation.   :[id number]}      Physical Exam:   VS:  LMP 07/16/2000 (Approximate)    No BP recorded.  {Refresh Note OR Click here to enter BP  :1}***   Wt Readings from Last 3 Encounters:  03/26/23 207 lb (93.9 kg)  08/31/22 210 lb (95.3 kg)  08/29/22 203 lb 0.7 oz (92.1 kg)      GENERAL:  No apparent distress, AOx3 HEENT:  No carotid bruits, +2 carotid impulses, no scleral icterus CAR: RRR Irregular RR*** no murmurs***, gallops, rubs, or thrills RES:  Clear to auscultation bilaterally ABD:  Soft, nontender, nondistended, positive bowel sounds x 4 VASC:  +2 radial pulses, +2 carotid pulses NEURO:  CN 2-12 grossly intact; motor and sensory grossly intact PSYCH:  No active depression or anxiety EXT:  No edema, ecchymosis, or cyanosis  Signed, Orbie Pyo, MD  09/16/2023 9:36 AM    [facility] [address] [place], [place], [place]  [address] Phone: [phone]; Fax: [phone]   Note:  This document was prepared using Dragon voice recognition software and may include  unintentional dictation errors.

## 2023-09-18 ENCOUNTER — Other Ambulatory Visit: Payer: Self-pay | Admitting: Internal Medicine

## 2023-09-18 ENCOUNTER — Other Ambulatory Visit: Payer: Self-pay | Admitting: Nurse Practitioner

## 2023-09-18 DIAGNOSIS — N952 Postmenopausal atrophic vaginitis: Secondary | ICD-10-CM

## 2023-09-18 DIAGNOSIS — N941 Unspecified dyspareunia: Secondary | ICD-10-CM

## 2023-09-18 NOTE — Telephone Encounter (Signed)
 Med refill request: estradiol 0.1 mg vaginal cream Last AEX: 05/30/22 Next AEX: none scheduled Last MMG (if hormonal med) 07/16/23 BI-RADS 1 negative Refill denied, needs appointment.  Sent to provider for review.

## 2023-09-19 ENCOUNTER — Ambulatory Visit: Payer: Self-pay | Attending: Interventional Cardiology

## 2023-09-19 DIAGNOSIS — E119 Type 2 diabetes mellitus without complications: Secondary | ICD-10-CM

## 2023-09-19 DIAGNOSIS — I2510 Atherosclerotic heart disease of native coronary artery without angina pectoris: Secondary | ICD-10-CM

## 2023-09-19 DIAGNOSIS — R0609 Other forms of dyspnea: Secondary | ICD-10-CM

## 2023-09-19 DIAGNOSIS — I10 Essential (primary) hypertension: Secondary | ICD-10-CM

## 2023-09-19 DIAGNOSIS — E782 Mixed hyperlipidemia: Secondary | ICD-10-CM

## 2023-09-19 DIAGNOSIS — Z955 Presence of coronary angioplasty implant and graft: Secondary | ICD-10-CM

## 2023-09-19 DIAGNOSIS — I480 Paroxysmal atrial fibrillation: Secondary | ICD-10-CM

## 2023-09-23 ENCOUNTER — Other Ambulatory Visit: Payer: Self-pay | Admitting: *Deleted

## 2023-09-23 ENCOUNTER — Telehealth: Payer: Self-pay | Admitting: Physician Assistant

## 2023-09-23 ENCOUNTER — Ambulatory Visit: Payer: Self-pay | Admitting: Internal Medicine

## 2023-09-23 DIAGNOSIS — E119 Type 2 diabetes mellitus without complications: Secondary | ICD-10-CM

## 2023-09-23 DIAGNOSIS — I700 Atherosclerosis of aorta: Secondary | ICD-10-CM

## 2023-09-23 DIAGNOSIS — I2510 Atherosclerotic heart disease of native coronary artery without angina pectoris: Secondary | ICD-10-CM

## 2023-09-23 DIAGNOSIS — E1122 Type 2 diabetes mellitus with diabetic chronic kidney disease: Secondary | ICD-10-CM

## 2023-09-23 DIAGNOSIS — I480 Paroxysmal atrial fibrillation: Secondary | ICD-10-CM

## 2023-09-23 DIAGNOSIS — E7800 Pure hypercholesterolemia, unspecified: Secondary | ICD-10-CM

## 2023-09-23 DIAGNOSIS — E785 Hyperlipidemia, unspecified: Secondary | ICD-10-CM

## 2023-09-23 DIAGNOSIS — D6869 Other thrombophilia: Secondary | ICD-10-CM

## 2023-09-23 DIAGNOSIS — E782 Mixed hyperlipidemia: Secondary | ICD-10-CM

## 2023-09-23 DIAGNOSIS — E118 Type 2 diabetes mellitus with unspecified complications: Secondary | ICD-10-CM

## 2023-09-23 DIAGNOSIS — Z79899 Other long term (current) drug therapy: Secondary | ICD-10-CM

## 2023-09-23 DIAGNOSIS — E1159 Type 2 diabetes mellitus with other circulatory complications: Secondary | ICD-10-CM

## 2023-09-23 DIAGNOSIS — I10 Essential (primary) hypertension: Secondary | ICD-10-CM

## 2023-09-23 DIAGNOSIS — Z6841 Body Mass Index (BMI) 40.0 and over, adult: Secondary | ICD-10-CM

## 2023-09-23 NOTE — Telephone Encounter (Signed)
 Pt aware new lab orders entered .Courtney Olson

## 2023-09-23 NOTE — Telephone Encounter (Signed)
 Pt said her lab orders have expired and she wants them updated so she can go have them done.

## 2023-10-03 LAB — LIPID PANEL

## 2023-10-04 LAB — LIPID PANEL
Cholesterol, Total: 133 mg/dL (ref 100–199)
HDL: 67 mg/dL (ref >39)
LDL CALC COMMENT:: 2 ratio (ref 0.0–4.4)
LDL Chol Calc (NIH): 46 mg/dL (ref 0–99)
Triglycerides: 110 mg/dL (ref 0–149)
VLDL Cholesterol Cal: 20 mg/dL (ref 5–40)

## 2023-10-04 LAB — COMPREHENSIVE METABOLIC PANEL
ALT: 32 IU/L (ref 0–32)
AST: 31 IU/L (ref 0–40)
Albumin: 4.4 g/dL (ref 3.8–4.9)
Alkaline Phosphatase: 108 IU/L (ref 44–121)
BUN/Creatinine Ratio: 14 (ref 9–23)
BUN: 14 mg/dL (ref 6–24)
Bilirubin Total: 0.9 mg/dL (ref 0.0–1.2)
CO2: 25 mmol/L (ref 20–29)
Calcium: 9.4 mg/dL (ref 8.7–10.2)
Chloride: 97 mmol/L (ref 96–106)
Creatinine, Ser: 1 mg/dL (ref 0.57–1.00)
Globulin, Total: 2.4 g/dL (ref 1.5–4.5)
Glucose: 152 mg/dL — ABNORMAL HIGH (ref 70–99)
Potassium: 4.5 mmol/L (ref 3.5–5.2)
Sodium: 136 mmol/L (ref 134–144)
Total Protein: 6.8 g/dL (ref 6.0–8.5)
eGFR: 66 mL/min/{1.73_m2} (ref >59)

## 2023-10-04 LAB — CBC
Hematocrit: 41.7 % (ref 34.0–46.6)
Hemoglobin: 13.8 g/dL (ref 11.1–15.9)
MCH: 29.9 pg (ref 26.6–33.0)
MCHC: 33.1 g/dL (ref 31.5–35.7)
MCV: 91 fL (ref 79–97)
Platelets: 159 10*3/uL (ref 150–450)
RBC: 4.61 x10E6/uL (ref 3.77–5.28)
RDW: 14.3 % (ref 11.7–15.4)
WBC: 5.7 10*3/uL (ref 3.4–10.8)

## 2023-10-04 LAB — PRO B NATRIURETIC PEPTIDE: NT-Pro BNP: 237 pg/mL (ref 0–287)

## 2023-10-04 LAB — HEMOGLOBIN A1C
Est. average glucose Bld gHb Est-mCnc: 163 mg/dL
Hgb A1c MFr Bld: 7.3 % — ABNORMAL HIGH (ref 4.8–5.6)

## 2023-11-20 ENCOUNTER — Ambulatory Visit: Payer: Self-pay | Attending: Physician Assistant | Admitting: Physician Assistant

## 2023-11-20 ENCOUNTER — Encounter: Payer: Self-pay | Admitting: Physician Assistant

## 2023-11-20 VITALS — BP 140/74 | HR 70 | Ht 65.0 in | Wt 211.4 lb

## 2023-11-20 DIAGNOSIS — I10 Essential (primary) hypertension: Secondary | ICD-10-CM | POA: Diagnosis not present

## 2023-11-20 DIAGNOSIS — I480 Paroxysmal atrial fibrillation: Secondary | ICD-10-CM

## 2023-11-20 DIAGNOSIS — R011 Cardiac murmur, unspecified: Secondary | ICD-10-CM | POA: Diagnosis not present

## 2023-11-20 DIAGNOSIS — E7800 Pure hypercholesterolemia, unspecified: Secondary | ICD-10-CM

## 2023-11-20 DIAGNOSIS — I6523 Occlusion and stenosis of bilateral carotid arteries: Secondary | ICD-10-CM

## 2023-11-20 DIAGNOSIS — I2510 Atherosclerotic heart disease of native coronary artery without angina pectoris: Secondary | ICD-10-CM | POA: Diagnosis not present

## 2023-11-20 MED ORDER — EZETIMIBE 10 MG PO TABS
10.0000 mg | ORAL_TABLET | Freq: Every day | ORAL | 3 refills | Status: AC
Start: 2023-11-20 — End: ?

## 2023-11-20 MED ORDER — RAMIPRIL 2.5 MG PO CAPS
2.5000 mg | ORAL_CAPSULE | Freq: Every day | ORAL | 3 refills | Status: AC
Start: 2023-11-20 — End: ?

## 2023-11-20 MED ORDER — ISOSORBIDE MONONITRATE ER 30 MG PO TB24: 15.0000 mg | ORAL_TABLET | Freq: Every day | ORAL | 3 refills | Status: DC

## 2023-11-20 MED ORDER — FUROSEMIDE 20 MG PO TABS: 20.0000 mg | ORAL_TABLET | ORAL | 11 refills | Status: AC | PRN

## 2023-11-20 MED ORDER — METOPROLOL SUCCINATE ER 25 MG PO TB24
25.0000 mg | ORAL_TABLET | Freq: Every day | ORAL | 3 refills | Status: AC
Start: 2023-11-20 — End: ?

## 2023-11-20 MED ORDER — RIVAROXABAN 20 MG PO TABS: 20.0000 mg | ORAL_TABLET | Freq: Every day | ORAL | 3 refills | Status: DC

## 2023-11-20 MED ORDER — ATORVASTATIN CALCIUM 40 MG PO TABS: 40.0000 mg | ORAL_TABLET | Freq: Every day | ORAL | 3 refills | Status: AC

## 2023-11-20 MED ORDER — CLOPIDOGREL BISULFATE 75 MG PO TABS: 75.0000 mg | ORAL_TABLET | Freq: Every day | ORAL | 3 refills | Status: DC

## 2023-11-20 NOTE — Addendum Note (Signed)
 Addended byReyne Cave, Geralyn Knee T on: 11/20/2023 05:26 PM   Modules accepted: Orders

## 2023-11-20 NOTE — Assessment & Plan Note (Signed)
 History of DES to the RCA November 2020 and subsequent DES to the RCA in 2022 secondary to in-stent restenosis.  She underwent DES to mid RCA and shockwave lithotripsy to the mid RCA due to in-stent restenosis in August 2023.  She had moderate residual nonobstructive disease elsewhere.  Echocardiogram December 2022 with EF 60-65.  She recently noted some left scapular pain (anginal equivalent) that relieved with nitroglycerin .  She has not had a recurrence since.  She does not quite feel like she has in the past when she had significant obstructive disease.  She is under a lot of stress and has not slept well due to her father-in-law's injury and her fathers dementia.  I suspect anxiety, stress, lack of sleep are the most likely reasons for her symptoms. Given her history, I recommend proceeding with stress testing to rule out significant ischemia. - Order exercise MPI   - Order echocardiogram  - Continue atorvastatin  40 mg daily - Continue clopidogrel  75 mg daily - Continue ezetimibe  10 mg daily - Continue metoprolol  succinate 25 mg daily - Continue nitroglycerin  as needed - Continue ramipril  2.5 mg daily - Follow-up 6 months or sooner if stress testing abnormal or symptoms change

## 2023-11-20 NOTE — Assessment & Plan Note (Signed)
 LDL optimal  - Continue atorvastatin  40 mg daily - Continue ezetimibe  10 mg daily

## 2023-11-20 NOTE — Assessment & Plan Note (Signed)
 Blood pressure borderline elevated, possibly due to stress and anxiety.   - Continue metoprolol  succinate 25 mg daily, ramipril  2.5 mg dail, Imdur  15 mg daily - Monitor blood pressures at home - Contact provider if blood pressures remain greater than 130/80 mmHg

## 2023-11-20 NOTE — Progress Notes (Signed)
 Cardiology Office Note:    Date:  11/20/2023  ID:  Courtney Olson, DOB [birth year], MRN [id number] PCP: Wyn Heater, MD  [facility] Providers Cardiologist:  None Cardiology APP:  Gabino Joe, PA-C       Patient Profile:      Coronary artery disease  S/p DES to RCA in 05/2019 S/p DES to RCA in 06/2021 due to ISR  TTE 07/07/2021: EF 60-65, no RWMA, normal RVSF, mild AV calcification, AV sclerosis S/p 3.5 x 12 mm Synergy DES to [facility] and shockwave lithotripsy to mRCA ISR in 02/2022 LHC 03/02/2022: D1 60, D2 60; LCx ostial 25; RCA proximal 25, mid 90 (PCI), mid stent 95 ISR (PCI), distal 25 Paroxysmal atrial fibrillation  Rx w Rivaroxaban   Monitor 07/2021: no AFib Carotid artery disease US  07/05/2023: R 1-39; L 40-59 Aortic atherosclerosis  Hypertension  Diabetes mellitus, insulin dependent  Hyperlipidemia  OSA Bradycardia Hx of vasovagal syncope  Hx of pancreatitis  S/p Renal mass excision (benign) Ulcerative colitis  Hx of CMV hepatitis  Rheumatoid arthritis        Discussed the use of AI scribe software for clinical note transcription with the patient, who gave verbal consent to proceed.  History of Present Illness Courtney Olson is a 58 y.o. female who returns for follow-up of CAD, A-fib.  She was last seen by Dr. Jacquelynn Matter in September 2024.  She is here alone. She has been experiencing weakness over the past two days, which is concerning given her history of coronary artery disease and previous blockages. Four weeks ago, she had left scapular pain, which she associates with her angina equivalent. This pain was relieved by nitroglycerin , and she has not had a recurrence since then. Yesterday, she felt lightheaded while cleaning the garage, prompting her to sit down. She has experienced palpitations over the past two nights. She is under significant stress due to family circumstances, including her father-in-law's accident in [place] and her father's dementia  diagnosis, which requires her to travel frequently between [place]  and [place] . Her sleep has been disrupted, contributing to her fatigue and lightheadedness.  She has not had chest heaviness or pressure, shortness of breath.  She reports swelling in her legs when flying or sitting.  She denies smoking and reports a history of long-term residual effects from COVID-19, including a persistent morning cough.     ROS-See HPI    Studies Reviewed:   EKG Interpretation Date/Time:  Wednesday Nov 20 2023 15:38:10 EDT Ventricular Rate:  70 PR Interval:  198 QRS Duration:  92 QT Interval:  404 QTC Calculation: 436 R Axis:   78  Text Interpretation: Normal sinus rhythm Low voltage QRS Incomplete right bundle branch block No significant change since last tracing Confirmed by Marlyse Single [phone]) on 11/20/2023 4:07:08 PM   Results LABS-chart review 10/03/2023: Total cholesterol 133, HDL 67, LDL 46, triglycerides 110, creatinine 1.0, potassium 4.5, ALT 32, hemoglobin 13.9   Risk Assessment/Calculations:    CHA2DS2-VASc Score = 4   This indicates a 4.8% annual risk of stroke. The patient's score is based upon: CHF History: 0 HTN History: 1 Diabetes History: 1 Stroke History: 0 Vascular Disease History: 1 Age Score: 0 Gender Score: 1    HYPERTENSION CONTROL Vitals:   11/20/23 1533 11/20/23 1640  BP: (!) 118/48 (!) 140/74    The patient's blood pressure is elevated above target today.  In order to address the patient's elevated BP: Blood pressure will be monitored at home  to determine if medication changes need to be made.          Physical Exam:   VS:  BP (!) 140/74   Pulse 70   Ht 5\' 5"  (1.651 m)   Wt 211 lb 6.4 oz (95.9 kg)   LMP 07/16/2000 (Approximate)   SpO2 96%   BMI 35.18 kg/m    Wt Readings from Last 3 Encounters:  11/20/23 211 lb 6.4 oz (95.9 kg)  03/26/23 207 lb (93.9 kg)  08/31/22 210 lb (95.3 kg)    Constitutional:      Appearance: Healthy appearance. Not  in distress.  Neck:     Vascular: JVD normal.  Pulmonary:     Breath sounds: Normal breath sounds. No wheezing. No rales.  Cardiovascular:     Normal rate. Regular rhythm.     Murmurs: There is a grade 2/6 harsh systolic murmur at the URSB.  Edema:    Peripheral edema absent.  Abdominal:     Palpations: Abdomen is soft.        Assessment and Plan:   Assessment & Plan Coronary artery disease involving native coronary artery of native heart without angina pectoris History of DES to the RCA November 2020 and subsequent DES to the RCA in 2022 secondary to in-stent restenosis.  She underwent DES to mid RCA and shockwave lithotripsy to the mid RCA due to in-stent restenosis in August 2023.  She had moderate residual nonobstructive disease elsewhere.  Echocardiogram December 2022 with EF 60-65.  She recently noted some left scapular pain (anginal equivalent) that relieved with nitroglycerin .  She has not had a recurrence since.  She does not quite feel like she has in the past when she had significant obstructive disease.  She is under a lot of stress and has not slept well due to her father-in-law's injury and her fathers dementia.  I suspect anxiety, stress, lack of sleep are the most likely reasons for her symptoms. Given her history, I recommend proceeding with stress testing to rule out significant ischemia. - Order exercise MPI   - Order echocardiogram  - Continue atorvastatin  40 mg daily - Continue clopidogrel  75 mg daily - Continue ezetimibe  10 mg daily - Continue metoprolol  succinate 25 mg daily - Continue nitroglycerin  as needed - Continue ramipril  2.5 mg daily - Follow-up 6 months or sooner if stress testing abnormal or symptoms change Paroxysmal atrial fibrillation (HCC) Maintaining sinus rhythm with no recent episodes.  Recent hemoglobin, creatinine normal. - Continue rivaroxaban  20 mg daily Murmur Aortic valve calcification noted on echocardiogram in December 2022. I will  repeat an echocardiogram to assess for aortic stenosis. - Order 2D Echocardiogram  Primary hypertension Blood pressure borderline elevated, possibly due to stress and anxiety.   - Continue metoprolol  succinate 25 mg daily, ramipril  2.5 mg dail, Imdur  15 mg daily - Monitor blood pressures at home - Contact provider if blood pressures remain greater than 130/80 mmHg Pure hypercholesterolemia LDL optimal  - Continue atorvastatin  40 mg daily - Continue ezetimibe  10 mg daily Bilateral carotid artery stenosis 40-59% stenosis on the left carotid artery as of December 2024. Follow-up Doppler due in December 2025. -Order Carotid US    Informed Consent   Shared Decision Making/Informed Consent The risks [chest pain, shortness of breath, cardiac arrhythmias, dizziness, blood pressure fluctuations, myocardial infarction, stroke/transient ischemic attack, nausea, vomiting, allergic reaction, radiation exposure, metallic taste sensation and life-threatening complications (estimated to be 1 in 10,000)], benefits (risk stratification, diagnosing coronary artery disease, treatment guidance) and  alternatives of a nuclear stress test were discussed in detail with Courtney Olson and she agrees to proceed.     Dispo:  Return in about 6 months (around 05/22/2024) for Routine Follow Up, w/ Dr. Lorie Rook, or Marlyse Single, PA-C.  Signed, Marlyse Single, PA-C

## 2023-11-20 NOTE — Patient Instructions (Signed)
 Medication Instructions:  Your physician recommends that you continue on your current medications as directed. Please refer to the Current Medication list given to you today.  *If you need a refill on your cardiac medications before your next appointment, please call your pharmacy*  Lab Work: None ordered  If you have labs (blood work) drawn today and your tests are completely normal, you will receive your results only by: MyChart Message (if you have MyChart) OR A paper copy in the mail If you have any lab test that is abnormal or we need to change your treatment, we will call you to review the results.  Testing/Procedures: Your physician has requested that you have an echocardiogram. Echocardiography is a painless test that uses sound waves to create images of your heart. It provides your doctor with information about the size and shape of your heart and how well your heart's chambers and valves are working. This procedure takes approximately one hour. There are no restrictions for this procedure. Please do NOT wear cologne, perfume, aftershave, or lotions (deodorant is allowed). Please arrive 15 minutes prior to your appointment time.  Please note: We ask at that you not bring children with you during ultrasound (echo/ vascular) testing. Due to room size and safety concerns, children are not allowed in the ultrasound rooms during exams. Our front office staff cannot provide observation of children in our lobby area while testing is being conducted. An adult accompanying a patient to their appointment will only be allowed in the ultrasound room at the discretion of the ultrasound technician under special circumstances. We apologize for any inconvenience.   Your physician has requested that you have a carotid duplex IN DECEMBER 2025. This test is an ultrasound of the carotid arteries in your neck. It looks at blood flow through these arteries that supply the brain with blood. Allow one hour for  this exam. There are no restrictions or special instructions.   Your physician has requested that you have en exercise stress myoview . For further information please visit [email or web address] Please follow instruction sheet, BELOW:  - DO NOT TAKE YOUR METOPROLOL  MEDICATION THE MORNING OF THE STRESS TEST.  BRING IT WITH YOU TO YOUR APPOINTMENT.  - DO NOT EAT, DRINK, OR USE TOBACCO PRODUCTS WITHIN 4 HOURS OF THE TEST - DRESS PREPARED TO EXERCISE IN A COMFORTABLE, 2 PIECE CLOTHING OUTFIT AND WALKING SHOES - BRING ANY PRESCRIPTION MEDICATIONS WITH YOU  - NOTIFY THE OFFICE 24 HOURS IN ADVANCE IF YOU NEED TO CANCEL - CALL THE OFFICE [phone] IF YOU HAVE ANY QUESTIONS    Follow-Up: At [facility], you and your health needs are our priority.  As part of our continuing mission to provide you with exceptional heart care, our providers are all part of one team.  This team includes your primary Cardiologist (physician) and Advanced Practice Providers or APPs (Physician Assistants and Nurse Practitioners) who all work together to provide you with the care you need, when you need it.  Your next appointment:   6 month(s)  Provider:   Alyssa Backbone, MD or Marlyse Single, PA-C          We recommend signing up for the patient portal called "MyChart".  Sign up information is provided on this After Visit Summary.  MyChart is used to connect with patients for Virtual Visits (Telemedicine).  Patients are able to view lab/test results, encounter notes, upcoming appointments, etc.  Non-urgent messages can be sent to your provider as well.   To  learn more about what you can do with MyChart, go to [email or web address].   Other Instructions

## 2023-11-21 ENCOUNTER — Telehealth: Payer: Self-pay | Admitting: *Deleted

## 2023-11-21 NOTE — Telephone Encounter (Signed)
 Left detailed instructions for MPI study.

## 2023-11-25 ENCOUNTER — Telehealth: Payer: Self-pay | Admitting: Cardiology

## 2023-11-25 ENCOUNTER — Emergency Department (HOSPITAL_COMMUNITY)

## 2023-11-25 ENCOUNTER — Encounter (HOSPITAL_COMMUNITY): Payer: Self-pay | Admitting: *Deleted

## 2023-11-25 ENCOUNTER — Observation Stay (HOSPITAL_COMMUNITY)
Admission: EM | Admit: 2023-11-25 | Discharge: 2023-11-27 | Disposition: A | Discharge status: Home / Self Care | Attending: Internal Medicine | Admitting: Internal Medicine

## 2023-11-25 ENCOUNTER — Other Ambulatory Visit: Payer: Self-pay

## 2023-11-25 DIAGNOSIS — I25110 Atherosclerotic heart disease of native coronary artery with unstable angina pectoris: Secondary | ICD-10-CM | POA: Diagnosis not present

## 2023-11-25 DIAGNOSIS — N182 Chronic kidney disease, stage 2 (mild): Secondary | ICD-10-CM | POA: Insufficient documentation

## 2023-11-25 DIAGNOSIS — R0602 Shortness of breath: Secondary | ICD-10-CM | POA: Diagnosis present

## 2023-11-25 DIAGNOSIS — Z79899 Other long term (current) drug therapy: Secondary | ICD-10-CM | POA: Insufficient documentation

## 2023-11-25 DIAGNOSIS — E7800 Pure hypercholesterolemia, unspecified: Secondary | ICD-10-CM | POA: Diagnosis not present

## 2023-11-25 DIAGNOSIS — E1122 Type 2 diabetes mellitus with diabetic chronic kidney disease: Secondary | ICD-10-CM | POA: Diagnosis not present

## 2023-11-25 DIAGNOSIS — I509 Heart failure, unspecified: Secondary | ICD-10-CM | POA: Diagnosis not present

## 2023-11-25 DIAGNOSIS — Z7902 Long term (current) use of antithrombotics/antiplatelets: Secondary | ICD-10-CM | POA: Insufficient documentation

## 2023-11-25 DIAGNOSIS — I200 Unstable angina: Principal | ICD-10-CM

## 2023-11-25 DIAGNOSIS — I480 Paroxysmal atrial fibrillation: Secondary | ICD-10-CM | POA: Diagnosis not present

## 2023-11-25 DIAGNOSIS — Z955 Presence of coronary angioplasty implant and graft: Secondary | ICD-10-CM | POA: Diagnosis not present

## 2023-11-25 DIAGNOSIS — I130 Hypertensive heart and chronic kidney disease with heart failure and stage 1 through stage 4 chronic kidney disease, or unspecified chronic kidney disease: Secondary | ICD-10-CM | POA: Insufficient documentation

## 2023-11-25 DIAGNOSIS — I10 Essential (primary) hypertension: Secondary | ICD-10-CM | POA: Diagnosis not present

## 2023-11-25 DIAGNOSIS — E785 Hyperlipidemia, unspecified: Secondary | ICD-10-CM | POA: Diagnosis not present

## 2023-11-25 DIAGNOSIS — E119 Type 2 diabetes mellitus without complications: Secondary | ICD-10-CM

## 2023-11-25 LAB — BASIC METABOLIC PANEL WITH GFR
Anion gap: 12 (ref 5–15)
BUN: 34 mg/dL — ABNORMAL HIGH (ref 6–20)
CO2: 26 mmol/L (ref 22–32)
Calcium: 9.9 mg/dL (ref 8.9–10.3)
Chloride: 101 mmol/L (ref 98–111)
Creatinine, Ser: 1.06 mg/dL — ABNORMAL HIGH (ref 0.44–1.00)
GFR, Estimated: >60 mL/min (ref >60)
Glucose, Bld: 86 mg/dL (ref 70–99)
Potassium: 4 mmol/L (ref 3.5–5.1)
Sodium: 139 mmol/L (ref 135–145)

## 2023-11-25 LAB — HEPATIC FUNCTION PANEL
ALT: 39 U/L (ref 0–44)
AST: 34 U/L (ref 15–41)
Albumin: 4.2 g/dL (ref 3.5–5.0)
Alkaline Phosphatase: 75 U/L (ref 38–126)
Bilirubin, Direct: 0.2 mg/dL (ref 0.0–0.2)
Indirect Bilirubin: 0.5 mg/dL (ref 0.3–0.9)
Total Bilirubin: 0.7 mg/dL (ref 0.0–1.2)
Total Protein: 8 g/dL (ref 6.5–8.1)

## 2023-11-25 LAB — CBC
HCT: 41.1 % (ref 36.0–46.0)
Hemoglobin: 13.5 g/dL (ref 12.0–15.0)
MCH: 30.1 pg (ref 26.0–34.0)
MCHC: 32.8 g/dL (ref 30.0–36.0)
MCV: 91.5 fL (ref 80.0–100.0)
Platelets: 209 10*3/uL (ref 150–400)
RBC: 4.49 MIL/uL (ref 3.87–5.11)
RDW: 14.9 % (ref 11.5–15.5)
WBC: 6.6 10*3/uL (ref 4.0–10.5)
nRBC: 0 % (ref 0.0–0.2)

## 2023-11-25 LAB — LIPID PANEL
Cholesterol: 133 mg/dL (ref 0–200)
HDL: 65 mg/dL (ref >40)
LDL Cholesterol: 49 mg/dL (ref 0–99)
Total CHOL/HDL Ratio: 2 ratio
Triglycerides: 97 mg/dL (ref <150)
VLDL: 19 mg/dL (ref 0–40)

## 2023-11-25 LAB — HEMOGLOBIN A1C
Hgb A1c MFr Bld: 6.5 % — ABNORMAL HIGH (ref 4.8–5.6)
Mean Plasma Glucose: 140 mg/dL

## 2023-11-25 LAB — TROPONIN I (HIGH SENSITIVITY)
Troponin I (High Sensitivity): 7 ng/L (ref <18)
Troponin I (High Sensitivity): 10 ng/L (ref <18)

## 2023-11-25 LAB — TSH: TSH: 4.59 u[IU]/mL — ABNORMAL HIGH (ref 0.350–4.500)

## 2023-11-25 LAB — CBG MONITORING, ED
Glucose-Capillary: 156 mg/dL — ABNORMAL HIGH (ref 70–99)
Glucose-Capillary: 201 mg/dL — ABNORMAL HIGH (ref 70–99)

## 2023-11-25 LAB — HIV ANTIBODY (ROUTINE TESTING W REFLEX): HIV Screen 4th Generation wRfx: NONREACTIVE

## 2023-11-25 LAB — LIPASE, BLOOD: Lipase: 48 U/L (ref 11–51)

## 2023-11-25 LAB — BRAIN NATRIURETIC PEPTIDE: B Natriuretic Peptide: 68.8 pg/mL (ref 0.0–100.0)

## 2023-11-25 LAB — HEPARIN LEVEL (UNFRACTIONATED): Heparin Unfractionated: >1.1 [IU]/mL — ABNORMAL HIGH (ref 0.30–0.70)

## 2023-11-25 LAB — APTT: aPTT: 43 s — ABNORMAL HIGH (ref 24–36)

## 2023-11-25 LAB — GLUCOSE, CAPILLARY: Glucose-Capillary: 224 mg/dL — ABNORMAL HIGH (ref 70–99)

## 2023-11-25 MED ORDER — ONDANSETRON HCL 4 MG/2ML IJ SOLN
4.0000 mg | Freq: Four times a day (QID) | INTRAMUSCULAR | Status: DC | PRN
  Administered 2023-11-26: 4 mg via INTRAVENOUS
  Filled 2023-11-25: qty 2

## 2023-11-25 MED ORDER — NITROGLYCERIN 0.4 MG SL SUBL: 0.4000 mg | SUBLINGUAL_TABLET | SUBLINGUAL | Status: DC | PRN

## 2023-11-25 MED ORDER — ISOSORBIDE MONONITRATE ER 30 MG PO TB24
15.0000 mg | ORAL_TABLET | Freq: Every day | ORAL | Status: DC
  Administered 2023-11-25 – 2023-11-27 (×3): 15 mg via ORAL
  Filled 2023-11-25 (×4): qty 1

## 2023-11-25 MED ORDER — ACETAMINOPHEN 325 MG PO TABS: 650.0000 mg | ORAL_TABLET | ORAL | Status: DC | PRN

## 2023-11-25 MED ORDER — INSULIN ASPART 100 UNIT/ML IJ SOLN
0.0000 [IU] | Freq: Three times a day (TID) | INTRAMUSCULAR | Status: DC
  Administered 2023-11-25: 3 [IU] via SUBCUTANEOUS
  Administered 2023-11-26: 5 [IU] via SUBCUTANEOUS
  Administered 2023-11-27: 3 [IU] via SUBCUTANEOUS

## 2023-11-25 MED ORDER — CLOPIDOGREL BISULFATE 75 MG PO TABS
75.0000 mg | ORAL_TABLET | Freq: Every day | ORAL | Status: DC
  Administered 2023-11-25 – 2023-11-27 (×3): 75 mg via ORAL
  Filled 2023-11-25 (×4): qty 1

## 2023-11-25 MED ORDER — HEPARIN (PORCINE) 25000 UT/250ML-% IV SOLN
1450.0000 [IU]/h | INTRAVENOUS | Status: DC
  Administered 2023-11-25: 1000 [IU]/h via INTRAVENOUS
  Administered 2023-11-26: 1400 [IU]/h via INTRAVENOUS
  Administered 2023-11-27: 1450 [IU]/h via INTRAVENOUS
  Filled 2023-11-25 (×3): qty 250

## 2023-11-25 MED ORDER — ASPIRIN 81 MG PO TBEC
81.0000 mg | DELAYED_RELEASE_TABLET | Freq: Every day | ORAL | Status: DC
  Administered 2023-11-26 – 2023-11-27 (×2): 81 mg via ORAL
  Filled 2023-11-25 (×3): qty 1

## 2023-11-25 MED ORDER — METOPROLOL SUCCINATE ER 25 MG PO TB24
25.0000 mg | ORAL_TABLET | Freq: Every day | ORAL | Status: DC
  Administered 2023-11-25 – 2023-11-27 (×3): 25 mg via ORAL
  Filled 2023-11-25 (×4): qty 1

## 2023-11-25 MED ORDER — INSULIN GLARGINE-YFGN 100 UNIT/ML ~~LOC~~ SOLN
10.0000 [IU] | Freq: Two times a day (BID) | SUBCUTANEOUS | Status: DC
  Administered 2023-11-25 – 2023-11-27 (×4): 10 [IU] via SUBCUTANEOUS
  Filled 2023-11-25 (×7): qty 0.1

## 2023-11-25 MED ORDER — FAMOTIDINE 20 MG PO TABS
20.0000 mg | ORAL_TABLET | Freq: Once | ORAL | Status: AC
  Administered 2023-11-25: 20 mg via ORAL
  Filled 2023-11-25: qty 1

## 2023-11-25 NOTE — ED Notes (Signed)
 This nurse called [facility] to have patient monitored.

## 2023-11-25 NOTE — H&P (Addendum)
 Cardiology Admission History and Physical   Patient ID: Courtney Olson MRN: [id number]; DOB: 1966-03-09   Admission date: 11/25/2023  PCP:  Courtney Heater, MD   [facility] Providers Cardiologist:  Kyra Phy, MD  Cardiology APP:  Gabino Joe, PA-C       Chief Complaint:  shortness of breath, upper back pain  Patient Profile:   Courtney Olson is a 58 y.o. female with history of CAD with prior stenting, paroxysmal afib, carotid artery disease, aortic atherosclerosis, hypertension, DM (on insulin), hyperlipidemia, OSA, bradycardia, vasovagal syncope, pancreatitis, UC, RA who is being seen 11/25/2023 for the evaluation of back pain felt to be the same as experienced with prior CAD/stenting.  History of Present Illness:   Ms. Courtney Olson was recently seen in clinic by Courtney Single PA-C and reported weakness, exertional intolerance, left scapular pain (has previously been her anginal equivalent). This pain was reportedly responsive to nitroglycerin . Patient also reported intermittent lightheadedness. Due to these symptoms, an outpatient stress test as well as echocardiogram were ordered. Patient reports recurrent discomfort in scapula area as well as more lightheadedness over the weekend. Pain again response to nitroglycerin . Given that her symptoms seemed to mimic those felt prior to her last LHC, patient presented to the ED. In addition to scapula pain, she also notes intermittent hypotension over the weekend, is concerned about medication dosing.   Patient admits to significant emotional stress in the last few days. Her father in law passed away over the weekend in [place]. Additionally, her husband is currently out of the country and patient has been sleeping irregularly due to time difference/trying to stay in touch with him. She has a trip/cruise planned starting Friday and worries about her ability to go on this trip.   Continues with some scapular pain in the ED.    Past  Medical History:  Diagnosis Date   Aortic calcification (HCC)    Atrial tachycardia (HCC)    Atrophic vaginitis 01/2006   Bradycardia 05/2014   CAD (coronary artery disease)    cardiac cath 05/21/2019 showing 805 mRCA, 60% D1 and D2 that were very small and patent LAD and LCx.  She underwent DES to the [facility] with residual 25% stenosis in the dRCA   Chronic pancolonic ulcerative colitis (HCC) 2015   unknown if chronic.   CKD (chronic kidney disease), stage II    Cytomegaloviral hepatitis (HCC) 2005   Diabetes mellitus without complication (HCC) 01/2006   on Ace inhibitor to protect kidneys   Gallstones 01/2013   GERD (gastroesophageal reflux disease)    Heart failure (HCC) 10/26/2020   Hyperlipidemia    Hypertension    Kidney tumor 03/2013   Right   NSVT (nonsustained ventricular tachycardia) (HCC)    Obesity    OSA (obstructive sleep apnea)    PAF (paroxysmal atrial fibrillation) (HCC)    Pancreatitis 2014   Postmenopausal    Premature atrial contractions    PVC's (premature ventricular contractions)    RA (rheumatoid arthritis) (HCC)    Syncope and collapse    Ulcerative colitis (HCC) 2015    Past Surgical History:  Procedure Laterality Date   CESAREAN SECTION     CHOLECYSTECTOMY  01/2013   COLONOSCOPY  2015   Colitis   CORONARY LITHOTRIPSY N/A 03/02/2022   Procedure: CORONARY LITHOTRIPSY;  Surgeon: Lucendia Rusk, MD;  Location: [facility] INVASIVE CV LAB;  Service: Cardiovascular;  Laterality: N/A;   CORONARY STENT INTERVENTION N/A 05/21/2019   Procedure: CORONARY  STENT INTERVENTION;  Surgeon: Lucendia Rusk, MD;  Location: [facility] INVASIVE CV LAB;  Service: Cardiovascular;  Laterality: N/A;   CORONARY STENT INTERVENTION Right 06/27/2021   Procedure: CORONARY STENT INTERVENTION;  Surgeon: Lucendia Rusk, MD;  Location: [facility] INVASIVE CV LAB;  Service: Cardiovascular;  Laterality: Right;   CORONARY STENT INTERVENTION N/A 03/02/2022   Procedure: CORONARY STENT  INTERVENTION;  Surgeon: Lucendia Rusk, MD;  Location: [facility] INVASIVE CV LAB;  Service: Cardiovascular;  Laterality: N/A;   CORONARY ULTRASOUND/IVUS N/A 03/02/2022   Procedure: Intravascular Ultrasound/IVUS;  Surgeon: Lucendia Rusk, MD;  Location: [facility] INVASIVE CV LAB;  Service: Cardiovascular;  Laterality: N/A;   DILATION AND CURETTAGE OF UTERUS  2002   HERNIA REPAIR  01/2013   Kidney and Bladder Stent and tube placed Right 03/2013   LEFT HEART CATH AND CORONARY ANGIOGRAPHY N/A 06/27/2021   Procedure: LEFT HEART CATH AND CORONARY ANGIOGRAPHY;  Surgeon: Lucendia Rusk, MD;  Location: [facility] INVASIVE CV LAB;  Service: Cardiovascular;  Laterality: N/A;   LEFT HEART CATH AND CORONARY ANGIOGRAPHY N/A 03/02/2022   Procedure: LEFT HEART CATH AND CORONARY ANGIOGRAPHY;  Surgeon: Lucendia Rusk, MD;  Location: [facility] INVASIVE CV LAB;  Service: Cardiovascular;  Laterality: N/A;   PARTIAL NEPHRECTOMY Right 02/2013   Tumor Removal    Procedure to remove kidney and bladder stent and tube Right 04/2013   RIGHT/LEFT HEART CATH AND CORONARY ANGIOGRAPHY N/A 05/21/2019   Procedure: RIGHT/LEFT HEART CATH AND CORONARY ANGIOGRAPHY;  Surgeon: Lucendia Rusk, MD;  Location: [facility] INVASIVE CV LAB;  Service: Cardiovascular;  Laterality: N/A;   TOTAL ABDOMINAL HYSTERECTOMY W/ BILATERAL SALPINGOOPHORECTOMY  2002   Secondary to AUB     Medications Prior to Admission: Prior to Admission medications   Medication Sig Start Date End Date Taking? Authorizing Provider  ALPRAZolam  (XANAX ) 0.25 MG tablet Take 1 tablet (0.25 mg total) by mouth 2 (two) times daily as needed. Patient taking differently: Take 0.125-0.25 mg by mouth 2 (two) times daily as needed for anxiety. 10/26/20  Yes Jertson, Jill Evelyn, MD  atorvastatin  (LIPITOR) 40 MG tablet Take 1 tablet (40 mg total) by mouth daily. 11/20/23  Yes Weaver, Scott T, PA-C  Cholecalciferol (VITAMIN D3 PO) Take 1 capsule by mouth daily.   Yes [provider]   clopidogrel  (PLAVIX ) 75 MG tablet Take 1 tablet (75 mg total) by mouth daily. 11/20/23  Yes Weaver, Scott T, PA-C  Cyanocobalamin (VITAMIN B12 PO) Take 1 tablet by mouth daily.   Yes [provider]  Dulaglutide  (TRULICITY ) 0.75 MG/0.5ML SOPN Inject 0.75 mg into the skin every Monday. 06/05/17  Yes [provider]  ezetimibe  (ZETIA ) 10 MG tablet Take 1 tablet (10 mg total) by mouth daily. 11/20/23  Yes Weaver, Scott T, PA-C  fluticasone (FLONASE) 50 MCG/ACT nasal spray Place 1 spray into both nostrils daily as needed for allergies. 08/07/22  Yes [provider]  furosemide  (LASIX ) 20 MG tablet Take 1 tablet (20 mg total) by mouth as needed for fluid or edema. 11/20/23  Yes Weaver, Scott T, PA-C  HUMALOG KWIKPEN 100 UNIT/ML KwikPen Inject 20 Units into the skin in the morning and at bedtime. 05/31/22  Yes [provider]  hydrocortisone 2.5 % cream Apply 1 Application topically as needed (for itching). 12/06/22  Yes [provider]  ketoconazole (NIZORAL) 2 % cream Apply 1 Application topically 2 (two) times daily as needed. 06/14/22  Yes [provider]  magnesium  oxide (MAG-OX) 400 MG tablet Take 1  tablet (400 mg total) by mouth in the morning, at noon, and at bedtime. Patient taking differently: Take 400 mg by mouth 2 (two) times daily. 05/17/23  Yes Odie Benne, MD  mesalamine  (LIALDA ) 1.2 g EC tablet Take 1.2 g by mouth daily.    Yes [provider]  metoprolol  succinate (TOPROL -XL) 25 MG 24 hr tablet Take 1 tablet (25 mg total) by mouth daily. 11/20/23  Yes Weaver, Scott T, PA-C  Multiple Vitamin (MULTIVITAMIN) tablet Take 1 tablet by mouth daily. Pack Heart healthy   Yes [provider]  nitroGLYCERIN  (NITROSTAT ) 0.4 MG SL tablet Place 1 tablet (0.4 mg total) under the tongue every 5 (five) minutes as needed. 06/27/21  Yes Sanjuanita Cruz, NP  ramipril  (ALTACE ) 2.5 MG capsule Take 1 capsule (2.5 mg total) by mouth  daily. 11/20/23  Yes Weaver, Scott T, PA-C  rivaroxaban  (XARELTO ) 20 MG TABS tablet Take 1 tablet (20 mg total) by mouth daily. 11/20/23  Yes Weaver, Scott T, PA-C  TOUJEO SOLOSTAR 300 UNIT/ML Solostar Pen Inject 20 Units into the skin in the morning and at bedtime.   Yes [provider]  EPINEPHrine  0.3 mg/0.3 mL IJ SOAJ injection Inject 0.3 mLs into the skin as needed. Patient not taking: Reported on 11/25/2023 12/16/16   [provider]  estradiol  (ESTRACE  VAGINAL) 0.1 MG/GM vaginal cream Place 1 g vaginally 2 (two) times a week. Initial dose: nightly x 2 weeks, then twice weekly Patient not taking: Reported on 11/25/2023 05/31/22   Andee Bamberger, NP     Allergies:    Allergies  Allergen Reactions   Orange (Diagnostic) Anaphylaxis, Diarrhea, Hives, Itching, Nausea And Vomiting, Palpitations, Rash, Shortness Of Breath and Swelling   Empagliflozin Other (See Comments)    burning   Metformin Hcl Other (See Comments)    Gi upset   Nitrofurantoin Itching    Ears burning   Bactrim [Sulfamethoxazole-Trimethoprim] Rash    Social History:   Social History   Socioeconomic History   Marital status: Married    Spouse name: Not on file   Number of children: Not on file   Years of education: 16   Highest education level: Bachelor's degree (e.g., BA, AB, BS)  Occupational History   Not on file  Tobacco Use   Smoking status: Never   Smokeless tobacco: Never  Vaping Use   Vaping status: Never Used  Substance and Sexual Activity   Alcohol use: Yes    Comment: 2 a month   Drug use: No   Sexual activity: Yes    Partners: Male    Birth control/protection: Surgical    Comment: TAH/BSO  Other Topics Concern   Not on file  Social History Narrative   Not on file   Social Drivers of Health   Financial Resource Strain: Not on file  Food Insecurity: Not on file  Transportation Needs: Not on file  Physical Activity: Not on file  Stress: No Stress Concern Present  (06/21/2020)   Received from [facility], [facility]   [employer] [facility] - Occupational Stress Questionnaire    Feeling of Stress : Not at all  Social Connections: Unknown (11/23/2021)   Received from [facility], [facility]   Social Network    Social Network: Not on file  Intimate Partner Violence: Unknown (10/18/2021)   Received from [facility], [facility]   HITS    Physically Hurt: Not on file    Insult or Talk Down To:  Not on file    Threaten Physical Harm: Not on file    Scream or Curse: Not on file    Family History:   The patient's family history includes Breast cancer (age of onset: 88) in her sister; Cancer in her maternal grandmother and paternal aunt; Diabetes in her father, maternal grandmother, mother, and paternal aunt; Fibromyalgia in her mother; Heart disease in her father; Hypertension in her father, mother, and sister.    ROS:  Please see the history of present illness.  All other ROS reviewed and negative.     Physical Exam/Data:   Vitals:   11/25/23 0730 11/25/23 0733 11/25/23 0800 11/25/23 0920  BP: (!) 164/86 (!) 164/86 (!) 148/125 (!) 151/79  Pulse: 76 70 73 67  Resp: 15 17 (!) 21 16  Temp:  98.5 F (36.9 C)    TempSrc:  Oral    SpO2: 100% 100% 100% 100%  Weight:      Height:       No intake or output data in the 24 hours ending 11/25/23 1028    11/25/2023    4:10 AM 11/20/2023    3:33 PM 03/26/2023    9:28 AM  Last 3 Weights  Weight (lbs) 205 lb 211 lb 6.4 oz 207 lb  Weight (kg) 92.987 kg 95.89 kg 93.895 kg     Body mass index is 34.11 kg/m.  General:  Well nourished, well developed, in no acute distress HEENT: normal Neck: no JVD Vascular: No carotid bruits; Distal pulses 2+ bilaterally   Cardiac:  normal S1, S2; RRR; 2/6 systolic murmur Lungs:  clear to auscultation bilaterally, no wheezing, rhonchi or rales  Abd: soft, nontender, no hepatomegaly  Ext: no edema Musculoskeletal:  No deformities, BUE and  BLE strength normal and equal Skin: warm and dry  Neuro:  CNs 2-12 intact, no focal abnormalities noted Psych:  Normal affect    EKG:  The ECG that was done was personally reviewed and demonstrates sinus rhythm with no acute ischemic changes.   Relevant CV Studies:  03/02/22 LHC    Ost Cx to Mid Cx lesion is 25% stenosed.   Dist RCA lesion is 25% stenosed.   Prox RCA lesion is 25% stenosed.   2nd Diag lesion is 60% stenosed.   1st Diag lesion is 60% stenosed.   Mid RCA-1 lesion is 90% stenosed.  A drug-eluting stent was successfully placed using a SYNERGY XD 3.50X12, dilated to high pressure and optimized with intravascular ultrasound.   Post intervention, there is a 0% residual stenosis.   Mid RCA-3 lesion is 95% stenosed.  Treated with balloon angioplasty followed by coronary lithotripsy with 3.0 x 12 mm balloon, and 3.5 [place] balloon.   Post intervention, there is a 0% residual stenosis.   Non-stenotic Mid RCA-2 lesion was previously treated.   Successful PCI of the mid RCA with intravascular lithotripsy.  Stent placed into the more proximal lesion across the proximal edge of prior stent.  The in-stent restenosis in the prior overlap area was treated with shockwave balloon and [place] balloon.   XB RCA guide along with telescope guide extension catheter provided enough support to deliver the shockwave balloon.   Continue aspirin  and clopidogrel .  Restart Xarelto  tomorrow.  After 1 week, can stop aspirin  and continue Xarelto  and clopidogrel .  I instructed her to take her Xarelto  with her largest meal of the day, typically in the evening.  Laboratory Data:  High Sensitivity Troponin:   Recent Labs  Lab  11/25/23 0415 11/25/23 0535  TROPONINIHS 7 10      Chemistry Recent Labs  Lab 11/25/23 0415  NA 139  K 4.0  CL 101  CO2 26  GLUCOSE 86  BUN 34*  CREATININE 1.06*  CALCIUM  9.9  GFRNONAA >60  ANIONGAP 12    Recent Labs  Lab 11/25/23 0415  PROT 8.0  ALBUMIN 4.2  AST 34   ALT 39  ALKPHOS 75  BILITOT 0.7   Lipids  Recent Labs  Lab 11/25/23 0415  CHOL 133  TRIG 97  HDL 65  LDLCALC 49  CHOLHDL 2.0   Hematology Recent Labs  Lab 11/25/23 0415  WBC 6.6  RBC 4.49  HGB 13.5  HCT 41.1  MCV 91.5  MCH 30.1  MCHC 32.8  RDW 14.9  PLT 209   Thyroid  Recent Labs  Lab 11/25/23 0415  TSH 4.590*   BNP Recent Labs  Lab 11/25/23 0535  BNP 68.8    DDimer No results for input(s): "DDIMER" in the last 168 hours.   Radiology/Studies:  DG Chest 2 View Result Date: 11/25/2023 CLINICAL DATA:  Chest pain. EXAM: CHEST - 2 VIEW COMPARISON:  08/29/2022 FINDINGS: The lungs are clear without focal pneumonia, edema, or pneumothorax. Lateral film demonstrates tiny bilateral pleural effusions. Interstitial markings are diffusely coarsened with chronic features. The cardiopericardial silhouette is within normal limits for size. No acute bony abnormality. Telemetry leads overlie the chest. IMPRESSION: 1. Tiny bilateral pleural effusions. 2. Chronic interstitial coarsening. Electronically Signed   By: Donnal Fusi M.D.   On: 11/25/2023 05:42     Assessment and Plan:   CAD Patient with prior stenting of RCA in 2020 and 2022. Also had DES with mid RCA with lithotripsy in August 2023 due to in-stent restenosis. Otherwise with moderate nonobstructive CAD. Previous anginal equivalent for patient has been left scapular pain. Patient with similar pain responsive to nitroglycerin  in the past few weeks. Also noting decreased exertional tolerance and lightheadedness.  HS troponin in the ED negative. ECG without acute ischemic changes. However, given similarity of symptoms to when patient previously had obstructive CAD/in-stent restenosis, concerned about recurrent obstruction. Will discuss inpatient stress testing vs LHC with Dr. Lorie Rook. If catheterization planned, would need to delay until 5/13 given last Xarelto  taken on 5/11 around 5:30 pm. Given multi-layered stenting,  POBA likely to be the only option on cath.  Continue Plavix  75mg  daily Continue Metoprolol  Succinate 25mg  daily PRN nitroglycerin  for chest pain Imdur  15mg   Paroxysmal afib Sinus rhythm this admission.  Heparin  bridge given possible ischemic evaluation  Aortic valve calcification Noted on 2022 echo. Grade 2/6 murmur at RUSB in the ED. Check echocardiogram  Hypertension Patient with elevated BP in the ED today. Has not received home medications yet.  Continue Toprol  XL 25mg  Imdur  15mg  Hold ACEi pending LHC  Hypercholesterolemia Continue Atorvastatin  40mg  and Zetia  10mg    Risk Assessment/Risk Scores:    TIMI Risk Score for Unstable Angina or Non-ST Elevation MI:   The patient's TIMI risk score is 3, which indicates a 13% risk of all cause mortality, new or recurrent myocardial infarction or need for urgent revascularization in the next 14 days.    CHA2DS2-VASc Score = 4   This indicates a 4.8% annual risk of stroke. The patient's score is based upon: CHF History: 0 HTN History: 1 Diabetes History: 1 Stroke History: 0 Vascular Disease History: 1 Age Score: 0 Gender Score: 1     Code Status: Full Code  Severity of  Illness: The appropriate patient status for this patient is OBSERVATION. Observation status is judged to be reasonable and necessary in order to provide the required intensity of service to ensure the patient's safety. The patient's presenting symptoms, physical exam findings, and initial radiographic and laboratory data in the context of their medical condition is felt to place them at decreased risk for further clinical deterioration. Furthermore, it is anticipated that the patient will be medically stable for discharge from the hospital within 2 midnights of admission.    For questions or updates, please contact [facility] Please consult [email or web address] for contact info under     Signed, Leala Prince, PA-C  11/25/2023 10:28 AM       ATTENDING ATTESTATION:  After conducting a review of all available clinical information with the care team, interviewing the patient, and performing a physical exam, I agree with the findings and plan described in this note.   GEN: No acute distress.   HEENT:  MMM, no JVD, no scleral icterus Cardiac: RRR, no murmurs, rubs, or gallops.  Respiratory: Clear to auscultation bilaterally. GI: Soft, nontender, non-distended  MS: No edema; No deformity. Neuro:  Nonfocal  Vasc:  weak/absent R radial pulse  The patient is a 58 year old female with a history of coronary artery disease status post PCI of the right coronary artery.  Of note the patient had PCI of the right coronary artery for de novo disease first in 2020 with 1 drug-eluting stent to the mid right coronary artery.  In 2022 she developed angina restenosis of the distal edge of the stent as well as restenosis in the midportion of the stent.  The distal edge restenosis was treated with another stent in the restenosis inside the body of the stent was treated with balloon angioplasty.  She then presented in 2023 and again had restenosis and basically these 2 same areas.  The distal area underwent balloon angioplasty in the proximal area underwent lithotripsy and stenting.  She therefore has functionally 2 layers of stent throughout the stented region of the right coronary artery.  IVUS was performed after the stent was placed.  It looks to be relatively well-expanded but it is hard for me to comment on any calcification behind the stent.  On some still frames it looks like the stent in the distal aspect may be underexpanded.  She unfortunately developed the same atypical anginal symptoms including left scapular pain which resulted in all of her PCI procedures.  Her last dose of Eliquis was yesterday at around 5 PM.  Her troponins are negative.  She is comfortable currently.  We will proceed to coronary angiography tomorrow.  I think it is  incumbent to do intravascular imaging and OCT or high-definition IVUS may be more helpful to determine the mechanism of stent failure if she has had a recurrence of ISR.  If she is developing biologic restenosis meaning her stent is well sized and there is no underexpansion then perhaps a drug-coated balloon could be pursued.  Otherwise I think balloon angioplasty is probably her only option again if recurrence of disease is seen.  I do not think another stent would be advisable given her 2 layers of stent already in place.  I discussed all of these complex issues with the patient.  She understands and agrees with the plan.  N.p.o. at midnight for coronary angiography tomorrow.  Unfortunately her right radial pulse is very weak if not absent.  Alternative access may need to be pursued  Geordie Nooney  Lorie Rook, MD Pager [phone]

## 2023-11-25 NOTE — ED Notes (Signed)
 Reached out to Echo to inquire about status of patient ordered echocardiogram. No answer at this time, pt updated.

## 2023-11-25 NOTE — Progress Notes (Signed)
IV obtained by ED RN.

## 2023-11-25 NOTE — ED Provider Notes (Signed)
 Emergency Department Provider Note   I have reviewed the triage vital signs and the nursing notes.   HISTORY  Chief Complaint Chest Pain   HPI Courtney Olson is a 58 y.o. female past history of CAD status post stenting of the RCA on multiple occasions, CKD, diabetes, hypertension/hyperlipidemia presents emergency department with intermittent pain in her left upper back.  This pain is familiar to her and similar to prior episodes of angina and led to heart cath and stenting in the past.  She has had somewhat increasing frequency of these episodes.  On multiple occasion she has taken nitroglycerin , including last night, with resolution of pain symptoms.  She is currently pain-free.  No diaphoresis or nausea/vomiting.  No shortness of breath.  She has been following with her cardiologist and has a stress test scheduled for Thursday and echo in early June.  She states she was trying to wait for those appointments but with the familiar pain returning and becoming more frequent she consulted with cardiology tonight by phone who advised she present to the ED for evaluation.   Past Medical History:  Diagnosis Date   Aortic calcification (HCC)    Atrial tachycardia (HCC)    Atrophic vaginitis 01/2006   Bradycardia 05/2014   CAD (coronary artery disease)    cardiac cath 05/21/2019 showing 805 mRCA, 60% D1 and D2 that were very small and patent LAD and LCx.  She underwent DES to the [facility] with residual 25% stenosis in the dRCA   Chronic pancolonic ulcerative colitis (HCC) 2015   unknown if chronic.   CKD (chronic kidney disease), stage II    Cytomegaloviral hepatitis (HCC) 2005   Diabetes mellitus without complication (HCC) 01/2006   on Ace inhibitor to protect kidneys   Gallstones 01/2013   GERD (gastroesophageal reflux disease)    Heart failure (HCC) 10/26/2020   Hyperlipidemia    Hypertension    Kidney tumor 03/2013   Right   NSVT (nonsustained ventricular tachycardia) (HCC)     Obesity    OSA (obstructive sleep apnea)    PAF (paroxysmal atrial fibrillation) (HCC)    Pancreatitis 2014   Postmenopausal    Premature atrial contractions    PVC's (premature ventricular contractions)    RA (rheumatoid arthritis) (HCC)    Syncope and collapse    Ulcerative colitis (HCC) 2015    Review of Systems  Constitutional: No fever/chills Cardiovascular: Positive chest pain. Respiratory: Denies shortness of breath. Gastrointestinal: No abdominal pain.  No nausea, no vomiting.  Musculoskeletal: Negative for back pain. Skin: Negative for rash. Neurological: Negative for headaches.  ____________________________________________   PHYSICAL EXAM:  VITAL SIGNS: ED Triage Vitals  Encounter Vitals Group     BP 11/25/23 0400 (!) 193/103     Pulse Rate 11/25/23 0400 93     Resp 11/25/23 0400 20     Temp 11/25/23 0400 98.7 F (37.1 C)     Temp src --      SpO2 11/25/23 0400 100 %     Weight 11/25/23 0410 205 lb (93 kg)     Height 11/25/23 0410 5\' 5"  (1.651 m)   Constitutional: Alert and oriented. Well appearing and in no acute distress. Eyes: Conjunctivae are normal. Head: Atraumatic. Nose: No congestion/rhinnorhea. Mouth/Throat: Mucous membranes are moist.  Neck: No stridor.   Cardiovascular: Normal rate, regular rhythm. Good peripheral circulation. Grossly normal heart sounds.   Respiratory: Normal respiratory effort.  No retractions. Lungs CTAB. Gastrointestinal: Soft and nontender. No distention.  Musculoskeletal: No gross deformities of extremities. Neurologic:  Normal speech and language.  Skin:  Skin is warm, dry and intact. No rash noted.   ____________________________________________   LABS (all labs ordered are listed, but only abnormal results are displayed)  Labs Reviewed  BASIC METABOLIC PANEL WITH GFR - Abnormal; Notable for the following components:      Result Value   BUN 34 (*)    Creatinine, Ser 1.06 (*)    All other components within  normal limits  CBC  HEPATIC FUNCTION PANEL  LIPASE, BLOOD  BRAIN NATRIURETIC PEPTIDE  HEMOGLOBIN A1C  TSH  LIPID PANEL  TROPONIN I (HIGH SENSITIVITY)  TROPONIN I (HIGH SENSITIVITY)   ____________________________________________  EKG   EKG Interpretation Date/Time:  Monday Nov 25 2023 04:08:53 EDT Ventricular Rate:  84 PR Interval:  168 QRS Duration:  90 QT Interval:  372 QTC Calculation: 439 R Axis:   73  Text Interpretation: Normal sinus rhythm Normal ECG When compared with ECG of 20-Nov-2023 15:38, PREVIOUS ECG IS PRESENT Confirmed by Abby Hocking [phone]) on 11/25/2023 4:55:47 AM        ____________________________________________  RADIOLOGY  DG Chest 2 View Result Date: 11/25/2023 CLINICAL DATA:  Chest pain. EXAM: CHEST - 2 VIEW COMPARISON:  08/29/2022 FINDINGS: The lungs are clear without focal pneumonia, edema, or pneumothorax. Lateral film demonstrates tiny bilateral pleural effusions. Interstitial markings are diffusely coarsened with chronic features. The cardiopericardial silhouette is within normal limits for size. No acute bony abnormality. Telemetry leads overlie the chest. IMPRESSION: 1. Tiny bilateral pleural effusions. 2. Chronic interstitial coarsening. Electronically Signed   By: Donnal Fusi M.D.   On: 11/25/2023 05:42    ____________________________________________   PROCEDURES  Procedure(s) performed:   Procedures  CRITICAL CARE Performed by: Roberts Ching Total critical care time: 35 minutes Critical care time was exclusive of separately billable procedures and treating other patients. Critical care was necessary to treat or prevent imminent or life-threatening deterioration. Critical care was time spent personally by me on the following activities: development of treatment plan with patient and/or surrogate as well as nursing, discussions with consultants, evaluation of patient's response to treatment, examination of patient, obtaining history  from patient or surrogate, ordering and performing treatments and interventions, ordering and review of laboratory studies, ordering and review of radiographic studies, pulse oximetry and re-evaluation of patient's condition.  Abby Hocking, MD Emergency Medicine  ____________________________________________   INITIAL IMPRESSION / ASSESSMENT AND PLAN / ED COURSE  Pertinent labs & imaging results that were available during my care of the patient were reviewed by me and considered in my medical decision making (see chart for details).   This patient is Presenting for Evaluation of CP, which does require a range of treatment options, and is a complaint that involves a high risk of morbidity and mortality.  The Differential Diagnoses includes but is not exclusive to acute coronary syndrome, aortic dissection, pulmonary embolism, cardiac tamponade, community-acquired pneumonia, pericarditis, musculoskeletal chest wall pain, etc.   Critical Interventions-    Medications  famotidine  (PEPCID ) tablet 20 mg (20 mg Oral Given 11/25/23 6045)    Reassessment after intervention:  No CP  I decided to review pertinent External Data, and in summary telephone note from today and recent Cardiology progress note reviewed.   Clinical Laboratory Tests Ordered, included normal troponin. No AKI or electrolyte disturbance. No anemia.   Radiologic Tests Ordered, included CXR. I independently interpreted the images and agree with radiology interpretation.   Cardiac Monitor Tracing  which shows NSR.    Social Determinants of Health Risk patient is a non-smoker.   Consult complete with Cardiology fellow, Dr. Jacklyn Mash. Plan for Cardiology admit after 6AM. Will start heparin .   Medical Decision Making: Summary:  Patient presents to the ED with CP intermittently over the last week, increasing in frequency. Concern clinically for unstable angina with similar symptoms in the past. Plan for Cardiology consultation  in the ED.   Reevaluation with update and discussion with patient. Plan for admit. She is in agreement. No active CP.   Patient's presentation is most consistent with acute presentation with potential threat to life or bodily function.   Disposition: admit  ____________________________________________  FINAL CLINICAL IMPRESSION(S) / ED DIAGNOSES  Final diagnoses:  Unstable angina (HCC)    Note:  This document was prepared using Dragon voice recognition software and may include unintentional dictation errors.  Abby Hocking, MD, [facility] Emergency Medicine    Ayleen Mckinstry, Shereen Dike, MD 11/26/23 [phone]

## 2023-11-25 NOTE — ED Triage Notes (Signed)
 Patient presents to ed  c/o left upper back pain states throbbing type pain off and on, states she has had 3 cardiac caths since 2020 and each time sx. Were the same and she would have a blockage. States she saw her cards on thurs and was told she had a murmur of which she states was new is scheduled for a stress test this coming thurs. . States she has been under a lot of stress lately FIL just died 2023-12-23.

## 2023-11-25 NOTE — ED Notes (Signed)
 Patient transported to X-ray

## 2023-11-25 NOTE — ED Notes (Signed)
Patient returned from XR. 

## 2023-11-25 NOTE — Progress Notes (Signed)
 PHARMACY - ANTICOAGULATION CONSULT NOTE  Pharmacy Consult for heparin  Indication: chest pain/ACS and atrial fibrillation  Allergies  Allergen Reactions   Orange (Diagnostic) Anaphylaxis, Diarrhea, Hives, Itching, Nausea And Vomiting, Palpitations, Rash, Shortness Of Breath and Swelling   Empagliflozin Other (See Comments)    burning   Metformin Hcl Other (See Comments)    Gi upset   Nitrofurantoin Itching    Ears burning   Bactrim [Sulfamethoxazole-Trimethoprim] Rash    Patient Measurements: Height: 5\' 5"  (165.1 cm) Weight: 93 kg (205 lb) IBW/kg (Calculated) : 57 HEPARIN  DW (KG): 77.8  Vital Signs: Temp: 98.5 F (36.9 C) (05/12 0733) Temp Source: Oral (05/12 0733) BP: 148/125 (05/12 0800) Pulse Rate: 73 (05/12 0800)  Labs: Recent Labs    11/25/23 0415 11/25/23 0535  HGB 13.5  --   HCT 41.1  --   PLT 209  --   CREATININE 1.06*  --   TROPONINIHS 7 10    Estimated Creatinine Clearance: 65.2 mL/min (A) (by C-G formula based on SCr of 1.06 mg/dL (H)).   Medical History: Past Medical History:  Diagnosis Date   Aortic calcification (HCC)    Atrial tachycardia (HCC)    Atrophic vaginitis 01/2006   Bradycardia 05/2014   CAD (coronary artery disease)    cardiac cath 05/21/2019 showing 805 mRCA, 60% D1 and D2 that were very small and patent LAD and LCx.  She underwent DES to the [facility] with residual 25% stenosis in the dRCA   Chronic pancolonic ulcerative colitis (HCC) 2015   unknown if chronic.   CKD (chronic kidney disease), stage II    Cytomegaloviral hepatitis (HCC) 2005   Diabetes mellitus without complication (HCC) 01/2006   on Ace inhibitor to protect kidneys   Gallstones 01/2013   GERD (gastroesophageal reflux disease)    Heart failure (HCC) 10/26/2020   Hyperlipidemia    Hypertension    Kidney tumor 03/2013   Right   NSVT (nonsustained ventricular tachycardia) (HCC)    Obesity    OSA (obstructive sleep apnea)    PAF (paroxysmal atrial fibrillation)  (HCC)    Pancreatitis 2014   Postmenopausal    Premature atrial contractions    PVC's (premature ventricular contractions)    RA (rheumatoid arthritis) (HCC)    Syncope and collapse    Ulcerative colitis (HCC) 2015    Assessment: Courtney Olson presenting with CP/back pain, hx CAD, hx of A fib on Xarelto  PTA with last dose taken 5/11 @1730 , to transition to heparin  gtt for ACS rule out  Goal of Therapy:  Heparin  level 0.3-0.7 units/ml aPTT 66-102 seconds Monitor platelets by anticoagulation protocol: Yes   Plan:  Heparin  gtt 1000 units/hr, no bolus F/u 6 hour aPTT and Anti-Xa level F/u cards plan and ability to transition back to Xarelto   Trinidad Funk, PharmD, [facility] Clinical Pharmacist ED Pharmacist Phone # [phone] 11/25/2023 8:12 AM

## 2023-11-25 NOTE — Telephone Encounter (Signed)
 Telephone Call  Received a page/phone call that patient was on her way and now in our ED.   Prior PMH notable for hx of DES to RCA (05/2019 and repeat DES to RCA in 2022 due to secondary ISR with repeat DES to mid RCA with shockwave lithotripsy to mid-RCA due to ISR in 02/2022. She had moderate residual non-obstrutive disease elsewhere. TTE on 06/2021 demonstrated LVEF.  Patient was recently seen by Dr. Reyne Cave in Clinic on 5/7 where she was noted to have back pain similar to her prior MI that improved with nitroglycerin . She does note significant stress/anxiety due to her father-in-law being injured and her husband is now away. She was ordered for an exercise stress test, TTE.  Patient called in reporting she has continued to have intermittent mild throbbing in her upper back similar to her prior MI's. She reports her BP has been low 108/49 (pt concerned about diastolic). She had some upper back pain tonight and again woke up from sleep due to this throbbing back pain with a burning sensation, reported BP was higher than normal (unknown systolic/100). She reports feeling very anxious for reasons above; also has plans for travel to [place] on Friday and then going on a cruise for a week.   Will plan for formal evaluation once patient is in the ER. Please obtain: --A1c, TSH, Lipid panel, TTE, BNP --EKG, trend troponin --Continue atorvastatin  40 mg daily --Continue clopidogrel  75 mg daily --Continue ezetimibe  10 mg daily --Continue metoprolol  succinate 25 mg daily --Continue nitroglycerin  as needed --Continue ramipril  2.5 mg daily --hold rivaroxaban  and can start heparin  gtt (confirm timing of last dose of AC) --NPO, may consider further ischemic work-up pending above  Courtney Olson  11/25/23 4:00 AM

## 2023-11-25 NOTE — ED Notes (Signed)
 This nurse observed patient's monitor and noted that patient was experiencing bigeminy. This nurse was unable to capture EKG of event. EDP Long, Shereen Dike, MD updated and notified.

## 2023-11-25 NOTE — Progress Notes (Signed)
 PHARMACY - ANTICOAGULATION CONSULT NOTE  Pharmacy Consult for heparin  Indication: chest pain/ACS and atrial fibrillation  Allergies  Allergen Reactions   Orange (Diagnostic) Anaphylaxis, Diarrhea, Hives, Itching, Nausea And Vomiting, Palpitations, Rash, Shortness Of Breath and Swelling   Empagliflozin Other (See Comments)    burning   Metformin Hcl Other (See Comments)    Gi upset   Nitrofurantoin Itching    Ears burning   Bactrim [Sulfamethoxazole-Trimethoprim] Rash    Patient Measurements: Height: 5\' 5"  (165.1 cm) Weight: 93 kg (205 lb) IBW/kg (Calculated) : 57 HEPARIN  DW (KG): 77.8  Vital Signs: Temp: 99.1 F (37.3 C) (05/12 1957) Temp Source: Oral (05/12 1957) BP: 104/74 (05/12 1958) Pulse Rate: 70 (05/12 1958)  Labs: Recent Labs    11/25/23 0415 11/25/23 0535 11/25/23 2124  HGB 13.5  --   --   HCT 41.1  --   --   PLT 209  --   --   APTT  --   --  43*  HEPARINUNFRC  --   --  >1.10*  CREATININE 1.06*  --   --   TROPONINIHS 7 10  --     Estimated Creatinine Clearance: 65.2 mL/min (A) (by C-G formula based on SCr of 1.06 mg/dL (H)).   Medical History: Past Medical History:  Diagnosis Date   Aortic calcification (HCC)    Atrial tachycardia (HCC)    Atrophic vaginitis 01/2006   Bradycardia 05/2014   CAD (coronary artery disease)    cardiac cath 05/21/2019 showing 805 mRCA, 60% D1 and D2 that were very small and patent LAD and LCx.  She underwent DES to the [facility] with residual 25% stenosis in the dRCA   Chronic pancolonic ulcerative colitis (HCC) 2015   unknown if chronic.   CKD (chronic kidney disease), stage II    Cytomegaloviral hepatitis (HCC) 2005   Diabetes mellitus without complication (HCC) 01/2006   on Ace inhibitor to protect kidneys   Gallstones 01/2013   GERD (gastroesophageal reflux disease)    Heart failure (HCC) 10/26/2020   Hyperlipidemia    Hypertension    Kidney tumor 03/2013   Right   NSVT (nonsustained ventricular tachycardia)  (HCC)    Obesity    OSA (obstructive sleep apnea)    PAF (paroxysmal atrial fibrillation) (HCC)    Pancreatitis 2014   Postmenopausal    Premature atrial contractions    PVC's (premature ventricular contractions)    RA (rheumatoid arthritis) (HCC)    Syncope and collapse    Ulcerative colitis (HCC) 2015    Assessment: 73 YOF presenting with CP/back pain, hx CAD, hx of A fib on Xarelto  PTA with last dose taken 5/11 @1730 , to transition to heparin  gtt for ACS rule out  PTT came back subtherapeutic at 43. We will increase rate and check level in AM.   Goal of Therapy:  Heparin  level 0.3-0.7 units/ml aPTT 66-102 seconds Monitor platelets by anticoagulation protocol: Yes   Plan:  Increase heparin  gtt 1200 units/hr, no bolus F/u 6 hour aPTT F/u cards plan and ability to transition back to Xarelto   Ivery Marking, PharmD, BCIDP, AAHIVP, CPP Infectious Disease Pharmacist 11/25/2023 10:39 PM

## 2023-11-26 ENCOUNTER — Observation Stay (HOSPITAL_BASED_OUTPATIENT_CLINIC_OR_DEPARTMENT_OTHER)

## 2023-11-26 DIAGNOSIS — R079 Chest pain, unspecified: Secondary | ICD-10-CM

## 2023-11-26 DIAGNOSIS — I200 Unstable angina: Secondary | ICD-10-CM | POA: Diagnosis not present

## 2023-11-26 LAB — ECHOCARDIOGRAM COMPLETE
Area-P 1/2: 3.65 cm2
Height: 65 in
S' Lateral: 2.8 cm
Weight: 3280 [oz_av]

## 2023-11-26 LAB — GLUCOSE, CAPILLARY
Glucose-Capillary: 233 mg/dL — ABNORMAL HIGH (ref 70–99)
Glucose-Capillary: 167 mg/dL — ABNORMAL HIGH (ref 70–99)
Glucose-Capillary: 115 mg/dL — ABNORMAL HIGH (ref 70–99)
Glucose-Capillary: 308 mg/dL — ABNORMAL HIGH (ref 70–99)

## 2023-11-26 LAB — CBC
HCT: 37.4 % (ref 36.0–46.0)
Hemoglobin: 12.5 g/dL (ref 12.0–15.0)
MCH: 29.9 pg (ref 26.0–34.0)
MCHC: 33.4 g/dL (ref 30.0–36.0)
MCV: 89.5 fL (ref 80.0–100.0)
Platelets: 196 10*3/uL (ref 150–400)
RBC: 4.18 MIL/uL (ref 3.87–5.11)
RDW: 15 % (ref 11.5–15.5)
WBC: 6.2 10*3/uL (ref 4.0–10.5)
nRBC: 0 % (ref 0.0–0.2)

## 2023-11-26 LAB — APTT
aPTT: 63 s — ABNORMAL HIGH (ref 24–36)
aPTT: 66 s — ABNORMAL HIGH (ref 24–36)

## 2023-11-26 MED ORDER — SODIUM CHLORIDE 0.9 % WEIGHT BASED INFUSION
1.0000 mL/kg/h | INTRAVENOUS | Status: DC
  Administered 2023-11-26 – 2023-11-27 (×2): 1 mL/kg/h via INTRAVENOUS

## 2023-11-26 MED ORDER — PERFLUTREN LIPID MICROSPHERE
1.0000 mL | INTRAVENOUS | Status: AC | PRN
  Administered 2023-11-26: 2 mL via INTRAVENOUS

## 2023-11-26 MED ORDER — SODIUM CHLORIDE 0.9 % WEIGHT BASED INFUSION
3.0000 mL/kg/h | INTRAVENOUS | Status: AC
  Administered 2023-11-26: 3 mL/kg/h via INTRAVENOUS

## 2023-11-26 MED ORDER — EZETIMIBE 10 MG PO TABS
10.0000 mg | ORAL_TABLET | Freq: Every day | ORAL | Status: DC
  Administered 2023-11-26 – 2023-11-27 (×2): 10 mg via ORAL
  Filled 2023-11-26 (×3): qty 1

## 2023-11-26 MED ORDER — MESALAMINE 1.2 G PO TBEC
1.2000 g | DELAYED_RELEASE_TABLET | Freq: Every day | ORAL | Status: DC
  Administered 2023-11-26 – 2023-11-27 (×2): 1.2 g via ORAL
  Filled 2023-11-26 (×3): qty 1

## 2023-11-26 MED ORDER — ALPRAZOLAM 0.25 MG PO TABS: 0.2500 mg | ORAL_TABLET | Freq: Two times a day (BID) | ORAL | Status: DC | PRN

## 2023-11-26 NOTE — Progress Notes (Addendum)
 Patient Name: Courtney Olson Date of Encounter: 11/26/2023 [facility] Cardiologist: Blas Riches K Gevorg Brum, MD   Interval Summary  .    Patient very anxious this morning but is without chest pain or dyspnea. She is nervous about timing of LHC with planned trip to [place] on Friday.   Vital Signs .    Vitals:   11/25/23 1958 11/25/23 2300 11/26/23 0452 11/26/23 0727  BP: 104/74 124/75 (!) 145/76 (!) 167/75  Pulse: 70 74 66 71  Resp: 15 16 18 18   Temp:  98.6 F (37 C) 98.5 F (36.9 C) 97.7 F (36.5 C)  TempSrc:  Oral Oral Axillary  SpO2: 97% 97% 90% 97%  Weight:      Height:       No intake or output data in the 24 hours ending 11/26/23 0801    11/25/2023    4:10 AM 11/20/2023    3:33 PM 03/26/2023    9:28 AM  Last 3 Weights  Weight (lbs) 205 lb 211 lb 6.4 oz 207 lb  Weight (kg) 92.987 kg 95.89 kg 93.895 kg      Telemetry/ECG    Sinus rhythm with intermittent bigeminy/PVCs - Personally Reviewed  Physical Exam .   GEN: No acute distress.   Neck: No JVD Cardiac: RRR, 2/6 RUSB systolic murmur.  Respiratory: Clear to auscultation bilaterally. GI: Soft, nontender, non-distended  MS: No edema  Assessment & Plan .    CAD Patient with prior stenting of RCA in 2020 and 2022. Also had DES with mid RCA with lithotripsy in August 2023 due to in-stent restenosis. Otherwise with moderate nonobstructive CAD. Previous anginal equivalent for patient has been left scapular pain. Patient with similar pain responsive to nitroglycerin  in the past few weeks. Also noting decreased exertional tolerance and lightheadedness. HS troponin in the ED negative. ECG without acute ischemic changes.   LHC today given similarity of symptoms to when patient previously had obstructive CAD/in-stent restenosis.  Continue Plavix  75mg  daily. Adding ASA 81mg  pending cath Continue Metoprolol  Succinate 25mg  daily PRN nitroglycerin  for chest pain Imdur  15mg    Paroxysmal afib Sinus rhythm this  admission.  Heparin  bridge given LHC   Aortic valve calcification Noted on 2022 echo. Grade 2/6 murmur at RUSB in the ED. Echo is pending today   Hypertension Patient with elevated BP in the ED today. Has not received home medications yet.  Continue Toprol  XL 25mg  Imdur  15mg  Hold ACEi pending LHC   Hypercholesterolemia Continue Atorvastatin  40mg  and Zetia  10mg      Informed Consent   Shared Decision Making/Informed Consent The risks [stroke (1 in 1000), death (1 in 1000), kidney failure [usually temporary] (1 in 500), bleeding (1 in 200), allergic reaction [possibly serious] (1 in 200)], benefits (diagnostic support and management of coronary artery disease) and alternatives of a cardiac catheterization were discussed in detail with Courtney Olson and she is willing to proceed.      For questions or updates, please contact [facility] Please consult [email or web address] for contact info under        Signed, Leala Prince, PA-C    ATTENDING ATTESTATION:  After conducting a review of all available clinical information with the care team, interviewing the patient, and performing a physical exam, I agree with the findings and plan described in this note.   GEN: No acute distress.   HEENT:  MMM, no JVD, no scleral icterus Cardiac: RRR, 2/6 systolic murmur, rubs, or gallops.  Respiratory: Clear to auscultation bilaterally. GI:  Soft, nontender, non-distended  MS: No edema; No deformity. Neuro:  Nonfocal  Vasc:  +1 R radial pulse, +2 left radial pulse  Patient now asymptomatic after medical therapy.  Her symptoms are quite atypical but each time she is had cardiac catheterization in response to the symptoms a coronary issue or in-stent restenosis has been uncovered.  The patient is quite anxious about the procedure today and how it might affect her travel plans.  I discussed with her that it probably makes sense to proceed with coronary angiography today given her presentation to the  emergency department.  Her right radial access site seems appropriate for access.  Further recommendations will be informed by the results of this test.  The patient has plans to go to [facility] and then on a weeklong cruise for her son's college graduation.  We will have to adjust follow-up based on the results of the cardiac catheterization.  Follow-up read of echocardiogram.   I have reviewed the risks, indications, and alternatives to cardiac catheterization, possible angioplasty, and stenting with the patient. Risks include but are not limited to bleeding, infection, vascular injury, stroke, myocardial infection, arrhythmia, kidney injury, radiation-related injury in the case of prolonged fluoroscopy use, emergency cardiac surgery, and death. The patient understands the risks of serious complication is 1-2 in 1000 with diagnostic cardiac cath and 1-2% or less with angioplasty/stenting.    Courtney Backbone, MD Pager [phone]

## 2023-11-26 NOTE — Plan of Care (Signed)
   Problem: Coping: Goal: Ability to adjust to condition or change in health will improve Outcome: Progressing

## 2023-11-26 NOTE — TOC CM/SW Note (Signed)
 Transition of Care [facility]) - Inpatient Brief Assessment   Patient Details  Name: Courtney Olson MRN: [id number] Date of Birth: 1965/12/16  [facility] of [facility] CM/SW Contact:    Cosimo Diones, RN Phone Number: 11/26/2023, 1:04 PM   Clinical Narrative: Patient presented for shortness of breath and upper back pain. PTA patient was from home with spouse. Patient has insurance and PCP. No home needs identified at this time. Case Manager will continue to follow for additional transition of care needs as the patient progresses.     Transition of Care Asessment: Insurance and Status: Insurance coverage has been reviewed Patient has primary care physician: Yes Home environment has been reviewed: reviewed Prior level of function:: independent Prior/Current Home Services: No current home services Social Drivers of Health Review: SDOH reviewed no interventions necessary Readmission risk has been reviewed: Yes Transition of care needs: no transition of care needs at this time

## 2023-11-26 NOTE — Progress Notes (Addendum)
 PHARMACY - ANTICOAGULATION CONSULT NOTE  Pharmacy Consult for heparin  Indication: chest pain/ACS and atrial fibrillation  Allergies  Allergen Reactions   Orange (Diagnostic) Anaphylaxis, Diarrhea, Hives, Itching, Nausea And Vomiting, Palpitations, Rash, Shortness Of Breath and Swelling   Empagliflozin Other (See Comments)    burning   Metformin Hcl Other (See Comments)    Gi upset   Nitrofurantoin Itching    Ears burning   Bactrim [Sulfamethoxazole-Trimethoprim] Rash    Patient Measurements: Height: 5\' 5"  (165.1 cm) Weight: 93 kg (205 lb) IBW/kg (Calculated) : 57 HEPARIN  DW (KG): 77.8  Vital Signs: Temp: 97.7 F (36.5 C) (05/13 1103) Temp Source: Axillary (05/13 1103) BP: 157/75 (05/13 1103) Pulse Rate: 72 (05/13 1103)  Labs: Recent Labs    11/25/23 0415 11/25/23 0535 11/25/23 2124 11/26/23 0450 11/26/23 1159  HGB 13.5  --   --  12.5  --   HCT 41.1  --   --  37.4  --   PLT 209  --   --  196  --   APTT  --   --  43* 63* 66*  HEPARINUNFRC  --   --  >1.10*  --   --   CREATININE 1.06*  --   --   --   --   TROPONINIHS 7 10  --   --   --     Estimated Creatinine Clearance: 65.2 mL/min (A) (by C-G formula based on SCr of 1.06 mg/dL (H)).   Medical History: Past Medical History:  Diagnosis Date   Aortic calcification (HCC)    Atrial tachycardia (HCC)    Atrophic vaginitis 01/2006   Bradycardia 05/2014   CAD (coronary artery disease)    cardiac cath 05/21/2019 showing 805 mRCA, 60% D1 and D2 that were very small and patent LAD and LCx.  She underwent DES to the [facility] with residual 25% stenosis in the dRCA   Chronic pancolonic ulcerative colitis (HCC) 2015   unknown if chronic.   CKD (chronic kidney disease), stage II    Cytomegaloviral hepatitis (HCC) 2005   Diabetes mellitus without complication (HCC) 01/2006   on Ace inhibitor to protect kidneys   Gallstones 01/2013   GERD (gastroesophageal reflux disease)    Heart failure (HCC) 10/26/2020   Hyperlipidemia     Hypertension    Kidney tumor 03/2013   Right   NSVT (nonsustained ventricular tachycardia) (HCC)    Obesity    OSA (obstructive sleep apnea)    PAF (paroxysmal atrial fibrillation) (HCC)    Pancreatitis 2014   Postmenopausal    Premature atrial contractions    PVC's (premature ventricular contractions)    RA (rheumatoid arthritis) (HCC)    Syncope and collapse    Ulcerative colitis (HCC) 2015    Assessment: 22 YOF presenting with CP/back pain, hx CAD, hx of A fib on Xarelto  PTA with last dose taken 5/11 @1730 , to transition to heparin  gtt for ACS rule out  aPTT came back at low-end of therapeutic range (66 seconds). Per RN, minor bleeding at IV site, no issues with heparin  infusion running continuously. CBC shows Hgb 13.5 > 12.5, plts 209 > 196. Patient also scheduled for LHC today 5/13 at 17:30.  Goal of Therapy:  Heparin  level 0.3-0.7 units/ml aPTT 66-102 seconds Monitor platelets by anticoagulation protocol: Yes   Plan:  Continue heparin  gtt 1400 units/hr F/u post LHC and ability to transition back to Xarelto   Armanda Bern, PharmD, BCPS Clinical Pharmacist 11/26/2023 2:33 PM   ADDENDUM: Cath postponed until  tomorrow. Since aPTT at lower-end of goal, will increase heparin  rate to 1450 units/hr and f/u aPTT and HL in AM.  Armanda Bern, PharmD, BCPS 11/26/2023 7:19 PM

## 2023-11-26 NOTE — Progress Notes (Addendum)
 PHARMACY - ANTICOAGULATION CONSULT NOTE  Pharmacy Consult for heparin  Indication: chest pain/ACS and atrial fibrillation  Allergies  Allergen Reactions   Orange (Diagnostic) Anaphylaxis, Diarrhea, Hives, Itching, Nausea And Vomiting, Palpitations, Rash, Shortness Of Breath and Swelling   Empagliflozin Other (See Comments)    burning   Metformin Hcl Other (See Comments)    Gi upset   Nitrofurantoin Itching    Ears burning   Bactrim [Sulfamethoxazole-Trimethoprim] Rash    Patient Measurements: Height: 5\' 5"  (165.1 cm) Weight: 93 kg (205 lb) IBW/kg (Calculated) : 57 HEPARIN  DW (KG): 77.8  Vital Signs: Temp: 98.5 F (36.9 C) (05/13 0452) Temp Source: Oral (05/13 0452) BP: 145/76 (05/13 0452) Pulse Rate: 66 (05/13 0452)  Labs: Recent Labs    11/25/23 0415 11/25/23 0535 11/25/23 2124 11/26/23 0450  HGB 13.5  --   --  12.5  HCT 41.1  --   --  37.4  PLT 209  --   --  196  APTT  --   --  43* 63*  HEPARINUNFRC  --   --  >1.10*  --   CREATININE 1.06*  --   --   --   TROPONINIHS 7 10  --   --     Estimated Creatinine Clearance: 65.2 mL/min (A) (by C-G formula based on SCr of 1.06 mg/dL (H)).   Medical History: Past Medical History:  Diagnosis Date   Aortic calcification (HCC)    Atrial tachycardia (HCC)    Atrophic vaginitis 01/2006   Bradycardia 05/2014   CAD (coronary artery disease)    cardiac cath 05/21/2019 showing 805 mRCA, 60% D1 and D2 that were very small and patent LAD and LCx.  She underwent DES to the [facility] with residual 25% stenosis in the dRCA   Chronic pancolonic ulcerative colitis (HCC) 2015   unknown if chronic.   CKD (chronic kidney disease), stage II    Cytomegaloviral hepatitis (HCC) 2005   Diabetes mellitus without complication (HCC) 01/2006   on Ace inhibitor to protect kidneys   Gallstones 01/2013   GERD (gastroesophageal reflux disease)    Heart failure (HCC) 10/26/2020   Hyperlipidemia    Hypertension    Kidney tumor 03/2013   Right    NSVT (nonsustained ventricular tachycardia) (HCC)    Obesity    OSA (obstructive sleep apnea)    PAF (paroxysmal atrial fibrillation) (HCC)    Pancreatitis 2014   Postmenopausal    Premature atrial contractions    PVC's (premature ventricular contractions)    RA (rheumatoid arthritis) (HCC)    Syncope and collapse    Ulcerative colitis (HCC) 2015    Assessment: 84 YOF presenting with CP/back pain, hx CAD, hx of A fib on Xarelto  PTA with last dose taken 5/11 @1730 , to transition to heparin  gtt for ACS rule out  aPTT came back subtherapeutic at 63 seconds. Per RN, no signs/symptoms of bleeding or issues with the hepari infusion running continuously. CBC shows Hgb 13.5 > 12.5, plts 209 > 196. Patient also scheduled for LHC today 5/13.  Goal of Therapy:  Heparin  level 0.3-0.7 units/ml aPTT 66-102 seconds Monitor platelets by anticoagulation protocol: Yes   Plan:  Increase heparin  gtt 1400 units/hr F/u 6 hour aPTT F/u post LHC and ability to transition back to Xarelto   Young Hensen, PharmD, BCPS Clinical Pharmacist 11/26/2023 5:57 AM

## 2023-11-27 ENCOUNTER — Encounter (HOSPITAL_COMMUNITY): Payer: Self-pay | Admitting: Cardiovascular Disease

## 2023-11-27 ENCOUNTER — Telehealth (HOSPITAL_COMMUNITY): Payer: Self-pay | Admitting: Pharmacy Technician

## 2023-11-27 ENCOUNTER — Other Ambulatory Visit (HOSPITAL_COMMUNITY): Payer: Self-pay

## 2023-11-27 ENCOUNTER — Encounter (HOSPITAL_COMMUNITY)
Admission: EM | Disposition: A | Discharge status: Home / Self Care | Payer: Self-pay | Source: Home / Self Care | Attending: Emergency Medicine

## 2023-11-27 DIAGNOSIS — I480 Paroxysmal atrial fibrillation: Secondary | ICD-10-CM | POA: Diagnosis not present

## 2023-11-27 DIAGNOSIS — I25110 Atherosclerotic heart disease of native coronary artery with unstable angina pectoris: Secondary | ICD-10-CM | POA: Diagnosis not present

## 2023-11-27 DIAGNOSIS — E1122 Type 2 diabetes mellitus with diabetic chronic kidney disease: Secondary | ICD-10-CM | POA: Diagnosis not present

## 2023-11-27 DIAGNOSIS — E785 Hyperlipidemia, unspecified: Secondary | ICD-10-CM | POA: Diagnosis not present

## 2023-11-27 DIAGNOSIS — I200 Unstable angina: Secondary | ICD-10-CM | POA: Diagnosis not present

## 2023-11-27 HISTORY — PX: LEFT HEART CATH AND CORONARY ANGIOGRAPHY: CATH118249

## 2023-11-27 LAB — HEPARIN LEVEL (UNFRACTIONATED): Heparin Unfractionated: 0.72 [IU]/mL — ABNORMAL HIGH (ref 0.30–0.70)

## 2023-11-27 LAB — CBC
HCT: 33.9 % — ABNORMAL LOW (ref 36.0–46.0)
Hemoglobin: 11.4 g/dL — ABNORMAL LOW (ref 12.0–15.0)
MCH: 30.2 pg (ref 26.0–34.0)
MCHC: 33.6 g/dL (ref 30.0–36.0)
MCV: 89.9 fL (ref 80.0–100.0)
Platelets: 186 10*3/uL (ref 150–400)
RBC: 3.77 MIL/uL — ABNORMAL LOW (ref 3.87–5.11)
RDW: 14.7 % (ref 11.5–15.5)
WBC: 7.6 10*3/uL (ref 4.0–10.5)
nRBC: 0 % (ref 0.0–0.2)

## 2023-11-27 LAB — BASIC METABOLIC PANEL WITH GFR
Anion gap: 8 (ref 5–15)
BUN: 9 mg/dL (ref 6–20)
CO2: 25 mmol/L (ref 22–32)
Calcium: 8.7 mg/dL — ABNORMAL LOW (ref 8.9–10.3)
Chloride: 106 mmol/L (ref 98–111)
Creatinine, Ser: 0.98 mg/dL (ref 0.44–1.00)
GFR, Estimated: >60 mL/min (ref >60)
Glucose, Bld: 148 mg/dL — ABNORMAL HIGH (ref 70–99)
Potassium: 3.9 mmol/L (ref 3.5–5.1)
Sodium: 139 mmol/L (ref 135–145)

## 2023-11-27 LAB — GLUCOSE, CAPILLARY
Glucose-Capillary: 176 mg/dL — ABNORMAL HIGH (ref 70–99)
Glucose-Capillary: 150 mg/dL — ABNORMAL HIGH (ref 70–99)
Glucose-Capillary: 175 mg/dL — ABNORMAL HIGH (ref 70–99)

## 2023-11-27 LAB — APTT: aPTT: 75 s — ABNORMAL HIGH (ref 24–36)

## 2023-11-27 LAB — LIPOPROTEIN A (LPA): Lipoprotein (a): 208.3 nmol/L — ABNORMAL HIGH (ref <75.0)

## 2023-11-27 SURGERY — LEFT HEART CATH AND CORONARY ANGIOGRAPHY
Anesthesia: LOCAL

## 2023-11-27 MED ORDER — HEPARIN SODIUM (PORCINE) 1000 UNIT/ML IJ SOLN
INTRAMUSCULAR | Status: DC | PRN
  Administered 2023-11-27: 4000 [IU] via INTRAVENOUS

## 2023-11-27 MED ORDER — SODIUM CHLORIDE 0.9% FLUSH: 3.0000 mL | INTRAVENOUS | Status: DC | PRN

## 2023-11-27 MED ORDER — VERAPAMIL HCL 2.5 MG/ML IV SOLN
INTRAVENOUS | Status: AC
  Filled 2023-11-27: qty 2

## 2023-11-27 MED ORDER — FENTANYL CITRATE (PF) 100 MCG/2ML IJ SOLN
INTRAMUSCULAR | Status: DC | PRN
  Administered 2023-11-27 (×2): 50 ug via INTRAVENOUS

## 2023-11-27 MED ORDER — HEPARIN (PORCINE) IN NACL 2000-0.9 UNIT/L-% IV SOLN
INTRAVENOUS | Status: DC | PRN
  Administered 2023-11-27: 1000 mL

## 2023-11-27 MED ORDER — FENTANYL CITRATE (PF) 100 MCG/2ML IJ SOLN
INTRAMUSCULAR | Status: AC
  Filled 2023-11-27: qty 2

## 2023-11-27 MED ORDER — ATORVASTATIN CALCIUM 40 MG PO TABS
40.0000 mg | ORAL_TABLET | Freq: Every day | ORAL | Status: DC
  Administered 2023-11-27: 40 mg via ORAL
  Filled 2023-11-27: qty 1

## 2023-11-27 MED ORDER — VERAPAMIL HCL 2.5 MG/ML IV SOLN
INTRAVENOUS | Status: DC | PRN
  Administered 2023-11-27: 10 mL via INTRA_ARTERIAL

## 2023-11-27 MED ORDER — IOHEXOL 350 MG/ML SOLN
INTRAVENOUS | Status: DC | PRN
  Administered 2023-11-27: 35 mL

## 2023-11-27 MED ORDER — ISOSORBIDE MONONITRATE ER 30 MG PO TB24: 30.0000 mg | ORAL_TABLET | Freq: Every day | ORAL | 0 refills | Status: AC

## 2023-11-27 MED ORDER — ISOSORBIDE MONONITRATE ER 30 MG PO TB24: 30.0000 mg | ORAL_TABLET | Freq: Every day | ORAL | Status: DC

## 2023-11-27 MED ORDER — SODIUM CHLORIDE 0.9% FLUSH: 3.0000 mL | Freq: Two times a day (BID) | INTRAVENOUS | Status: DC

## 2023-11-27 MED ORDER — HEPARIN SODIUM (PORCINE) 1000 UNIT/ML IJ SOLN
INTRAMUSCULAR | Status: AC
  Filled 2023-11-27: qty 10

## 2023-11-27 MED ORDER — LIDOCAINE HCL (PF) 1 % IJ SOLN
INTRAMUSCULAR | Status: AC
  Filled 2023-11-27: qty 30

## 2023-11-27 MED ORDER — LIDOCAINE HCL (PF) 1 % IJ SOLN
INTRAMUSCULAR | Status: DC | PRN
  Administered 2023-11-27: 3 mL

## 2023-11-27 MED ORDER — MIDAZOLAM HCL 2 MG/2ML IJ SOLN
INTRAMUSCULAR | Status: DC | PRN
  Administered 2023-11-27 (×2): 1 mg via INTRAVENOUS

## 2023-11-27 MED ORDER — MIDAZOLAM HCL 2 MG/2ML IJ SOLN
INTRAMUSCULAR | Status: AC
  Filled 2023-11-27: qty 2

## 2023-11-27 MED ORDER — SODIUM CHLORIDE 0.9 % IV SOLN: 250.0000 mL | INTRAVENOUS | Status: DC | PRN

## 2023-11-27 MED ORDER — LABETALOL HCL 5 MG/ML IV SOLN: 10.0000 mg | INTRAVENOUS | Status: DC | PRN

## 2023-11-27 SURGICAL SUPPLY — 10 items
CATH DRAGONFLY OPSTAR (CATHETERS) IMPLANT
CATH INFINITI 5 FR JL3.5 (CATHETERS) IMPLANT
CATH INFINITI AMBI 5FR JK (CATHETERS) IMPLANT
DEVICE RAD COMP TR BAND LRG (VASCULAR PRODUCTS) IMPLANT
GLIDESHEATH SLEND SS 6F .021 (SHEATH) IMPLANT
GUIDEWIRE INQWIRE 1.5J.035X260 (WIRE) IMPLANT
KIT ENCORE 26 ADVANTAGE (KITS) IMPLANT
PACK CARDIAC CATHETERIZATION (CUSTOM PROCEDURE TRAY) ×1 IMPLANT
SET ATX-X65L (MISCELLANEOUS) IMPLANT
SHEATH PROBE COVER 6X72 (BAG) IMPLANT

## 2023-11-27 NOTE — Telephone Encounter (Addendum)
 Pharmacy Patient Advocate Encounter   Received notification from Inpatient Request that prior authorization for Farxiga 10 mg is required/requested.   Insurance verification completed.   The patient is insured through [employer] .  Phone Number [phone] ext. [phone] Per test claim: PA required; PA submitted to above mentioned insurance via Phone Key/confirmation #/EOC will be faxing form to fill out Status is pending

## 2023-11-27 NOTE — Telephone Encounter (Signed)
 I have faxed prior authorization form to [phone]

## 2023-11-27 NOTE — H&P (View-Only) (Signed)
   Patient Name: Lyndon Fiss Date of Encounter: 11/27/2023 [facility] Cardiologist: Mavrik Bynum K Shunna Mikaelian, MD   Interval Summary  .    No pain overnight. Frustrated regarding the delay in cath. Hopeful to DC home today.   Vital Signs .    Vitals:   11/26/23 1603 11/26/23 1958 11/27/23 0000 11/27/23 0420  BP: 121/70 110/60 (!) 140/70 125/73  Pulse: 66 73 71 69  Resp: 18 18 18 18   Temp: 99.8 F (37.7 C) 99.6 F (37.6 C) 99.3 F (37.4 C) 99.2 F (37.3 C)  TempSrc: Oral Oral Oral Oral  SpO2:  98%  97%  Weight:      Height:        Intake/Output Summary (Last 24 hours) at 11/27/2023 0733 Last data filed at 11/27/2023 0700 Gross per 24 hour  Intake 681.16 ml  Output --  Net 681.16 ml      11/25/2023    4:10 AM 11/20/2023    3:33 PM 03/26/2023    9:28 AM  Last 3 Weights  Weight (lbs) 205 lb 211 lb 6.4 oz 207 lb  Weight (kg) 92.987 kg 95.89 kg 93.895 kg      Telemetry/ECG    Sinus Rhythm - Personally Reviewed  Physical Exam .    GEN: No acute distress.   Neck: No JVD Cardiac: RRR, soft systolic murmur, no rubs, or gallops.  Respiratory: Clear to auscultation bilaterally. GI: Soft, nontender, non-distended  MS: No edema  Assessment & Plan .     58 y.o. female with history of CAD with prior stenting, paroxysmal afib, carotid artery disease, aortic atherosclerosis, hypertension, DM (on insulin), hyperlipidemia, OSA, bradycardia, vasovagal syncope, pancreatitis, UC, RA who was seen 11/25/2023 for the evaluation of back pain felt to be the same as experienced with prior CAD/stenting.   CAD Chest pain -- prior stenting of RCA 2020 and 2022, with DES to [facility] 02/2022 2/2 ISR. Presented with NTG responsive chest/scapular pain. hsTn negative -- planned for cardiac cath today -- continue ASA, plavix ,  Toprol  XL, atorvastatin , Imdur    Paroxysmal atrial fib -- remains in SR -- Xarelto  held, on IV heparin    HTN -- controlled -- continue Toprol  XL 25mg  daily, Imdur   15mg  daily (ACE held with plans for cath)   HLD -- LDL 49, HDL 65, LP(a) 208 -- continue atorvastatin  80mg  daily, Zetia  10mg  daily   Aortic valve calcification -- moderate calcification on echo 5/13  DM -- Hgb A1c 6.5 -- SSI, semglee while inpatient  -- could consider addition of SLGT2 prior to discharge   For questions or updates, please contact [facility] Please consult [email or web address] for contact info under        Signed, Johnie Nailer, NP    ATTENDING ATTESTATION:  After conducting a review of all available clinical information with the care team, interviewing the patient, and performing a physical exam, I agree with the findings and plan described in this note.   GEN: No acute distress.   HEENT:  MMM, no JVD, no scleral icterus Cardiac: RRR, no murmurs, rubs, or gallops.  Respiratory: Clear to auscultation bilaterally. GI: Soft, nontender, non-distended  MS: No edema; No deformity. Neuro:  Nonfocal  Vasc:  +2 radial pulses  Patient remains stable without recurrent anginal symptoms.  Awaiting cath today.  If reassuring, then D/C later today.  Agree with starting SLGT2i prior to discharge.  Alyssa Backbone, MD Pager [phone]

## 2023-11-27 NOTE — Progress Notes (Addendum)
   Patient Name: Courtney Olson Date of Encounter: 11/27/2023 [facility] Cardiologist: Mavrik Bynum K Shunna Mikaelian, MD   Interval Summary  .    No pain overnight. Frustrated regarding the delay in cath. Hopeful to DC home today.   Vital Signs .    Vitals:   11/26/23 1603 11/26/23 1958 11/27/23 0000 11/27/23 0420  BP: 121/70 110/60 (!) 140/70 125/73  Pulse: 66 73 71 69  Resp: 18 18 18 18   Temp: 99.8 F (37.7 C) 99.6 F (37.6 C) 99.3 F (37.4 C) 99.2 F (37.3 C)  TempSrc: Oral Oral Oral Oral  SpO2:  98%  97%  Weight:      Height:        Intake/Output Summary (Last 24 hours) at 11/27/2023 0733 Last data filed at 11/27/2023 0700 Gross per 24 hour  Intake 681.16 ml  Output --  Net 681.16 ml      11/25/2023    4:10 AM 11/20/2023    3:33 PM 03/26/2023    9:28 AM  Last 3 Weights  Weight (lbs) 205 lb 211 lb 6.4 oz 207 lb  Weight (kg) 92.987 kg 95.89 kg 93.895 kg      Telemetry/ECG    Sinus Rhythm - Personally Reviewed  Physical Exam .    GEN: No acute distress.   Neck: No JVD Cardiac: RRR, soft systolic murmur, no rubs, or gallops.  Respiratory: Clear to auscultation bilaterally. GI: Soft, nontender, non-distended  MS: No edema  Assessment & Plan .     58 y.o. female with history of CAD with prior stenting, paroxysmal afib, carotid artery disease, aortic atherosclerosis, hypertension, DM (on insulin), hyperlipidemia, OSA, bradycardia, vasovagal syncope, pancreatitis, UC, RA who was seen 11/25/2023 for the evaluation of back pain felt to be the same as experienced with prior CAD/stenting.   CAD Chest pain -- prior stenting of RCA 2020 and 2022, with DES to [facility] 02/2022 2/2 ISR. Presented with NTG responsive chest/scapular pain. hsTn negative -- planned for cardiac cath today -- continue ASA, plavix ,  Toprol  XL, atorvastatin , Imdur    Paroxysmal atrial fib -- remains in SR -- Xarelto  held, on IV heparin    HTN -- controlled -- continue Toprol  XL 25mg  daily, Imdur   15mg  daily (ACE held with plans for cath)   HLD -- LDL 49, HDL 65, LP(a) 208 -- continue atorvastatin  80mg  daily, Zetia  10mg  daily   Aortic valve calcification -- moderate calcification on echo 5/13  DM -- Hgb A1c 6.5 -- SSI, semglee while inpatient  -- could consider addition of SLGT2 prior to discharge   For questions or updates, please contact [facility] Please consult [email or web address] for contact info under        Signed, Johnie Nailer, NP    ATTENDING ATTESTATION:  After conducting a review of all available clinical information with the care team, interviewing the patient, and performing a physical exam, I agree with the findings and plan described in this note.   GEN: No acute distress.   HEENT:  MMM, no JVD, no scleral icterus Cardiac: RRR, no murmurs, rubs, or gallops.  Respiratory: Clear to auscultation bilaterally. GI: Soft, nontender, non-distended  MS: No edema; No deformity. Neuro:  Nonfocal  Vasc:  +2 radial pulses  Patient remains stable without recurrent anginal symptoms.  Awaiting cath today.  If reassuring, then D/C later today.  Agree with starting SLGT2i prior to discharge.  Alyssa Backbone, MD Pager [phone]

## 2023-11-27 NOTE — Telephone Encounter (Signed)
 Patient Product/process development scientist completed.    The patient is insured through [facility]. Patient has [employer], may use a copay card, and/or apply for patient assistance if available.    Ran test claim for Farxiga 10 mg and Requires Prior Authorization  Ran test claim for Jardiance 10 mg and Requires Prior Authorization  This test claim was processed through [employer]- copay amounts may vary at other pharmacies due to [employer], or as the patient moves through the different stages of their insurance plan.     Morgan Arab, CPHT Pharmacy Technician III Certified Patient Advocate [facility] Patient Advocate Team Direct Number: [phone]  Fax: [phone]

## 2023-11-27 NOTE — Discharge Summary (Addendum)
Discharge Summary    Patient ID: Courtney Olson MRN: [id number]; DOB: Aug 14, 1965  Admit date: 11/25/2023 Discharge date: 11/27/2023  PCP:  Wyn Heater, MD   [facility] Providers Cardiologist:  Kyra Phy, MD  Cardiology APP:  Gabino Joe, PA-C    Discharge Diagnoses    Principal Problem:   Accelerating angina [facility]) Active Problems:   Diabetes mellitus without complication (HCC)   Hyperlipidemia   Hypertension   PAF (paroxysmal atrial fibrillation) [facility])  Diagnostic Studies/Procedures    Cath: 11/27/2023    Ost Cx to Mid Cx lesion is 25% stenosed.   Dist RCA lesion is 25% stenosed.   2nd Diag lesion is 60% stenosed.   1st Diag lesion is 60% stenosed.   Prox RCA lesion is 95% stenosed.   Mid RCA-2 lesion is 100% stenosed.   Mid LAD lesion is 30% stenosed.   Non-stenotic Mid RCA-3 lesion was previously treated.   Non-stenotic Mid RCA-1 lesion was previously treated.   1.  Moderately to severely calcified coronary arteries with occluded mid RCA stents with left-to-right collaterals.  Stable moderate diagonal disease. 2.  Left ventricular angiography was not performed.  EF was normal by echo.  Moderately elevated left ventricular end-diastolic pressure at 20 mmHg in the setting of elevated systemic pressure.   Recommendations: Recommend medical therapy for coronary artery disease and chronically occluded RCA with collaterals.  I increased Imdur  to 30 mg daily. Xarelto  can be resumed tomorrow.  Diagnostic Dominance: Right   Echo: 11/26/2023  IMPRESSIONS     1. Left ventricular ejection fraction, by estimation, is 65 to 70%. The  left ventricle has normal function. The left ventricle has no regional  wall motion abnormalities. Left ventricular diastolic parameters are  consistent with Grade I diastolic  dysfunction (impaired relaxation).   2. Right ventricular systolic function is hyperdynamic. The right  ventricular size is normal.   3. The  mitral valve is normal in structure. No evidence of mitral valve  regurgitation. No evidence of mitral stenosis.   4. The aortic valve is tricuspid. There is moderate calcification of the  aortic valve. Aortic valve regurgitation is not visualized. Aortic valve  sclerosis/calcification is present, without any evidence of aortic  stenosis.   5. Aortic dilatation noted. There is borderline dilatation of the aortic  arch, measuring 33 mm.   6. The inferior vena cava is normal in size with greater than 50%  respiratory variability, suggesting right atrial pressure of 3 mmHg.   Comparison(s): Prior images reviewed side by side. Subcostal imaging from  prior study is superior; cannot exclude small PFO from this study.   FINDINGS   Left Ventricle: No 3D or strain transmitted. Left ventricular ejection  fraction, by estimation, is 65 to 70%. The left ventricle has normal  function. The left ventricle has no regional wall motion abnormalities.  Strain was performed and the global  longitudinal strain is indeterminate. The left ventricular internal cavity  size was normal in size. There is no left ventricular hypertrophy. Left  ventricular diastolic parameters are consistent with Grade I diastolic  dysfunction (impaired relaxation).   Right Ventricle: The right ventricular size is normal. No increase in  right ventricular wall thickness. Right ventricular systolic function is  hyperdynamic.   Left Atrium: Left atrial size was normal in size.   Right Atrium: Right atrial size was normal in size.   Pericardium: There is no evidence of pericardial effusion.   Mitral Valve: The mitral valve  is normal in structure. No evidence of  mitral valve regurgitation. No evidence of mitral valve stenosis.   Tricuspid Valve: The tricuspid valve is normal in structure. Tricuspid  valve regurgitation is not demonstrated. No evidence of tricuspid  stenosis.   Aortic Valve: The aortic valve is tricuspid.  There is moderate  calcification of the aortic valve. Aortic valve regurgitation is not  visualized. Aortic valve sclerosis/calcification is present, without any  evidence of aortic stenosis.   Pulmonic Valve: The pulmonic valve was not well visualized. Pulmonic valve  regurgitation is not visualized. No evidence of pulmonic stenosis.   Aorta: The aortic root and ascending aorta are structurally normal, with  no evidence of dilitation and aortic dilatation noted. There is borderline  dilatation of the aortic arch, measuring 33 mm.   Pulmonary Artery: The pulmonary artery is of normal size.   Venous: The inferior vena cava is normal in size with greater than 50%  respiratory variability, suggesting right atrial pressure of 3 mmHg.   IAS/Shunts: The atrial septum is grossly normal.   Additional Comments: 3D was performed not requiring image post processing  on an independent workstation and was indeterminate.   _____________   History of Present Illness     Courtney Olson is a 58 y.o. female with history of CAD with prior stenting, paroxysmal afib, carotid artery disease, aortic atherosclerosis, hypertension, DM (on insulin), hyperlipidemia, OSA, bradycardia, vasovagal syncope, pancreatitis, UC, RA who is being seen 11/25/2023 for the evaluation of back pain felt to be the same as experienced with prior CAD/stenting. Ms. Ridenhour was recently seen in clinic by Marlyse Single PA-C and reported weakness, exertional intolerance, left scapular pain (has previously been her anginal equivalent). This pain was reportedly responsive to nitroglycerin . Patient also reported intermittent lightheadedness. Due to these symptoms, an outpatient stress test as well as echocardiogram were ordered. Patient reported recurrent discomfort in scapula area as well as more lightheadedness over the weekend. Pain again response to nitroglycerin . Given that her symptoms seemed to mimic those felt prior to her last LHC,  patient presented to the ED. In addition to scapula pain, she also notes intermittent hypotension over the weekend, is concerned about medication dosing.    Patient admitted to significant emotional stress in the last few days. Her father in law passed away over the weekend in [place]. Additionally, her husband was currently out of the country and patient has been sleeping irregularly due to time difference/trying to stay in touch with him. She has a trip/cruise planned starting Friday and worries about her ability to go on this trip.    Continues with some scapular pain in the ED. Admitted to cardiology for further management with plans for cardiac cath.   Hospital Course     CAD Chest pain -- prior stenting of RCA 2020 and 2022, with DES to [place] 02/2022 2/2 ISR. Presented with NTG responsive chest/scapular pain. hsTn negative -- Underwent cardiac catheterization with moderately to severely calcified coronary arteries including occluded mid RCA stents with left-to-right collaterals.  Recommendations for continued medical therapy.  Imdur  was increased to 30 mg daily at bedtime. -- Per MD will stop Plavix , resume only Xarelto  tomorrow -- Continue Toprol  XL, atorvastatin , Imdur     Paroxysmal atrial fib -- remains in SR, continue Toprol  XL -- Resume Xarelto  5/15  HTN -- controlled -- continue Toprol  XL 25mg  daily, Imdur  30mg  daily at bedtime, resume lisinopril at discharge  HLD -- LDL 49, HDL 65, LP(a) 208 -- continue atorvastatin   80mg  daily, Zetia  10mg  daily    Aortic valve calcification -- moderate calcification on echo 5/13   DM -- Hgb A1c 6.5 -- Resume home insulin  Patient was seen by Dr. Lorie Rook and deemed stable for discharge home. Follow up arranged in the office.   Did the patient have an acute coronary syndrome (MI, NSTEMI, STEMI, etc) this admission?:  No                               Did the patient have a percutaneous coronary intervention (stent / angioplasty)?:  No.     The patient will be scheduled for a TOC follow up appointment in 10-14 days.   _____________  Discharge Vitals Blood pressure 129/67, pulse 70, temperature 99.4 F (37.4 C), temperature source Oral, resp. rate 18, height 5\' 5"  (1.651 m), weight 93 kg, last menstrual period 07/16/2000, SpO2 92%.  Filed Weights   11/25/23 0410  Weight: 93 kg    Labs & Radiologic Studies    CBC Recent Labs    11/26/23 0450 11/27/23 1258  WBC 6.2 7.6  HGB 12.5 11.4*  HCT 37.4 33.9*  MCV 89.5 89.9  PLT 196 186   Basic Metabolic Panel Recent Labs    29/56/21 0415 11/27/23 1258  NA 139 139  K 4.0 3.9  CL 101 106  CO2 26 25  GLUCOSE 86 148*  BUN 34* 9  CREATININE 1.06* 0.98  CALCIUM  9.9 8.7*   Liver Function Tests Recent Labs    11/25/23 0415  AST 34  ALT 39  ALKPHOS 75  BILITOT 0.7  PROT 8.0  ALBUMIN 4.2   Recent Labs    11/25/23 0415  LIPASE 48   High Sensitivity Troponin:   Recent Labs  Lab 11/25/23 0415 11/25/23 0535  TROPONINIHS 7 10    BNP Invalid input(s): "POCBNP" D-Dimer No results for input(s): "DDIMER" in the last 72 hours. Hemoglobin A1C Recent Labs    11/25/23 0415  HGBA1C 6.5*   Fasting Lipid Panel Recent Labs    11/25/23 0415  CHOL 133  HDL 65  LDLCALC 49  TRIG 97  CHOLHDL 2.0   Thyroid Function Tests Recent Labs    11/25/23 0415  TSH 4.590*   _____________  CARDIAC CATHETERIZATION Result Date: 11/27/2023   Ost Cx to Mid Cx lesion is 25% stenosed.   Dist RCA lesion is 25% stenosed.   2nd Diag lesion is 60% stenosed.   1st Diag lesion is 60% stenosed.   Prox RCA lesion is 95% stenosed.   Mid RCA-2 lesion is 100% stenosed.   Mid LAD lesion is 30% stenosed.   Non-stenotic Mid RCA-3 lesion was previously treated.   Non-stenotic Mid RCA-1 lesion was previously treated. 1.  Moderately to severely calcified coronary arteries with occluded mid RCA stents with left-to-right collaterals.  Stable moderate diagonal disease. 2.  Left ventricular  angiography was not performed.  EF was normal by echo.  Moderately elevated left ventricular end-diastolic pressure at 20 mmHg in the setting of elevated systemic pressure. Recommendations: Recommend medical therapy for coronary artery disease and chronically occluded RCA with collaterals.  I increased Imdur  to 30 mg daily. Xarelto  can be resumed tomorrow.   ECHOCARDIOGRAM COMPLETE Result Date: 11/26/2023    ECHOCARDIOGRAM REPORT   Patient Name:   [facility] Date of Exam: 11/26/2023 Medical Rec #:  [id number]      Height:  65.0 in Accession #:    [id number]     Weight:       205.0 lb Date of Birth:  1966/06/26      BSA:          1.999 m Patient Age:    58 years       BP:           167/75 mmHg Patient Gender: F              HR:           72 bpm. Exam Location:  Inpatient Procedure: 2D Echo, Cardiac Doppler, Color Doppler and Intracardiac            Opacification Agent (Both Spectral and Color Flow Doppler were            utilized during procedure). Indications:    Chest Pain  History:        Patient has prior history of Echocardiogram examinations, most                 recent 07/07/2021. CAD, Arrythmias:Atrial Fibrillation; Risk                 Factors:Hypertension, Dyslipidemia, Diabetes and Sleep Apnea.  Sonographer:    Janette Medley Referring Phys: [id number] Leala Prince IMPRESSIONS  1. Left ventricular ejection fraction, by estimation, is 65 to 70%. The left ventricle has normal function. The left ventricle has no regional wall motion abnormalities. Left ventricular diastolic parameters are consistent with Grade I diastolic dysfunction (impaired relaxation).  2. Right ventricular systolic function is hyperdynamic. The right ventricular size is normal.  3. The mitral valve is normal in structure. No evidence of mitral valve regurgitation. No evidence of mitral stenosis.  4. The aortic valve is tricuspid. There is moderate calcification of the aortic valve. Aortic valve regurgitation is not visualized.  Aortic valve sclerosis/calcification is present, without any evidence of aortic stenosis.  5. Aortic dilatation noted. There is borderline dilatation of the aortic arch, measuring 33 mm.  6. The inferior vena cava is normal in size with greater than 50% respiratory variability, suggesting right atrial pressure of 3 mmHg. Comparison(s): Prior images reviewed side by side. Subcostal imaging from prior study is superior; cannot exclude small PFO from this study. FINDINGS  Left Ventricle: No 3D or strain transmitted. Left ventricular ejection fraction, by estimation, is 65 to 70%. The left ventricle has normal function. The left ventricle has no regional wall motion abnormalities. Strain was performed and the global longitudinal strain is indeterminate. The left ventricular internal cavity size was normal in size. There is no left ventricular hypertrophy. Left ventricular diastolic parameters are consistent with Grade I diastolic dysfunction (impaired relaxation). Right Ventricle: The right ventricular size is normal. No increase in right ventricular wall thickness. Right ventricular systolic function is hyperdynamic. Left Atrium: Left atrial size was normal in size. Right Atrium: Right atrial size was normal in size. Pericardium: There is no evidence of pericardial effusion. Mitral Valve: The mitral valve is normal in structure. No evidence of mitral valve regurgitation. No evidence of mitral valve stenosis. Tricuspid Valve: The tricuspid valve is normal in structure. Tricuspid valve regurgitation is not demonstrated. No evidence of tricuspid stenosis. Aortic Valve: The aortic valve is tricuspid. There is moderate calcification of the aortic valve. Aortic valve regurgitation is not visualized. Aortic valve sclerosis/calcification is present, without any evidence of aortic stenosis. Pulmonic Valve: The pulmonic valve was not well visualized. Pulmonic valve regurgitation is not visualized.  No evidence of pulmonic  stenosis. Aorta: The aortic root and ascending aorta are structurally normal, with no evidence of dilitation and aortic dilatation noted. There is borderline dilatation of the aortic arch, measuring 33 mm. Pulmonary Artery: The pulmonary artery is of normal size. Venous: The inferior vena cava is normal in size with greater than 50% respiratory variability, suggesting right atrial pressure of 3 mmHg. IAS/Shunts: The atrial septum is grossly normal. Additional Comments: 3D was performed not requiring image post processing on an independent workstation and was indeterminate.  LEFT VENTRICLE PLAX 2D LVIDd:         4.40 cm   Diastology LVIDs:         2.80 cm   LV e' medial:    4.90 cm/s LV PW:         0.90 cm   LV E/e' medial:  12.5 LV IVS:        0.90 cm   LV e' lateral:   8.49 cm/s LVOT diam:     2.10 cm   LV E/e' lateral: 7.2 LV SV:         89 LV SV Index:   44 LVOT Area:     3.46 cm  RIGHT VENTRICLE            IVC RV S prime:     9.90 cm/s  IVC diam: 1.40 cm TAPSE (M-mode): 2.2 cm LEFT ATRIUM             Index        RIGHT ATRIUM           Index LA diam:        3.00 cm 1.50 cm/m   RA Area:     14.30 cm LA Vol (A2C):   25.7 ml 12.86 ml/m  RA Volume:   34.50 ml  17.26 ml/m LA Vol (A4C):   40.2 ml 20.11 ml/m LA Biplane Vol: 25.7 ml 12.86 ml/m  AORTIC VALVE LVOT Vmax:   127.00 cm/s LVOT Vmean:  82.100 cm/s LVOT VTI:    0.256 m  AORTA Ao Root diam: 3.00 cm Ao Asc diam:  3.20 cm MITRAL VALVE MV Area (PHT): 3.65 cm    SHUNTS MV Decel Time: 208 msec    Systemic VTI:  0.26 m MV E velocity: 61.20 cm/s  Systemic Diam: 2.10 cm MV A velocity: 62.60 cm/s MV E/A ratio:  0.98 Gloriann Larger MD Electronically signed by Gloriann Larger MD Signature Date/Time: 11/26/2023/11:21:36 AM    Final    DG Chest 2 View Result Date: 11/25/2023 CLINICAL DATA:  Chest pain. EXAM: CHEST - 2 VIEW COMPARISON:  08/29/2022 FINDINGS: The lungs are clear without focal pneumonia, edema, or pneumothorax. Lateral film demonstrates tiny  bilateral pleural effusions. Interstitial markings are diffusely coarsened with chronic features. The cardiopericardial silhouette is within normal limits for size. No acute bony abnormality. Telemetry leads overlie the chest. IMPRESSION: 1. Tiny bilateral pleural effusions. 2. Chronic interstitial coarsening. Electronically Signed   By: Donnal Fusi M.D.   On: 11/25/2023 05:42   Disposition   Pt is being discharged home today in good condition.  Follow-up Plans & Appointments     Discharge Instructions     Call MD for:  difficulty breathing, headache or visual disturbances   Complete by: As directed    Call MD for:  persistant dizziness or light-headedness   Complete by: As directed    Call MD for:  redness, tenderness, or signs of infection (pain, swelling, redness,  odor or green/yellow discharge around incision site)   Complete by: As directed    Diet - low sodium heart healthy   Complete by: As directed    Discharge instructions   Complete by: As directed    Radial Site Care Refer to this sheet in the next few weeks. These instructions provide you with information on caring for yourself after your procedure. Your caregiver may also give you more specific instructions. Your treatment has been planned according to current medical practices, but problems sometimes occur. Call your caregiver if you have any problems or questions after your procedure. HOME CARE INSTRUCTIONS You may shower the day after the procedure. Remove the bandage (dressing) and gently wash the site with plain soap and water. Gently pat the site dry.  Do not apply powder or lotion to the site.  Do not submerge the affected site in water for 3 to 5 days.  Inspect the site at least twice daily.  Do not flex or bend the affected arm for 24 hours.  No lifting over 5 pounds (2.3 kg) for 5 days after your procedure.  Do not drive home if you are discharged the same day of the procedure. Have someone else drive you.   You may drive 24 hours after the procedure unless otherwise instructed by your caregiver.  What to expect: Any bruising will usually fade within 1 to 2 weeks.  Blood that collects in the tissue (hematoma) may be painful to the touch. It should usually decrease in size and tenderness within 1 to 2 weeks.  SEEK IMMEDIATE MEDICAL CARE IF: You have unusual pain at the radial site.  You have redness, warmth, swelling, or pain at the radial site.  You have drainage (other than a small amount of blood on the dressing).  You have chills.  You have a fever or persistent symptoms for more than 72 hours.  You have a fever and your symptoms suddenly get worse.  Your arm becomes pale, cool, tingly, or numb.  You have heavy bleeding from the site. Hold pressure on the site.   Increase activity slowly   Complete by: As directed         Discharge Medications   Allergies as of 11/27/2023       Reactions   Orange (diagnostic) Anaphylaxis, Diarrhea, Hives, Itching, Nausea And Vomiting, Palpitations, Rash, Shortness Of Breath, Swelling   Empagliflozin Other (See Comments)   burning   Metformin Hcl Other (See Comments)   Gi upset   Nitrofurantoin Itching   Ears burning   Bactrim [sulfamethoxazole-trimethoprim] Rash        Medication List     STOP taking these medications    clopidogrel  75 MG tablet Commonly known as: PLAVIX    estradiol  0.1 MG/GM vaginal cream Commonly known as: ESTRACE  VAGINAL       TAKE these medications    ALPRAZolam  0.25 MG tablet Commonly known as: XANAX  Take 1 tablet (0.25 mg total) by mouth 2 (two) times daily as needed. What changed:  how much to take reasons to take this   atorvastatin  40 MG tablet Commonly known as: LIPITOR Take 1 tablet (40 mg total) by mouth daily.   EPINEPHrine  0.3 mg/0.3 mL Soaj injection Commonly known as: EPI-PEN Inject 0.3 mLs into the skin as needed.   ezetimibe  10 MG tablet Commonly known as: ZETIA  Take 1 tablet (10  mg total) by mouth daily.   fluticasone 50 MCG/ACT nasal spray Commonly known as: FLONASE Place 1 spray  into both nostrils daily as needed for allergies.   furosemide  20 MG tablet Commonly known as: LASIX  Take 1 tablet (20 mg total) by mouth as needed for fluid or edema.   HumaLOG KwikPen 100 UNIT/ML KwikPen Generic drug: insulin lispro Inject 20 Units into the skin in the morning and at bedtime.   hydrocortisone 2.5 % cream Apply 1 Application topically as needed (for itching).   isosorbide  mononitrate 30 MG 24 hr tablet Commonly known as: IMDUR  Take 1 tablet (30 mg total) by mouth at bedtime.   ketoconazole 2 % cream Commonly known as: NIZORAL Apply 1 Application topically 2 (two) times daily as needed.   magnesium  oxide 400 MG tablet Commonly known as: MAG-OX Take 1 tablet (400 mg total) by mouth in the morning, at noon, and at bedtime. What changed: when to take this   mesalamine  1.2 g EC tablet Commonly known as: LIALDA  Take 1.2 g by mouth daily.   metoprolol  succinate 25 MG 24 hr tablet Commonly known as: TOPROL -XL Take 1 tablet (25 mg total) by mouth daily.   multivitamin tablet Take 1 tablet by mouth daily. Pack Heart healthy   nitroGLYCERIN  0.4 MG SL tablet Commonly known as: Nitrostat  Place 1 tablet (0.4 mg total) under the tongue every 5 (five) minutes as needed.   ramipril  2.5 MG capsule Commonly known as: ALTACE  Take 1 capsule (2.5 mg total) by mouth daily.   rivaroxaban  20 MG Tabs tablet Commonly known as: Xarelto  Take 1 tablet (20 mg total) by mouth daily.   Toujeo SoloStar 300 UNIT/ML Solostar Pen Generic drug: insulin glargine  (1 Unit Dial) Inject 20 Units into the skin in the morning and at bedtime.   Trulicity  0.75 MG/0.5ML Soaj Generic drug: Dulaglutide  Inject 0.75 mg into the skin every Monday.   VITAMIN B12 PO Take 1 tablet by mouth daily.   VITAMIN D3 PO Take 1 capsule by mouth daily.         Outstanding Labs/Studies    N/a   Duration of Discharge Encounter: APP Time: 20 minutes   Signed, Johnie Nailer, NP 11/27/2023, 4:14 PM   ATTENDING ATTESTATION:  After conducting a review of all available clinical information with the care team, interviewing the patient, and performing a physical exam, I agree with the findings and plan described in this note.   GEN: No acute distress.   HEENT:  MMM, no JVD, no scleral icterus Cardiac: RRR, no murmurs, rubs, or gallops.  Respiratory: Clear to auscultation bilaterally. GI: Soft, nontender, non-distended  MS: No edema; No deformity. Neuro:  Nonfocal  Vasc:  TR band in place  I viewed the patient's coronary angiography study today which demonstrated not surprisingly occluded stents in the right coronary artery.  This right coronary artery has 2 layers of stent and has sustained multiple stent failures.  Over the course of 25 minutes I reviewed the angiographic findings with the patient and their implications including the need for medical therapy to control anginal symptoms.  We discussed the need for Imdur .  She had been on this in the past and had experience headaches so we discussed adjusting her dosing regimen to a nighttime dosing.  I reviewed the need for routine cardiology follow-up and that it is reasonable for her to travel out of state for a family trip.  All questions and concerns were addressed.  Stable for discharge home today.  Will discontinue Plavix  and resume Xarelto  tomorrow.  APP discharge time:20 MD discharge time:25  Alyssa Backbone, MD Pager  [phone]   

## 2023-11-27 NOTE — Interval H&P Note (Signed)
 History and Physical Interval Note:  11/27/2023 1:52 PM  Courtney Olson  has presented today for surgery, with the diagnosis of cad.  The various methods of treatment have been discussed with the patient and family. After consideration of risks, benefits and other options for treatment, the patient has consented to  Procedure(s): LEFT HEART CATH AND CORONARY ANGIOGRAPHY (N/A) CORONARY IMAGING/OCT (N/A) CORONARY STENT INTERVENTION (N/A) as a surgical intervention.  The patient's history has been reviewed, patient examined, no change in status, stable for surgery.  I have reviewed the patient's chart and labs.  Questions were answered to the patient's satisfaction.     Alysen Smylie

## 2023-11-27 NOTE — Plan of Care (Signed)
   Problem: Health Behavior/Discharge Planning: Goal: Ability to manage health-related needs will improve Outcome: Progressing

## 2023-11-28 ENCOUNTER — Ambulatory Visit (HOSPITAL_COMMUNITY)

## 2023-11-28 ENCOUNTER — Other Ambulatory Visit: Payer: Self-pay | Admitting: Physician Assistant

## 2023-11-28 ENCOUNTER — Other Ambulatory Visit (HOSPITAL_COMMUNITY): Payer: Self-pay

## 2023-11-28 DIAGNOSIS — I480 Paroxysmal atrial fibrillation: Secondary | ICD-10-CM

## 2023-11-28 DIAGNOSIS — R011 Cardiac murmur, unspecified: Secondary | ICD-10-CM

## 2023-11-28 DIAGNOSIS — I2510 Atherosclerotic heart disease of native coronary artery without angina pectoris: Secondary | ICD-10-CM

## 2023-11-29 ENCOUNTER — Other Ambulatory Visit (HOSPITAL_COMMUNITY): Payer: Self-pay

## 2023-11-30 ENCOUNTER — Other Ambulatory Visit (HOSPITAL_COMMUNITY): Payer: Self-pay

## 2023-11-30 ENCOUNTER — Other Ambulatory Visit (HOSPITAL_BASED_OUTPATIENT_CLINIC_OR_DEPARTMENT_OTHER): Payer: Self-pay

## 2023-12-02 ENCOUNTER — Other Ambulatory Visit (HOSPITAL_COMMUNITY): Payer: Self-pay

## 2023-12-02 NOTE — Telephone Encounter (Signed)
 Pharmacy Patient Advocate Encounter  Received notification from [employer] that Prior Authorization for Farxiga 10 mg has been APPROVED from 11/27/2023 to 05/29/2024. Ran test claim, Copay is $35.00. This test claim was processed through [facility]- copay amounts may vary at other pharmacies due to pharmacy/plan contracts, or as the patient moves through the different stages of their insurance plan.   PA #/Case ID/Reference #: [id number]

## 2023-12-10 NOTE — Assessment & Plan Note (Signed)
 History of DES to the RCA November 2020 and subsequent DES to the RCA in 2022 secondary to in-stent restenosis.  She underwent DES to mid RCA and shockwave lithotripsy to the mid RCA due to in-stent restenosis in August 2023. She was recently admitted with unstable angina. Troponins were negative. EF was normal on TTE.  Cardiac catheterization demonstrated occluded mid RCA stents with left-right collaterals. She had mild to moderate nonobstructive disease elsewhere. Medical Rx was recommended. She has had several interventions to the RCA due to in-stent restenosis. We discussed that managing this medically is the best long term option.  - Continue atorvastatin  40 mg daily - Continue Imdur  30 mg once daily, Metoprolol  succinate 25 mg once daily, NTG prn - Continue Xarelto  20 mg once daily  - Continue Ramipril  2.5 mg once daily  - Follow up 6 mos

## 2023-12-10 NOTE — Progress Notes (Unsigned)
 OFFICE NOTE Date:  12/11/2023  ID:  Otho Blitz, DOB 25-Jul-1965, MRN [id number] PCP: Wyn Heater, MD  [facility] Providers Cardiologist:  Kyra Phy, MD Cardiology APP:  Gabino Joe, PA-C     Patient Profile:     Coronary artery disease  S/p DES to RCA in 05/2019; S/p DES to RCA in 06/2021 due to ISR; S/p 3.5 x 12 mm Synergy DES to [facility] and shockwave lithotripsy to mRCA ISR in 02/2022 TTE 11/26/23: EF 65-70, no RWMA, Gr 1 DD, mod AV Ca2+, AV sclerosis, borderline dilation of aortic arch 33 mm LHC 11/27/23: mLAD 30, D1 60, D2 60; oLCx 25; pRCA 95, mRCA stents 100 w L-R collats >> Med Rx Paroxysmal atrial fibrillation  Monitor 07/2021: no AFib Carotid artery disease US  07/05/2023: R 1-39; L 40-59 Aortic atherosclerosis  Hypertension  Diabetes mellitus, insulin  dependent  Hyperlipidemia  Lp(a) 208 OSA Bradycardia Hx of vasovagal syncope  Hx of pancreatitis  S/p Renal mass excision (benign) Ulcerative colitis  Hx of CMV hepatitis  Rheumatoid arthritis        Discussed the use of AI scribe software for clinical note transcription with the patient, who gave verbal consent to proceed. History of Present Illness Courtney Olson is a 58 y.o. female who returns for post hospital follow up. She was seen recently 11/20/23 and noted a recent episode of L scapular pain (anginal equivalent) but no recurrence. Stress MPI, TTE were arranged. However, she was admitted 5/12-5/14 with worsening anginal symptoms. She was under increased stress. Her husband had left for [place] to bring his father back to the [place]  after a severe accident. Her father in law subsequently passed away. Her hsTroponins were neg. She underwent echocardiogram which showed normal EF. Cardiac catheterization was arranged and demonstrated occluded mid RCA stents with L to R collaterals (chronic occlusion). Medical Rx was recommended. Clopidogrel  was DC'd. Imdur  was increased.   She is here alone. She notes that  she was actually not taking Imdur  due to a prior hx of HA. After DC, she did start taking it at 30 mg once daily. After restarting isosorbide , she initially experienced a severe headache, which has since resolved. She continues to take the medication daily. She had one episode of lightheadedness while walking in a store, which she associates with previous blood pressure issues, describing it as feeling like she is about to pass out, similar to past experiences with vasovagal syncope. She reports increasing symptoms of neuropathy, particularly in her feet, and is seeking further evaluation and management for this condition. I have asked her to follow up with primary care for this.    ROS-See HPI    Studies Reviewed:       Risk Assessment/Calculations:  CHA2DS2-VASc Score = 4   This indicates a 4.8% annual risk of stroke. The patient's score is based upon: CHF History: 0 HTN History: 1 Diabetes History: 1 Stroke History: 0 Vascular Disease History: 1 Age Score: 0 Gender Score: 1           Physical Exam:  VS:  BP 120/60   Pulse 76   Ht 5\' 5"  (1.651 m)   LMP 07/16/2000 (Approximate)   SpO2 97%   BMI 34.11 kg/m    Wt Readings from Last 3 Encounters:  11/25/23 205 lb (93 kg)  11/20/23 211 lb 6.4 oz (95.9 kg)  03/26/23 207 lb (93.9 kg)    Constitutional:      Appearance: Healthy appearance. Not in  distress.  Neck:     Vascular: JVD normal.  Pulmonary:     Breath sounds: Normal breath sounds. No wheezing. No rales.  Cardiovascular:     Normal rate. Regular rhythm.     Murmurs: There is a grade 2/6 systolic murmur at the URSB and ULSB.     Comments: R wrist w/o hematoma Edema:    Peripheral edema absent.  Abdominal:     Palpations: Abdomen is soft.      Assessment and Plan: Assessment & Plan Coronary artery disease involving native coronary artery of native heart without angina pectoris History of DES to the RCA November 2020 and subsequent DES to the RCA in 2022  secondary to in-stent restenosis.  She underwent DES to mid RCA and shockwave lithotripsy to the mid RCA due to in-stent restenosis in August 2023. She was recently admitted with unstable angina. Troponins were negative. EF was normal on TTE.  Cardiac catheterization demonstrated occluded mid RCA stents with left-right collaterals. She had mild to moderate nonobstructive disease elsewhere. Medical Rx was recommended. She has had several interventions to the RCA due to in-stent restenosis. We discussed that managing this medically is the best long term option.  - Continue atorvastatin  40 mg daily - Continue Imdur  30 mg once daily, Metoprolol  succinate 25 mg once daily, NTG prn - Continue Xarelto  20 mg once daily  - Continue Ramipril  2.5 mg once daily  - Follow up 6 mos Paroxysmal atrial fibrillation (HCC) Maintaining sinus rhythm on exam today. Episodes of lightheadedness while upright, consistent with vasovagal syncope. Consider event monitor if episodes increase to rule out arrhythmia. - Continue rivaroxaban  20 mg daily Primary hypertension Blood pressure is well-controlled on current medication regimen. - Continue Imdur  30 mg daily, metoprolol  succinate 25 mg daily, Ramipril  2.5 mg daily  Pure hypercholesterolemia Lipoprotein A is elevated at 208. LDL is optimal at 49 as of May 2025. Emphasized aggressive cholesterol management due to increased risk from elevated lipoprotein A. LDL is at goal at less than 55.   - Continue atorvastatin  40 mg daily - Continue ezetimibe  10 mg daily - I will reach out to Dr. Maximo Spar to see if she qualifies for any drug trials Vasovagal syncope She has a hx of vasovagal syncope. Recent episodes of lightheadedness while upright sound consistent with vasovagal symptoms. Consider event monitor if episodes change/increase. - Advise wearing compression hose and staying hydrated Diabetes mellitus without complication (HCC) Reports increasing neuropathy symptoms. Advised to  follow up with primary care for further evaluation and management.      Dispo:  Return in about 6 months (around 06/12/2024) for Routine Follow Up, w/ Dr. Lorie Rook. Signed, Marlyse Single, PA-C

## 2023-12-11 ENCOUNTER — Encounter: Payer: Self-pay | Admitting: Physician Assistant

## 2023-12-11 ENCOUNTER — Ambulatory Visit: Attending: Physician Assistant | Admitting: Physician Assistant

## 2023-12-11 VITALS — BP 120/60 | HR 76 | Ht 65.0 in | Wt 204.6 lb

## 2023-12-11 DIAGNOSIS — I10 Essential (primary) hypertension: Secondary | ICD-10-CM

## 2023-12-11 DIAGNOSIS — I2510 Atherosclerotic heart disease of native coronary artery without angina pectoris: Secondary | ICD-10-CM | POA: Diagnosis not present

## 2023-12-11 DIAGNOSIS — I480 Paroxysmal atrial fibrillation: Secondary | ICD-10-CM

## 2023-12-11 DIAGNOSIS — E7800 Pure hypercholesterolemia, unspecified: Secondary | ICD-10-CM | POA: Diagnosis not present

## 2023-12-11 DIAGNOSIS — E119 Type 2 diabetes mellitus without complications: Secondary | ICD-10-CM

## 2023-12-11 DIAGNOSIS — R55 Syncope and collapse: Secondary | ICD-10-CM

## 2023-12-11 NOTE — Assessment & Plan Note (Signed)
 She has a hx of vasovagal syncope. Recent episodes of lightheadedness while upright sound consistent with vasovagal symptoms. Consider event monitor if episodes change/increase. - Advise wearing compression hose and staying hydrated

## 2023-12-11 NOTE — Patient Instructions (Signed)
 Medication Instructions:  Your physician recommends that you continue on your current medications as directed. Please refer to the Current Medication list given to you today.  *If you need a refill on your cardiac medications before your next appointment, please call your pharmacy*  Lab Work: None ordered  If you have labs (blood work) drawn today and your tests are completely normal, you will receive your results only by: MyChart Message (if you have MyChart) OR A paper copy in the mail If you have any lab test that is abnormal or we need to change your treatment, we will call you to review the results.  Testing/Procedures: None ordered  Follow-Up: At [facility], you and your health needs are our priority.  As part of our continuing mission to provide you with exceptional heart care, our providers are all part of one team.  This team includes your primary Cardiologist (physician) and Advanced Practice Providers or APPs (Physician Assistants and Nurse Practitioners) who all work together to provide you with the care you need, when you need it.  Your next appointment:   6 month(s)  Provider:   Arun K Thukkani, MD    We recommend signing up for the patient portal called "MyChart".  Sign up information is provided on this After Visit Summary.  MyChart is used to connect with patients for Virtual Visits (Telemedicine).  Patients are able to view lab/test results, encounter notes, upcoming appointments, etc.  Non-urgent messages can be sent to your provider as well.   To learn more about what you can do with MyChart, go to [email or web address].   Other Instructions

## 2023-12-11 NOTE — Assessment & Plan Note (Signed)
 Reports increasing neuropathy symptoms. Advised to follow up with primary care for further evaluation and management.

## 2023-12-11 NOTE — Assessment & Plan Note (Signed)
 Maintaining sinus rhythm on exam today. Episodes of lightheadedness while upright, consistent with vasovagal syncope. Consider event monitor if episodes increase to rule out arrhythmia. - Continue rivaroxaban  20 mg daily

## 2023-12-11 NOTE — Assessment & Plan Note (Signed)
 Lipoprotein A is elevated at 208. LDL is optimal at 49 as of May 2025. Emphasized aggressive cholesterol management due to increased risk from elevated lipoprotein A. LDL is at goal at less than 55.   - Continue atorvastatin  40 mg daily - Continue ezetimibe  10 mg daily - I will reach out to Dr. Maximo Spar to see if she qualifies for any drug trials

## 2023-12-11 NOTE — Assessment & Plan Note (Signed)
 Blood pressure is well-controlled on current medication regimen. - Continue Imdur  30 mg daily, metoprolol  succinate 25 mg daily, Ramipril  2.5 mg daily

## 2023-12-19 ENCOUNTER — Other Ambulatory Visit (HOSPITAL_COMMUNITY)

## 2024-01-31 ENCOUNTER — Telehealth: Payer: Self-pay | Admitting: Internal Medicine

## 2024-01-31 NOTE — Telephone Encounter (Signed)
   Covering for [employer] - I agree with your recommendations. Given the provided history in the initial note, I suspect that stress and anxiety could be contributing to her elevated blood pressure since this has improved with the use of a low-dose of Xanax  at times. Agree with keeping a blood pressure log for 1 to 2 weeks and then reporting back with these readings to see if we need to adjust Ramipril .  As discussed at the time of her hospitalization, would not restart Plavix  at this time since she did not require stent placement and is already on Xarelto . Carotid Dopplers in 06/2023 did show 40 to 59% stenosis along the left internal carotid artery and the best way to prevent this from progressing is to keep cholesterol under good control and her LDL was at 46 when checked in 09/2023 which is at goal.  Signed, Laymon CHRISTELLA Qua, PA-C 01/31/2024, 2:49 PM Pager: [phone]

## 2024-01-31 NOTE — Telephone Encounter (Signed)
 Per patient schedule message:   Wed i was at my parents in [place] - very stressful, dementia.  Wed night later had a sudden sharp pain in left side of neck. I have blocked carotids (50-69%) so it did freak me out.  Then felt anxiety and tension in upper back & arms.  BP was 198/ 96.  Bottom number was 100 one time. Took extra 2.5 ramipril , baby aspirin , nitroglycerin  and 1/4 of a xanax  12.5 thinking i was panicking maybe because i was worried i was having a stroke and was alone w my mom who can't beer left alone. An hour later bp was under control.   Same thing happened at home late last night. I had eye surgery this morning for macular edema.  Took extra temporal, baby aspirin  and nibbled a xanax  ( i never take xanax ).  Bp was ok 45 mins later.  This morning got up early for eye surgery.  182/94.   Ramipril  and half a xanax .  Told eye surgeon. Eye pressure was ok so he did surgery.   BP now 124/71. Concerns: Recently had heart cath and have 100% blocked RCA w two small collaterals.  Dr took me off plavix  that over been on since 1st of four heart caths in 2020 because. "Since its 100%blocked you don't need it)   I have wondered if I should have stayed on that since we know I have blockages in my carotid as well as I was told one time that I have peripheral artery disease . (?) in my legs, but we have never addressed that because I've switched cardiologist and he was focused on the heart blockages specifically every time we saw each other.   I do take Xarelto  for a fib.

## 2024-06-02 NOTE — Progress Notes (Signed)
 Office Visit Note  Patient: Courtney Olson             Date of Birth: 08/21/1965           MRN: [id number]             PCP: Frederik Charleston, MD Referring: Nanci Senior, MD Visit Date: 06/16/2024 Occupation: Data Unavailable  Subjective:  Pain in joints and Psoriasis  History of Present Illness: Courtney Olson is a 58 y.o. female with psoriatic arthritis, psoriasis and osteoarthritis.  She returns today after her initial visit on June 04, 2024.  She states that her psoriasis cleared up while she was on prednisone  and now the psoriasis is coming back.  She also has lost a lot of hair due to psoriasis in her scalp.  Awaiting approval on Skyrizi.  She continues to have joint pain and stiffness.  She has had recurrent problems with trochanteric bursitis and plantar fasciitis.  He continues to have discomfort in her right hand and some swelling in her right hand.    Activities of Daily Living:  Patient reports morning stiffness for 0 minutes.   Patient Reports nocturnal pain.  Difficulty dressing/grooming: Denies Difficulty climbing stairs: Reports Difficulty getting out of chair: Denies Difficulty using hands for taps, buttons, cutlery, and/or writing: Denies  Review of Systems  Constitutional:  Positive for fatigue.  HENT:  Negative for mouth sores and mouth dryness.   Eyes:  Positive for dryness.  Respiratory:  Negative for shortness of breath.   Cardiovascular:  Negative for chest pain and palpitations.  Gastrointestinal:  Positive for constipation and diarrhea. Negative for blood in stool.  Endocrine: Negative for increased urination.  Genitourinary:  Negative for involuntary urination.  Musculoskeletal:  Positive for joint pain, gait problem, joint pain, joint swelling and muscle weakness. Negative for myalgias, morning stiffness, muscle tenderness and myalgias.  Skin:  Positive for rash, hair loss and sensitivity to sunlight. Negative for color change.   Allergic/Immunologic: Positive for susceptible to infections.  Neurological:  Positive for numbness. Negative for dizziness and headaches.  Hematological:  Negative for swollen glands.  Psychiatric/Behavioral:  Positive for sleep disturbance. Negative for depressed mood. The patient is nervous/anxious.     PMFS History:  Patient Active Problem List   Diagnosis Date Noted   Accelerating angina (HCC) 11/25/2023   CAD (coronary artery disease) 03/02/2022   Anxiety about health 10/26/2020   Chronic ulcerative pancolitis (HCC) 10/26/2020   Claudication 10/26/2020   Diabetic renal disease (HCC) 10/26/2020   Diarrhea 10/26/2020   Diverticulitis 10/26/2020   Ulcerative colitis (HCC) 10/26/2020   Heart failure (HCC) 10/26/2020   History of pancreatitis 10/26/2020   Obesity 10/26/2020   Polyneuropathy due to type 2 diabetes mellitus (HCC) 10/26/2020   Surgical menopause 10/26/2020   Combined forms of age-related cataract of right eye 08/04/2020   Combined forms of age-related cataract of left eye 05/05/2020   Trigger thumb of right hand 04/19/2020   Exertional dyspnea    Aortic calcification 06/14/2014   Renal mass 06/14/2014   Paroxysmal atrial fibrillation (HCC)    Precordial chest pain 04/01/2014   Vasovagal syncope 04/01/2014   Hyperlipidemia    Hypertension    OSA (obstructive sleep apnea)    RA (rheumatoid arthritis) (HCC)    GERD (gastroesophageal reflux disease)    Cytomegaloviral hepatitis (HCC)    S/p nephrectomy 04/13/2013   Atrophic vaginitis 01/13/2006   Diabetes mellitus without complication (HCC) 01/13/2006    Past Medical History:  Diagnosis Date   Aortic calcification    Atrial tachycardia    Atrophic vaginitis 01/2006   Bradycardia 05/2014   CAD (coronary artery disease)    cardiac cath 05/21/2019 showing 805 mRCA, 60% D1 and D2 that were very small and patent LAD and LCx.  She underwent DES to the [facility] with residual 25% stenosis in the dRCA   Chronic  pancolonic ulcerative colitis (HCC) 2015   unknown if chronic.   CKD (chronic kidney disease), stage II    Cytomegaloviral hepatitis (HCC) 2005   Diabetes mellitus without complication (HCC) 01/2006   on Ace inhibitor to protect kidneys   Gallstones 01/2013   GERD (gastroesophageal reflux disease)    Heart failure (HCC) 10/26/2020   Hyperlipidemia    Hypertension    Kidney tumor 03/2013   Right   NSVT (nonsustained ventricular tachycardia) (HCC)    Obesity    OSA (obstructive sleep apnea)    PAF (paroxysmal atrial fibrillation) (HCC)    Pancreatitis 2014   Postmenopausal    Premature atrial contractions    PVC's (premature ventricular contractions)    RA (rheumatoid arthritis) (HCC)    Syncope and collapse    Ulcerative colitis (HCC) 2015    Family History  Problem Relation Age of Onset   Diabetes Mother    Hypertension Mother    Fibromyalgia Mother    Kidney failure Mother    Heart attack Mother    Hypertension Father    Heart disease Father        CABG   Diabetes Father    Dementia Father    Alzheimer's disease Father    Hypertension Sister    Breast cancer Sister 40       negative genetic testing   Diabetes Paternal Aunt    Cancer Paternal Aunt        bone   Cancer Maternal Grandmother        ovarian   Diabetes Maternal Grandmother    Healthy Daughter    Healthy Daughter    Healthy Son    Past Surgical History:  Procedure Laterality Date   CATARACT EXTRACTION Bilateral    CESAREAN SECTION     CHOLECYSTECTOMY  01/13/2013   COLONOSCOPY  07/16/2013   Colitis   CORONARY LITHOTRIPSY N/A 03/02/2022   Procedure: CORONARY LITHOTRIPSY;  Surgeon: Dann Candyce RAMAN, MD;  Location: [facility] IN[facility];  Service: Cardiovascular;  Laterality: N/A;   CORONARY STENT INTERVENTION N/A 05/21/2019   Procedure: CORONARY STENT INTERVENTION;  Surgeon: Dann Candyce RAMAN, MD;  Location: [facility] INVAS[facility] CV [facility];  Service: Cardiovascular;  Laterality: N/A;   CORONARY STENT  INTERVENTION Right 06/27/2021   Procedure: CORONARY STENT INTERVENTION;  Surgeon: Dann Candyce RAMAN, MD;  Location: [facility] INVASIVE CV LAB;  Service: Cardiovascular;  Laterality: Right;   CORONARY STENT INTERVENTION N/A 03/02/2022   Procedure: CORONARY STENT INTERVENTION;  Surgeon: Dann Candyce RAMAN, MD;  Location: [facility] INVASIVE CV LAB;  Service: Cardiovascular;  Laterality: N/A;   CORONARY ULTRASOUND/IVUS N/A 03/02/2022   Procedure: Intravascular Ultrasound/IVUS;  Surgeon: Dann Candyce RAMAN, MD;  Location: [facility] INVASIVE CV LAB;  Service: Cardiovascular;  Laterality: N/A;   DILATION AND CURETTAGE OF UTERUS  07/16/2000   EYE SURGERY Bilateral    12/2023, 01/2024   HERNIA REPAIR  01/13/2013   Kidney and Bladder Stent and tube placed Right 03/16/2013   LEFT HEART CATH AND CORONARY ANGIOGRAPHY N/A 06/27/2021   Procedure: LEFT HEART CATH AND CORONARY ANGIOGRAPHY;  Surgeon: Dann Candyce RAMAN, MD;  Location: [facility]VASIVE CV [facility];  Service: Cardiovascular;  Laterality: N/A;   LEFT HEART CATH AND CORONARY ANGIOGRAPHY N/A 03/02/2022   Procedure: LEFT HEART CATH AND CORONARY ANGIOGRAPHY;  Surgeon: Dann Candyce RAMAN, MD;  Location: [facility] INVASIVE CV LAB;  Service: Cardiovascular;  Laterality: N/A;   LEFT HEART CATH AND CORONARY ANGIOGRAPHY N/A 11/27/2023   Procedure: LEFT HEART CATH AND CORONARY ANGIOGRAPHY;  Surgeon: Darron Deatrice LABOR, MD;  Location: [facility] INVASIVE CV [facility];  Service: Cardiovascular;  Laterality: N/A;   PARTIAL NEPHRECTOMY Right 02/13/2013   Tumor Removal    Procedure to remove kidney and bladder stent and tube Right 04/15/2013   RIGHT/LEFT HEART CATH AND CORONARY ANGIOGRAPHY N/A 05/21/2019   Procedure: RIGHT/LEFT HEART CATH AND CORONARY ANGIOGRAPHY;  Surgeon: Dann Candyce RAMAN, MD;  Location: [facility] INVASIVE CV LAB;  Service: Cardiovascular;  Laterality: N/A;   TOTAL ABDOMINAL HYSTERECTOMY W/ BILATERAL SALPINGOOPHORECTOMY  07/16/2000   Secondary to AUB   Social History   Tobacco Use    Smoking status: Never    Passive exposure: Never   Smokeless tobacco: Never  Vaping Use   Vaping status: Never Used  Substance Use Topics   Alcohol use: Yes    Comment: occ   Drug use: No   Social History   Social History Narrative   Not on file     Immunization History  Administered Date(s) Administered   Fluzone Influenza virus vaccine,trivalent (IIV3), split virus 05/24/2010, 08/07/2013   H1N1 06/01/2008   Influenza Split 06/01/2008, 04/19/2009   Influenza,inj,Quad PF,6+ Mos 05/31/2016, 05/04/2019, 06/22/2020   Moderna Sars-Covid-2 Vaccination 09/15/2019, 09/26/2019, 10/13/2019, 07/10/2020   Pneumococcal Polysaccharide-23 03/26/2011     Objective: Vital Signs: BP (!) 153/87   Pulse 67   Temp 98 F (36.7 C)   Resp 15   Ht 5' 5 (1.651 m)   Wt 212 lb 12.8 oz (96.5 kg)   LMP 07/16/2000 (Approximate)   BMI 35.41 kg/m    Physical Exam   Musculoskeletal Exam: Cervical, thoracic and lumbar spine Juengel range of motion.  She had no SI joint tenderness.  Shoulder joints, elbow joints, wrist joints, MCPs PIPs and DIPs were in good range of motion.  She had tenderness over the right 2nd and 3rd MCP joints.  Bilateral PIP and DIP thickening was noted.  She had tenderness over left trochanteric bursa.  Hip joints in good range of motion.  There was no warmth or swelling in her knee joints.  There was no tenderness over her ankles or MTPs.  She had mild tenderness over plantar fascia.  CDAI Exam: CDAI Score: -- Patient Global: --; Provider Global: -- Swollen: --; Tender: -- Joint Exam 06/16/2024   No joint exam has been documented for this visit   There is currently no information documented on the homunculus. Go to the Rheumatology activity and complete the homunculus joint exam.  Investigation: No additional findings.  Imaging: No results found.  Recent Labs: Lab Results  Component Value Date   WBC 7.6 11/27/2023   HGB 11.4 (L) 11/27/2023   PLT 186 11/27/2023    NA 139 11/27/2023   K 3.9 11/27/2023   CL 106 11/27/2023   CO2 25 11/27/2023   GLUCOSE 148 (H) 11/27/2023   BUN 9 11/27/2023   CREATININE 0.98 11/27/2023   BILITOT 0.7 11/25/2023   ALKPHOS 75 11/25/2023   AST 34 11/25/2023   ALT 39 11/25/2023   PROT 8.0 11/25/2023   ALBUMIN 4.2 11/25/2023   CALCIUM  8.7 (L) 11/27/2023  GFRAA 76 08/09/2020    Speciality Comments: Unable to take TNFi (reucrrent melanoma) UC - not a candidate for  MTX - Enbrel in past  Procedures:  No procedures performed Allergies: Orange (diagnostic), Empagliflozin, Metformin hcl, Nitrofurantoin, and Bactrim [sulfamethoxazole-trimethoprim]   Assessment / Plan:     Visit Diagnoses: Psoriatic arthropathy (HCC)-she continues to have pain and discomfort in multiple joints.  She was treated for seronegative rheumatoid arthritis for many years since 2012.  She was on methotrexate and Enbrel in the past.  Medications were discontinued after she was found to have renal mass.  She continues to have pain and intermittent swelling in her joints especially her right hand.  She had tenderness over her right 2nd and 3rd MCP joints.  She has been having discomfort in her lateral epicondyle region, trochanteric region and also intermittent plantar fasciitis.  Plaque psoriasis-she has several lesions of plaque psoriasis in her scalp and over her extremities.  She states psoriasis improved while she was on prednisone  and then flared.  We applied for Skyrizi and awaiting approval.  High risk medication use -when she starts Skyrizi we will check labs in 1 month and then every 3 months.  Information reimmunization was placed in the AVS.  She was advised to hold the [facility] when she develops infection and resume after the infection resolves.  Plan: IgG, IgA, IgM, Serum protein electrophoresis with reflex, QuantiFERON-TB Gold Plus, Hepatitis C antibody, Hepatitis B core antibody, IgM, Hepatitis B surface antigen, Sedimentation rate,  Comprehensive metabolic panel with GFR, CBC with Differential/Platelet  Joint stiffness-she continues to have pain and stiffness in multiple joints.  Trochanteric bursitis, left hip-she tenderness over left trochanteric bursa.  Plantar fasciitis-she intermittent plantar fasciitis.  Ulcerative chronic pancolitis with other complication (HCC)-she is symptomatic despite being on mesalamine .   Other medical problems are listed as follows:  Diverticulitis  Gastroesophageal reflux disease without esophagitis  History of pancreatitis  Primary hypertension-blood pressure was normal today.  Mixed hyperlipidemia  Paroxysmal atrial fibrillation (HCC)  Coronary artery disease involving native coronary artery of native heart without angina pectoris  Diabetic nephropathy associated with type 2 diabetes mellitus (HCC)  Polyneuropathy due to type 2 diabetes mellitus (HCC)  Fatty liver  S/p nephrectomy-Partial right  Vasovagal syncope  Anxiety about health  OSA (obstructive sleep apnea)  BMI 34.0-34.9,adult  Orders: Orders Placed This Encounter  Procedures   Hemoglobin A1c   Meds ordered this encounter  Medications   clobetasol  (TEMOVATE ) 0.05 % external solution    Sig: Apply 1 Application topically 2 (two) times daily.    Dispense:  50 mL    Refill:  0     Follow-Up Instructions: Return in about 3 months (around 09/14/2024) for Psoriatic arthritis/psoriasis.   Maya Nash, MD  Note - This record has been created using Animal nutritionist.  Chart creation errors have been sought, but may not always  have been located. Such creation errors do not reflect on  the standard of medical care.

## 2024-06-03 NOTE — Progress Notes (Unsigned)
 [facility] Office Note:   Date:  06/04/2024  ID:  Courtney Olson, DOB 05-22-1966, MRN [id number] PCP:  Nanci Senior, MD  [facility] Providers Cardiologist:  Wendel Haws, MD Referring MD: Nanci Senior, MD  Chief Complaint/Reason for Referral: Follow-up coronary artery disease ASSESSMENT:    1. Coronary artery disease involving native coronary artery of native heart without angina pectoris   2. Paroxysmal atrial fibrillation (HCC)   3. Secondary hypercoagulable state   4. Bilateral carotid artery stenosis   5. Type 2 diabetes mellitus without complication, with long-term current use of insulin  (HCC)   6. Hypertension associated with diabetes (HCC)   7. Hyperlipidemia associated with type 2 diabetes mellitus (HCC)   8. Aortic atherosclerosis   9. BMI 34.0-34.9,adult     PLAN:   In order of problems listed above: Coronary artery disease: Has moderate left-sided disease and occluded RCA stent.  Medical therapy will be pursued.  Continue Xarelto  20 mg, Toprol  25 mg, Imdur  30 mg at bedtime, atorvastatin  40 mg, as needed nitroglycerin . Paroxysmal atrial fibrillation: Continue Toprol  25 mg and Xarelto  20 mg.  Has mild relatively infrequent palpitations that do not seem bothersome to her.  Continue medical therapy.  She is in normal sinus rhythm today. Secondary hypercoagulable state: Continue Xarelto  20 mg Carotid artery disease: Continue Xarelto  20 mg, atorvastatin  40 mg, and strict blood pressure control. T2DM: Continue Xarelto  20 mg instead of aspirin , ramipril  2.5 mg, defer SGLT2 inhibitor due to intolerance, continue atorvastatin  40 mg Hypertension: Continue ramipril  2.5 mg, Toprol  25 mg.  Blood pressure is well-controlled today. Hyperlipidemia: Continue atorvastatin  40 mg, Zetia  10 mg.  LDL was 49 in May of this year which is at goal.  Check hs-CRP and if elevated could consider low-dose colchicine.  Given URI symptoms will defer hs-CRP for now. Aortic atherosclerosis:  Continue Xarelto  20 mg, atorvastatin  40 mg Elevated BMI: Continue Trulicity  0.75 mg q. weekly            Dispo:  Return in about 6 months (around 12/02/2024).       I spent 35 minutes reviewing all clinical data during and prior to this visit including all relevant imaging studies, laboratories, clinical information from other health systems and prior notes from both Cardiology and other specialties, interviewing the patient, conducting a complete physical examination, and coordinating care in order to formulate a comprehensive and personalized evaluation and treatment plan.   History of Present Illness:    FOCUSED PROBLEM LIST:   Coronary artery disease  S/p DES to RCA in 05/2019; S/p DES to RCA in 06/2021 due to ISR; S/p 3.5 x 12 mm Synergy DES to [facility] and shockwave lithotripsy to mRCA ISR in 02/2022 TTE 11/26/23: EF 65-70, no RWMA, Gr 1 DD, mod AV Ca2+, AV sclerosis, borderline dilation of aortic arch 33 mm LHC 11/27/23: mLAD 30, D1 60, D2 60; oLCx 25; pRCA 95, mRCA stents 100 w L-R collats >> Med Rx Paroxysmal atrial fibrillation  Monitor 07/2021: no Afib On Xarelto  Carotid artery disease US  07/05/2023: R 1-39; L 40-59 Diabetes mellitus On insulin  Intolerant of SGLT2 inhibitors Hypertension  Hyperlipidemia  Lp(a) 208 Aortic atherosclerosis  OSA Bradycardia Hx of vasovagal syncope  Hx of pancreatitis  S/p Renal mass excision (benign) Ulcerative colitis  Hx of CMV hepatitis  Rheumatoid arthritis BMI 34  November 2025:  Patient consents to use of AI scribe. The patient returns for routine follow-up.  She was last seen in May of this year.  She is  on medical therapy for an occluded mid RCA stent.  She had an episode of lightheadedness on Imdur .  Her blood pressure was well-controlled.  No changes were made to her medical regimen.  She experiences irregular heartbeats, particularly at night, described as 'random heartbeats' and 'a little build up and then a pound.' She has a  history of atrial fibrillation diagnosed years ago after wearing a monitor and is currently on Xarelto . These irregular heartbeats are more noticeable at night when she is still, and less so during the day when she is active.   She has a persistent cough that occurs primarily at night, leading to disrupted sleep. She recently tested negative for COVID-19 and other respiratory infections. She was prescribed steroids, promethazine with codeine, and an inhaler, which have provided some relief since starting them the previous day. She is concerned about visiting her elderly parents while experiencing these symptoms.  She denies chest pain or significant shortness of breath.  She has had no issues with her medications.  She has had no bleeding.       Current Medications: Current Meds  Medication Sig   ALPRAZolam  (XANAX ) 0.25 MG tablet Take 1 tablet (0.25 mg total) by mouth 2 (two) times daily as needed.   atorvastatin  (LIPITOR) 40 MG tablet Take 1 tablet (40 mg total) by mouth daily.   azithromycin  (ZITHROMAX ) 250 MG tablet Take 250 mg by mouth daily.   Cholecalciferol (VITAMIN D3 PO) Take 1 capsule by mouth daily.   Cyanocobalamin (VITAMIN B12 PO) Take 1 tablet by mouth daily.   Dulaglutide  (TRULICITY ) 0.75 MG/0.5ML SOPN Inject 0.75 mg into the skin every Monday.   EPINEPHrine  0.3 mg/0.3 mL IJ SOAJ injection Inject 0.3 mLs into the skin as needed for anaphylaxis.   ezetimibe  (ZETIA ) 10 MG tablet Take 1 tablet (10 mg total) by mouth daily.   fluticasone (FLONASE) 50 MCG/ACT nasal spray Place 1 spray into both nostrils daily as needed for allergies.   furosemide  (LASIX ) 20 MG tablet Take 1 tablet (20 mg total) by mouth as needed for fluid or edema.   HUMALOG KWIKPEN 100 UNIT/ML KwikPen Inject 20 Units into the skin in the morning and at bedtime.   hydrocortisone 2.5 % cream Apply 1 Application topically as needed (for itching).   isosorbide  mononitrate (IMDUR ) 30 MG 24 hr tablet Take 1 tablet (30 mg  total) by mouth at bedtime.   ketoconazole (NIZORAL) 2 % cream Apply 1 Application topically 2 (two) times daily as needed.   magnesium  oxide (MAG-OX) 400 MG tablet Take 1 tablet (400 mg total) by mouth in the morning, at noon, and at bedtime.   mesalamine  (LIALDA ) 1.2 g EC tablet Take 1.2 g by mouth daily.    methylPREDNISolone  (MEDROL  DOSEPAK) 4 MG TBPK tablet Take by mouth.   metoprolol  succinate (TOPROL -XL) 25 MG 24 hr tablet Take 1 tablet (25 mg total) by mouth daily.   Multiple Vitamin (MULTIVITAMIN) tablet Take 1 tablet by mouth daily. Pack Heart healthy   nitroGLYCERIN  (NITROSTAT ) 0.4 MG SL tablet Place 1 tablet (0.4 mg total) under the tongue every 5 (five) minutes as needed.   promethazine (PHENERGAN) 6.25 MG/5ML solution Take by mouth every 6 (six) hours as needed for nausea or vomiting.   ramipril  (ALTACE ) 2.5 MG capsule Take 1 capsule (2.5 mg total) by mouth daily.   rivaroxaban  (XARELTO ) 20 MG TABS tablet Take 1 tablet (20 mg total) by mouth daily.   TOUJEO  SOLOSTAR 300 UNIT/ML Solostar Pen Inject 20 Units  into the skin in the morning and at bedtime.     Review of Systems:   Please see the history of present illness.    All other systems reviewed and are negative.     EKGs/Labs/Other Test Reviewed:   EKG: 2025 normal sinus rhythm incomplete right bundle branch block  EKG Interpretation Date/Time:    Ventricular Rate:    PR Interval:    QRS Duration:    QT Interval:    QTC Calculation:   R Axis:      Text Interpretation:          CARDIAC STUDIES: Refer to CV Procedures and Imaging Tabs   Risk Assessment/Calculations:    CHA2DS2-VASc Score = 4   This indicates a 4.8% annual risk of stroke. The patient's score is based upon: CHF History: 0 HTN History: 1 Diabetes History: 1 Stroke History: 0 Vascular Disease History: 1 Age Score: 0 Gender Score: 1         Physical Exam:   VS:  BP 130/70 (BP Location: Left Arm, Patient Position: Sitting, Cuff Size:  Large)   Pulse 83   Ht 5' 5 (1.651 m)   Wt 210 lb 10.4 oz (95.6 kg)   LMP 07/16/2000 (Approximate)   SpO2 95%   BMI 35.05 kg/m        Wt Readings from Last 3 Encounters:  06/04/24 210 lb 10.4 oz (95.6 kg)  12/11/23 204 lb 9.6 oz (92.8 kg)  11/25/23 205 lb (93 kg)      GENERAL:  No apparent distress, AOx3 HEENT:  No carotid bruits, +2 carotid impulses, no scleral icterus CAR: RRR no murmurs, gallops, rubs, or thrills RES:  Clear to auscultation bilaterally ABD:  Soft, nontender, nondistended, positive bowel sounds x 4 VASC:  +2 left radial pulse, occluded right radial, +2 carotid pulses NEURO:  CN 2-12 grossly intact; motor and sensory grossly intact PSYCH:  No active depression or anxiety EXT:  No edema, ecchymosis, or cyanosis  Signed, Santia Labate K Faustino Luecke, MD  06/04/2024 9:23 AM    [facility] [address] [place], [place], [place]  [address] Phone: [phone]; Fax: [phone]   Note:  This document was prepared using Dragon voice recognition software and may include unintentional dictation errors.

## 2024-06-04 ENCOUNTER — Ambulatory Visit (INDEPENDENT_AMBULATORY_CARE_PROVIDER_SITE_OTHER): Admitting: Internal Medicine

## 2024-06-04 ENCOUNTER — Telehealth: Payer: Self-pay

## 2024-06-04 ENCOUNTER — Encounter: Payer: Self-pay | Admitting: Rheumatology

## 2024-06-04 ENCOUNTER — Ambulatory Visit: Attending: Rheumatology | Admitting: Rheumatology

## 2024-06-04 ENCOUNTER — Encounter: Payer: Self-pay | Admitting: Internal Medicine

## 2024-06-04 VITALS — BP 130/70 | HR 83 | Ht 65.0 in | Wt 210.7 lb

## 2024-06-04 VITALS — BP 144/86 | HR 76 | Temp 97.5°F | Resp 17 | Ht 65.0 in | Wt 211.4 lb

## 2024-06-04 DIAGNOSIS — M7062 Trochanteric bursitis, left hip: Secondary | ICD-10-CM

## 2024-06-04 DIAGNOSIS — I152 Hypertension secondary to endocrine disorders: Secondary | ICD-10-CM

## 2024-06-04 DIAGNOSIS — Z13820 Encounter for screening for osteoporosis: Secondary | ICD-10-CM

## 2024-06-04 DIAGNOSIS — E1169 Type 2 diabetes mellitus with other specified complication: Secondary | ICD-10-CM

## 2024-06-04 DIAGNOSIS — Z6834 Body mass index (BMI) 34.0-34.9, adult: Secondary | ICD-10-CM

## 2024-06-04 DIAGNOSIS — G4733 Obstructive sleep apnea (adult) (pediatric): Secondary | ICD-10-CM

## 2024-06-04 DIAGNOSIS — M2550 Pain in unspecified joint: Secondary | ICD-10-CM

## 2024-06-04 DIAGNOSIS — I480 Paroxysmal atrial fibrillation: Secondary | ICD-10-CM | POA: Diagnosis not present

## 2024-06-04 DIAGNOSIS — D6869 Other thrombophilia: Secondary | ICD-10-CM | POA: Diagnosis not present

## 2024-06-04 DIAGNOSIS — Z905 Acquired absence of kidney: Secondary | ICD-10-CM

## 2024-06-04 DIAGNOSIS — J069 Acute upper respiratory infection, unspecified: Secondary | ICD-10-CM

## 2024-06-04 DIAGNOSIS — Z79899 Other long term (current) drug therapy: Secondary | ICD-10-CM | POA: Diagnosis not present

## 2024-06-04 DIAGNOSIS — Z8719 Personal history of other diseases of the digestive system: Secondary | ICD-10-CM

## 2024-06-04 DIAGNOSIS — K219 Gastro-esophageal reflux disease without esophagitis: Secondary | ICD-10-CM

## 2024-06-04 DIAGNOSIS — I2510 Atherosclerotic heart disease of native coronary artery without angina pectoris: Secondary | ICD-10-CM

## 2024-06-04 DIAGNOSIS — I10 Essential (primary) hypertension: Secondary | ICD-10-CM

## 2024-06-04 DIAGNOSIS — E1159 Type 2 diabetes mellitus with other circulatory complications: Secondary | ICD-10-CM

## 2024-06-04 DIAGNOSIS — E1121 Type 2 diabetes mellitus with diabetic nephropathy: Secondary | ICD-10-CM

## 2024-06-04 DIAGNOSIS — R4589 Other symptoms and signs involving emotional state: Secondary | ICD-10-CM

## 2024-06-04 DIAGNOSIS — I6523 Occlusion and stenosis of bilateral carotid arteries: Secondary | ICD-10-CM

## 2024-06-04 DIAGNOSIS — K760 Fatty (change of) liver, not elsewhere classified: Secondary | ICD-10-CM

## 2024-06-04 DIAGNOSIS — I700 Atherosclerosis of aorta: Secondary | ICD-10-CM

## 2024-06-04 DIAGNOSIS — E119 Type 2 diabetes mellitus without complications: Secondary | ICD-10-CM

## 2024-06-04 DIAGNOSIS — M2560 Stiffness of unspecified joint, not elsewhere classified: Secondary | ICD-10-CM | POA: Diagnosis not present

## 2024-06-04 DIAGNOSIS — Z794 Long term (current) use of insulin: Secondary | ICD-10-CM

## 2024-06-04 DIAGNOSIS — L400 Psoriasis vulgaris: Secondary | ICD-10-CM | POA: Diagnosis not present

## 2024-06-04 DIAGNOSIS — K51018 Ulcerative (chronic) pancolitis with other complication: Secondary | ICD-10-CM

## 2024-06-04 DIAGNOSIS — E1142 Type 2 diabetes mellitus with diabetic polyneuropathy: Secondary | ICD-10-CM

## 2024-06-04 DIAGNOSIS — E785 Hyperlipidemia, unspecified: Secondary | ICD-10-CM

## 2024-06-04 DIAGNOSIS — M722 Plantar fascial fibromatosis: Secondary | ICD-10-CM

## 2024-06-04 DIAGNOSIS — E782 Mixed hyperlipidemia: Secondary | ICD-10-CM

## 2024-06-04 DIAGNOSIS — R55 Syncope and collapse: Secondary | ICD-10-CM

## 2024-06-04 DIAGNOSIS — K5792 Diverticulitis of intestine, part unspecified, without perforation or abscess without bleeding: Secondary | ICD-10-CM

## 2024-06-04 NOTE — Progress Notes (Signed)
 Pharmacy Note  Subjective: Patient presents today to [facility] for follow up office visit.  Patient seen by the pharmacist for counseling on Skyrizi and Tremfya for psoriasis (PsO) and ulcerative colitis.  Prior therapy includes: methotrexate and Enbrel.  Patient has history of melanomas and active UC symptoms.   Objective:  CBC    Component Value Date/Time   WBC 7.6 11/27/2023 1258   RBC 3.77 (L) 11/27/2023 1258   HGB 11.4 (L) 11/27/2023 1258   HGB 13.8 10/03/2023 1413   HCT 33.9 (L) 11/27/2023 1258   HCT 41.7 10/03/2023 1413   PLT 186 11/27/2023 1258   PLT 159 10/03/2023 1413   MCV 89.9 11/27/2023 1258   MCV 91 10/03/2023 1413   MCH 30.2 11/27/2023 1258   MCHC 33.6 11/27/2023 1258   RDW 14.7 11/27/2023 1258   RDW 14.3 10/03/2023 1413   LYMPHSABS 1.5 05/04/2018 1048   MONOABS 0.5 05/04/2018 1048   EOSABS 0.1 05/04/2018 1048   BASOSABS 0.0 05/04/2018 1048     CMP     Component Value Date/Time   NA 139 11/27/2023 1258   NA 136 10/03/2023 1413   K 3.9 11/27/2023 1258   CL 106 11/27/2023 1258   CO2 25 11/27/2023 1258   GLUCOSE 148 (H) 11/27/2023 1258   BUN 9 11/27/2023 1258   BUN 14 10/03/2023 1413   CREATININE 0.98 11/27/2023 1258   CALCIUM  8.7 (L) 11/27/2023 1258   PROT 8.0 11/25/2023 0415   PROT 6.8 10/03/2023 1413   ALBUMIN 4.2 11/25/2023 0415   ALBUMIN 4.4 10/03/2023 1413   AST 34 11/25/2023 0415   ALT 39 11/25/2023 0415   ALKPHOS 75 11/25/2023 0415   BILITOT 0.7 11/25/2023 0415   BILITOT 0.9 10/03/2023 1413   GFRNONAA >60 11/27/2023 1258   GFRAA 76 08/09/2020 1427     Baseline Immunosuppressant Therapy Labs TB GOLD   Hepatitis Panel   HIV Lab Results  Component Value Date   HIV Non Reactive 11/25/2023   Immunoglobulins   SPEP    Latest Ref Rng & Units 11/25/2023    4:15 AM  Serum Protein Electrophoresis  Total Protein 6.5 - 8.1 g/dL 8.0    H3EI No results found for: G6PDH TPMT No results found for: TPMT   Chest  x-ray: 11/25/2023: IMPRESSION: 1. Tiny bilateral pleural effusions. 2. Chronic interstitial coarsening.  Assessment/Plan:  Patient is not candidate for TNFi due to melanomas. Both medication options are approved for UC.  Counseled patient that Skyrizi and Tremfya is a IL-23 inhibitor. Counseled patient on purpose, proper use, and adverse effects of Skyrizi and Tremfya.  Reviewed the most common adverse effects including infection, URTIs, headache, fatigue, tinea infections, and injection site. Counseled patient that Skyrizi and Tremfya should be held for infection and prior to scheduled surgery.  Recommend annual influenza, PCV 15 or PCV20 or Pneumovax 23, and Shingrix as indicated. Advised that live vaccines should be avoided while taking Skyrizi and Tremfya.  Reviewed the importance of regular labs while on Skyrizi and Tremfya therapy.  Will monitor CBC and CMP 1 month after starting and then every 3 months routinely thereafter. Will monitor TB gold annually. Standing orders placed.  Provided patient with medication education material and answered all questions.  Patient voiced understanding. Patient consented to Skyrizi and Tremfya pending insurance.  Will upload consent into the media tab. Reviewed storage instructions of Skyrizi and Tremfya. Patient verbalized understanding. Will apply for Skyrizi or Tremfya through at&t and update when we  receive a response.  Dose will be Skyrizi 150 mg subcutaneously at week 0, week 4 then every 12 weeks thereafter. Prescription pending lab results and insurance approval. Pending insurance approval, Tremfya dose will be 100 mg on weeks 0 and 4 then every 8 weeks thereafter.  Prescription pending lab results and insurance approval.  Jenkins Graces, PharmD PGY1 Pharmacy Resident [phone]

## 2024-06-04 NOTE — Telephone Encounter (Signed)
 Courtney Olson RAMAN, RPH-CPP  P Rx Rheum/Pulm Please start Skyrizi SQ BIV Dose: 150mg  subcut at Week 0, Week 4, then every 12 weeks  If Skyrizi denied, can go for Tremfya (100mg  at Week 0, Week 4, every 8 weeks) Primary dx; PsO and secondary is UC Failed MTX in past Failed Enbrel She has very active Psoriasis and active UC (diarrhea) Not a candidate to retry MTX and leflunomide due to diarrhea Unable to take TNFi due to recurrent melanomas -------------------------------------------------------------  Submitted a FAXED Prior Authorization request to [facility] for SKYRIZI SQ via CoverMyMeds. Will update once we receive a response.  Key: [id number]

## 2024-06-04 NOTE — Progress Notes (Signed)
 Office Visit Note  Patient: Courtney Olson             Date of Birth: 03-01-1966           MRN: [id number]             PCP: Frederik Charleston, MD Referring: Frederik Charleston, MD Visit Date: 06/04/2024 Occupation: Data Unavailable  Subjective:  Psoriasis and joint pain  History of Present Illness: Courtney Olson is a 58 y.o. female seen for the evaluation of psoriasis and arthralgias.  Patient was initially evaluated by me in 2012 when she started having pain in her hands after a viral syndrome.  She was given a prednisone  taper by her doctors.  On the chart review it is mentioned that the pain gradually moved to her hands, wrists, left shoulder, hips, knees, ankles and her feet.  She was diagnosed with the left medial epicondylitis by her orthopedic surgeon.  She was given Medrol  Dosepak by her PCP.  The labs obtained by her PCP at the time showed that her sed rate was elevated at 63, uric acid was normal, ACE negative, rheumatoid factor negative, ANA negative, anti-CCP negative, x-rays obtained at the time of her hands and feet were within normal limits.  Seronegative rheumatoid arthritis was suspected.  Patient was followed in the office for about 3 years.  She was given the diagnosis of seronegative inflammatory arthritis.  Intermittent inflammation was noted in her MCP joints during the course.  She was treated with methotrexate and later Enbrel was added.  She developed a right renal tumor in 2015 for which both medications were discontinued.  As she had no inflammation we decided to keep her off both medications.  Her last visit with me was on October 05, 2013.  Patient states that her disease went into remission and she did not require any treatment for many years.  She states she had intentional weight loss of 107 pounds with diet and exercise.  She was diagnosed with coronary artery disease later and had 4 stents.  She states she has been seeing a dermatologist on a regular basis due to  recurrent squamous cell cancer.  She states in January 2025 she developed a lesion on her leg which came positive for plaque psoriasis.  Since then she has had extensive psoriasis spread all over her body involving the scalp, trunk and extremities.  She states she has been given clobetasol ointment by her dermatologist which has not been effective to control psoriasis. She has been experiencing some stiffness in her hands but has not experienced joint swelling.  She has also had intermittent discomfort in her left trochanteric bursa.  She had 1 episode of plantar fasciitis 3 years ago.  She denies any SI joint pain.  She has had right trigger thumb for which she had 3 injections at [facility]. She has history of ulcerative colitis since she was in her 63s.  She is on mesalamine  for it.  Patient states she is currently suffering from upper respiratory tract infection.  She was placed on prednisone  and Z-Pak.  She would like to have labs after she is off the medications.  There is no family history of rheumatoid arthritis, psoriatic arthritis or psoriasis.   She is right-handed, travel agent.  She travels.  She walks for exercise.  She is married, gravida 4, para 3, miscarriage 1.  She had preeclampsia with her third pregnancy.  There is no history of DVTs.  She drinks alcohol  socially.  She has never been a smoker.    Activities of Daily Living:  Patient reports morning stiffness for a few minutes.   Patient Denies nocturnal pain.  Difficulty dressing/grooming: Denies Difficulty climbing stairs: Reports Difficulty getting out of chair: Denies Difficulty using hands for taps, buttons, cutlery, and/or writing: Denies  Review of Systems  Constitutional:  Positive for fatigue.  HENT:  Positive for mouth dryness. Negative for mouth sores.   Eyes:  Positive for dryness.  Respiratory:  Negative for shortness of breath.   Cardiovascular:  Positive for chest pain and palpitations.   Gastrointestinal:  Positive for constipation and diarrhea. Negative for blood in stool.  Endocrine: Negative for increased urination.  Genitourinary:  Negative for involuntary urination.  Musculoskeletal:  Positive for joint pain, joint pain, muscle weakness and morning stiffness. Negative for gait problem, joint swelling, myalgias, muscle tenderness and myalgias.  Skin:  Positive for rash and hair loss. Negative for color change and sensitivity to sunlight.  Allergic/Immunologic: Negative for susceptible to infections.  Neurological:  Positive for numbness and headaches. Negative for dizziness.  Hematological:  Negative for swollen glands.  Psychiatric/Behavioral:  Positive for sleep disturbance. Negative for depressed mood. The patient is nervous/anxious.     PMFS History:  Patient Active Problem List   Diagnosis Date Noted   Accelerating angina (HCC) 11/25/2023   CAD (coronary artery disease) 03/02/2022   Anxiety about health 10/26/2020   Chronic ulcerative pancolitis (HCC) 10/26/2020   Claudication 10/26/2020   Diabetic renal disease (HCC) 10/26/2020   Diarrhea 10/26/2020   Diverticulitis 10/26/2020   Ulcerative colitis (HCC) 10/26/2020   Heart failure (HCC) 10/26/2020   History of pancreatitis 10/26/2020   Obesity 10/26/2020   Polyneuropathy due to type 2 diabetes mellitus (HCC) 10/26/2020   Surgical menopause 10/26/2020   Combined forms of age-related cataract of right eye 08/04/2020   Combined forms of age-related cataract of left eye 05/05/2020   Trigger thumb of right hand 04/19/2020   Exertional dyspnea    Aortic calcification 06/14/2014   Renal mass 06/14/2014   Paroxysmal atrial fibrillation (HCC)    Precordial chest pain 04/01/2014   Vasovagal syncope 04/01/2014   Hyperlipidemia    Hypertension    OSA (obstructive sleep apnea)    RA (rheumatoid arthritis) (HCC)    GERD (gastroesophageal reflux disease)    Cytomegaloviral hepatitis (HCC)    S/p nephrectomy  04/13/2013   Atrophic vaginitis 01/13/2006   Diabetes mellitus without complication (HCC) 01/13/2006    Past Medical History:  Diagnosis Date   Aortic calcification    Atrial tachycardia    Atrophic vaginitis 01/2006   Bradycardia 05/2014   CAD (coronary artery disease)    cardiac cath 05/21/2019 showing 805 mRCA, 60% D1 and D2 that were very small and patent LAD and LCx.  She underwent DES to the [facility] with residual 25% stenosis in the dRCA   Chronic pancolonic ulcerative colitis (HCC) 2015   unknown if chronic.   CKD (chronic kidney disease), stage II    Cytomegaloviral hepatitis (HCC) 2005   Diabetes mellitus without complication (HCC) 01/2006   on Ace inhibitor to protect kidneys   Gallstones 01/2013   GERD (gastroesophageal reflux disease)    Heart failure (HCC) 10/26/2020   Hyperlipidemia    Hypertension    Kidney tumor 03/2013   Right   NSVT (nonsustained ventricular tachycardia) (HCC)    Obesity    OSA (obstructive sleep apnea)    PAF (paroxysmal atrial fibrillation) (HCC)  Pancreatitis 2014   Postmenopausal    Premature atrial contractions    PVC's (premature ventricular contractions)    RA (rheumatoid arthritis) (HCC)    Syncope and collapse    Ulcerative colitis (HCC) 2015    Family History  Problem Relation Age of Onset   Diabetes Mother    Hypertension Mother    Fibromyalgia Mother    Kidney failure Mother    Heart attack Mother    Hypertension Father    Heart disease Father        CABG   Diabetes Father    Dementia Father    Alzheimer's disease Father    Hypertension Sister    Breast cancer Sister 33       negative genetic testing   Diabetes Paternal Aunt    Cancer Paternal Aunt        bone   Cancer Maternal Grandmother        ovarian   Diabetes Maternal Grandmother    Healthy Daughter    Healthy Daughter    Healthy Son    Past Surgical History:  Procedure Laterality Date   CATARACT EXTRACTION Bilateral    CESAREAN SECTION      CHOLECYSTECTOMY  01/13/2013   COLONOSCOPY  07/16/2013   Colitis   CORONARY LITHOTRIPSY N/A 03/02/2022   Procedure: CORONARY LITHOTRIPSY;  Surgeon: Dann Candyce RAMAN, MD;  Location: [facility] INVASIVE CV LAB;  Service: Cardiovascular;  Laterality: N/A;   CORONARY STENT INTERVENTION N/A 05/21/2019   Procedure: CORONARY STENT INTERVENTION;  Surgeon: Dann Candyce RAMAN, MD;  Location: [facility] INVASIVE CV LAB;  Service: Cardiovascular;  Laterality: N/A;   CORONARY STENT INTERVENTION Right 06/27/2021   Procedure: CORONARY STENT INTERVENTION;  Surgeon: Dann Candyce RAMAN, MD;  [facility] CV LAB;  Service: Cardiovascular;  Laterality: Right;   CORONARY STENT INTERVENTION N/A 03/02/2022   Procedure: CORONARY STENT INTERVENTION;  Surgeon: Dann Candyce RAMAN, MD;  Location: [facility] INVASIVE CV LAB;  Service: Cardiovascular;  Laterality: N/A;   CORONARY ULTRASOUND/IVUS N/A 03/02/2022   Procedure: Intravascular Ultrasound/IVUS;  Surgeon: Dann Candyce RAMAN, MD;  Location: [facility] INVASIVE CV LAB;  Service: Cardiovascular;  Laterality: N/A;   DILATION AND CURETTAGE OF UTERUS  07/16/2000   EYE SURGERY Bilateral    12/2023, 01/2024   HERNIA REPAIR  01/13/2013   Kidney and Bladder Stent and tube placed Right 03/16/2013   LEFT HEART CATH AND CORONARY ANGIOGRAPHY N/A 06/27/2021   Procedure: LEFT HEART CATH AND CORONARY ANGIOGRAPHY;  Surgeon: Dann Candyce RAMAN, MD;  Location: [facility] INVASIVE CV LAB;  Service: Cardiovascular;  Laterality: N/A;   LEFT HEART CATH AND CORONARY ANGIOGRAPHY N/A 03/02/2022   Procedure: LEFT HEART CATH AND CORONARY ANGIOGRAPHY;  Surgeon: Dann Candyce RAMAN, MD;  Location: [facility] INVASIVE CV LAB;  Service: Cardiovascular;  Laterality: N/A;   LEFT HEART CATH AND CORONARY ANGIOGRAPHY N/A 11/27/2023   Procedure: LEFT HEART CATH AND CORONARY ANGIOGRAPHY;  Surgeon: Darron Deatrice LABOR, MD;  Location: [facility] INVASIVE CV LAB;  Service: Cardiovascular;  Laterality: N/A;   PARTIAL NEPHRECTOMY Right  02/13/2013   Tumor Removal    Procedure to remove kidney and bladder stent and tube Right 04/15/2013   RIGHT/LEFT HEART CATH AND CORONARY ANGIOGRAPHY N/A 05/21/2019   Procedure: RIGHT/LEFT HEART CATH AND CORONARY ANGIOGRAPHY;  Surgeon: Dann Candyce RAMAN, MD;  Location: [facility] INVASIVE CV LAB;  Service: Cardiovascular;  Laterality: N/A;   TOTAL ABDOMINAL HYSTERECTOMY W/ BILATERAL SALPINGOOPHORECTOMY  07/16/2000   Secondary to AUB   Social History   Tobacco  Use   Smoking status: Never    Passive exposure: Never   Smokeless tobacco: Never  Vaping Use   Vaping status: Never Used  Substance Use Topics   Alcohol use: Yes    Comment: occ   Drug use: No   Social History   Social History Narrative   Not on file     Immunization History  Administered Date(s) Administered   Fluzone Influenza virus vaccine,trivalent (IIV3), split virus 05/24/2010, 08/07/2013   H1N1 06/01/2008   Influenza Split 06/01/2008, 04/19/2009   Influenza,inj,Quad PF,6+ Mos 05/31/2016, 05/04/2019, 06/22/2020   Moderna Sars-Covid-2 Vaccination 09/15/2019, 09/26/2019, 10/13/2019, 07/10/2020   Pneumococcal Polysaccharide-23 03/26/2011     Objective: Vital Signs: BP (!) 144/86 (BP Location: Right Arm, Patient Position: Sitting, Cuff Size: Normal)   Pulse 76   Temp (!) 97.5 F (36.4 C)   Resp 17   Ht 5' 5 (1.651 m)   Wt 211 lb 6.4 oz (95.9 kg)   LMP 07/16/2000 (Approximate)   BMI 35.18 kg/m    Physical Exam Vitals and nursing note reviewed.  Constitutional:      Appearance: She is well-developed.  HENT:     Head: Normocephalic and atraumatic.  Eyes:     Conjunctiva/sclera: Conjunctivae normal.  Cardiovascular:     Rate and Rhythm: Normal rate and regular rhythm.     Heart sounds: Normal heart sounds.  Pulmonary:     Effort: Pulmonary effort is normal.     Breath sounds: Normal breath sounds.  Abdominal:     General: Bowel sounds are normal.     Palpations: Abdomen is soft.  Musculoskeletal:      Cervical back: Normal range of motion.  Lymphadenopathy:     Cervical: No cervical adenopathy.  Skin:    General: Skin is warm and dry.     Capillary Refill: Capillary refill takes less than 2 seconds.  Neurological:     Mental Status: She is alert and oriented to person, place, and time.  Psychiatric:        Behavior: Behavior normal.      Musculoskeletal Exam: Cervical, thoracic and lumbar spine were in good range of motion.  There was no SI joint tenderness.  Shoulder joints, elbow joints, wrist joints, MCPs, PIPs and DIPs were in good range of motion with no synovitis.  Hip joints and knee joints were in good range of motion without any warmth swelling or effusion.  She had mild tenderness over the left trochanteric bursa.  There was no tenderness over ankles or MTPs.   CDAI Exam: CDAI Score: -- Patient Global: --; Provider Global: -- Swollen: --; Tender: -- Joint Exam 06/04/2024   No joint exam has been documented for this visit   There is currently no information documented on the homunculus. Go to the Rheumatology activity and complete the homunculus joint exam.  Investigation: No additional findings.  Imaging: No results found.  Recent Labs: Lab Results  Component Value Date   WBC 7.6 11/27/2023   HGB 11.4 (L) 11/27/2023   PLT 186 11/27/2023   NA 139 11/27/2023   K 3.9 11/27/2023   CL 106 11/27/2023   CO2 25 11/27/2023   GLUCOSE 148 (H) 11/27/2023   BUN 9 11/27/2023   CREATININE 0.98 11/27/2023   BILITOT 0.7 11/25/2023   ALKPHOS 75 11/25/2023   AST 34 11/25/2023   ALT 39 11/25/2023   PROT 8.0 11/25/2023   ALBUMIN 4.2 11/25/2023   CALCIUM  8.7 (L) 11/27/2023   GFRAA 76 08/09/2020  March 26, 2011 hepatitis B nonreactive, hepatitis C nonreactive  Speciality Comments: Unable to take TNFi (reucrrent melanoma) UC - not a candidate for  MTX - Enbrel in past  Procedures:  No procedures performed Allergies: Orange (diagnostic), Empagliflozin,  Metformin hcl, Nitrofurantoin, and Bactrim [sulfamethoxazole-trimethoprim]   Assessment / Plan:     Visit Diagnoses: Polyarthralgia-patient complains of joint stiffness but no joint swelling.  She was initially evaluated by me 2012 for seronegative inflammatory arthritis and was treated with methotrexate and Enbrel.  She came off both medications in 2015 after she developed a renal mass.  She states her disease went into remission with no recurrence.  She complains of some joint stiffness but no joint swelling.  High risk medication use - Plan: CBC with Differential/Platelet, Comprehensive metabolic panel with GFR, Sedimentation rate, Hepatitis B surface antigen, Hepatitis B core antibody, IgM, Hepatitis C antibody, QuantiFERON-TB Gold Plus, Serum protein electrophoresis with reflex, IgG, IgA, IgM  Plaque psoriasis -she developed psoriasis in January 2025 on her right lower extremity.  Gradually the psoriasis spread all over her body.  Now she has psoriasis involving her scalp, face, extremities and trunk.  She has been using topical agents without much relief.  She seen her dermatologist who recommended seeing a rheumatologist to start on Biologics.  I did detailed discussion with the patient regarding different treatment options.  She also has ulcerative colitis.  She has chronic diarrhea.  She states her diarrhea is not controlled with mesalamine .  Otezla would not be a good choice for her.  She would not be a good candidate for anti-TNF's as she has recurrent squamous cell cancer.  I discussed the option of using Skyrizi or Tremfya.  Indications side effects contraindications were discussed at length.  A handout was given and consent was taken.  Patient wants to proceed with the [facility].  I will obtain labs once patient's infection is resolved.  Will start on Skyrizi once approved.  She will continue to use topical agents prescribed by her dermatologist.  Counseled patient that Skyrizi is a IL-23  inhibitor.  Counseled patient on purpose, proper use, and adverse effects of Skyrizi.  Reviewed the most common adverse effects including infection, URTIs, headache, fatigue, tinea infections, and injection site. Counseled patient that Skyrizi should be held for infection and prior to scheduled surgery.  Recommend annual influenza, PCV 15 or PCV20 or Pneumovax 23, and Shingrix as indicated. Advised that live vaccines should be avoided while taking Skyrizi.  Reviewed the importance of regular labs while on Skyrizi therapy.  Will monitor CBC and CMP 1 month after starting and then every 3 months routinely thereafter. Will monitor TB gold annually. Standing orders placed.  Provided patient with medication education material and answered all questions.  Patient voiced understanding.  Patient consented to Ryan.  Will upload consent into the media tab.  Reviewed storage instructions of Skyrizi.  Patient verbalized understanding.  Will apply for Skyrizi through at[employer] and update when we receive a response.  Dose will be Skyrizi 150 mg subcuteanously at week 0, week 4 then every 12 weeks thereafter.  Prescription pending lab results and insurance approval.   Joint stiffness-she has joint stiffness but no synovitis was noted.  Trochanteric bursitis, left hip-she has intermittent left trochanteric bursitis which she relates to sitting for a long time and traveling.  Plantar fasciitis-she had plantar fasciitis in the past but no episode in the last 3 years.  Ulcerative chronic pancolitis with other complication (HCC)-she continues to be  symptomatic despite being on mesalamine .  She is followed by gastroenterologist.  Viral upper respiratory tract infection -patient states she was tested for COVID and flu which were negative.  She is currently on Z-Pak and prednisone .  Other medical problems listed as follows:  Diverticulitis-in the past.  Gastroesophageal reflux disease without  esophagitis  History of pancreatitis  Primary hypertension  Mixed hyperlipidemia  Paroxysmal atrial fibrillation (HCC)  Coronary artery disease involving native coronary artery of native heart without angina pectoris - Status post stent placement  Polyneuropathy due to type 2 diabetes mellitus (HCC)  Diabetic nephropathy associated with type 2 diabetes mellitus (HCC)  Fatty liver  S/p nephrectomy-Partial right - 2013  Osteoporosis screening - January 22, 2020 BMD right femoral neck 0.929, T-score -0.8.  Vasovagal syncope  Anxiety about health  OSA (obstructive sleep apnea)  BMI 34.0-34.9,adult  Orders: Orders Placed This Encounter  Procedures   CBC with Differential/Platelet   Comprehensive metabolic panel with GFR   Sedimentation rate   Hepatitis B surface antigen   Hepatitis B core antibody, IgM   Hepatitis C antibody   QuantiFERON-TB Gold Plus   Serum protein electrophoresis with reflex   IgG, IgA, IgM   No orders of the defined types were placed in this encounter.   Face-to-face time spent with patient was over 60 minutes. Greater than 50% of time was spent in counseling and coordination of care.  Follow-Up Instructions: Return in about 2 months (around 08/04/2024) for Arthralgia, psoriasis.  All previous records from 2012-2015 were reviewed.  All previous labs were reviewed.   Maya Nash, MD  Note - This record has been created using Animal nutritionist.  Chart creation errors have been sought, but may not always  have been located. Such creation errors do not reflect on  the standard of medical care.

## 2024-06-04 NOTE — Telephone Encounter (Signed)
 Patient called the office asking why her follow up was scheduled since she has not yet started the biologics and was told it would be a little time with insurance. Offered to the move the appointment out 2 months patient denied doing so.

## 2024-06-04 NOTE — Patient Instructions (Signed)
 Medication Instructions:  No medication changes were made at this visit. Continue current regimen.   *If you need a refill on your cardiac medications before your next appointment, please call your pharmacy*  Lab Work: None ordered today. If you have labs (blood work) drawn today and your tests are completely normal, you will receive your results only by: MyChart Message (if you have MyChart) OR A paper copy in the mail If you have any lab test that is abnormal or we need to change your treatment, we will call you to review the results.  Testing/Procedures: None ordered today.  Follow-Up: At [facility], you and your health needs are our priority.  As part of our continuing mission to provide you with exceptional heart care, our providers are all part of one team.  This team includes your primary Cardiologist (physician) and Advanced Practice Providers or APPs (Physician Assistants and Nurse Practitioners) who all work together to provide you with the care you need, when you need it.  Your next appointment:   6 month(s)  Provider:   Glendia Ferrier, PA-C

## 2024-06-04 NOTE — Patient Instructions (Addendum)
 Call Skyrizi Complete if additional injection education is needed: [phone]  Injection tips: Store your medication in the fridge (where your milk goes). Allow your medication to reach to room temperature by placing on counter for at least 30 minutes before you inject. Clean the injection area before you inject with an alcohol swab or rubbing alcohol You can SELF-inject in the upper thigh or lower abdomen. Only a caregiver should administer in the back of your arm. Avoid scars, bruises, tender or swollen areas. Alternate injection sites each time. Discard your used medication in a sharps box or a laundry detergent/bleach bottle.  How to manage an injection site reaction: Remember the 5 C's: COUNTER - leave on the counter at least 30 minutes to bring medication to room temperature. This may help prevent stinging COLD - place something cold (like an ice gel pack or cold water bottle) on the injection site just before cleansing with alcohol. This may help reduce pain CLARITIN - use Claritin (generic name is loratadine) for the first two weeks of treatment or the day of, the day before, and the day after injecting. This will help to minimize injection site reactions CORTISONE CREAM - apply if injection site is irritated and itching CALL OFFICE - if injection site reaction is bigger than the size of your fist, looks infected, blisters, or if you develop hives     Risankizumab Injection What is this medication? RISANKIZUMAB (RIS an KIZ ue mab) treats autoimmune conditions, such as psoriasis, arthritis, Crohn disease, and ulcerative colitis. It works by slowing down an overactive immune system.  It is a monoclonal antibody. This medicine may be used for other purposes; ask your health care provider or pharmacist if you have questions. COMMON BRAND NAME(S): Skyrizi What should I tell my care team before I take this medication? They need to know if you have any of these conditions: Hepatic  disease Immune system problems Infection, such as tuberculosis (TB), bacterial, fungal or viral infections Recent or upcoming vaccine An unusual or allergic reaction to risankizumab, other medications, foods, dyes, or preservatives Pregnant or trying to get pregnant Breast-feeding How should I use this medication? This medication is injected into a vein or under the skin. It is given by your care team in a hospital or clinic setting. It may also be given at home. If you get this medication at home, you will be taught how to prepare and give it. Use exactly as directed. Take it as directed on the prescription label. Keep taking it unless your care team tells you to stop. If you use a pen, be sure to take off the outer needle cover before using the dose. It is important that you put your used needles and syringes in a special sharps container. Do not put them in a trash can. If you do not have a sharps container, call your pharmacist or care team to get one. A special MedGuide will be given to you by the pharmacist with each prescription and refill. Be sure to read this information carefully each time. This medication comes with INSTRUCTIONS FOR USE. Ask your pharmacist for directions on how to use this medication. Read the information carefully. Talk to your pharmacist or care team if you have questions. Talk to your care team about the use of this medication in children. Special care may be needed. Overdosage: If you think you have taken too much of this medicine contact a poison control center or emergency room at once. NOTE: This  medicine is only for you. Do not share this medicine with others. What if I miss a dose? It is important not to miss any doses. Talk to your care team about what to do if you miss a dose. What may interact with this medication? Do not take this medication with any of the following: Live vaccines This list may not describe all possible interactions. Give your health  care provider a list of all the medicines, herbs, non-prescription drugs, or dietary supplements you use. Also tell them if you smoke, drink alcohol, or use illegal drugs. Some items may interact with your medicine. What should I watch for while using this medication? Visit your care team for regular checks on your progress. Tell your care team if your symptoms do not start to get better or if they get worse. You will be tested for tuberculosis (TB) before you start this medication. If your care team prescribes any medication for TB, you should start taking the TB medication before starting this medication. Make sure to finish the full course of TB medication. This medication may increase your risk of getting an infection. Call your care team for advice if you get a fever, chills, sore throat, or other symptoms of a cold or flu. Do not treat yourself. Try to avoid being around people who are sick. This medication can decrease the response to a vaccine. If you need to get vaccinated, tell your care team if you have received this medication. Extra booster doses may be needed. Talk to your care team to see if a different vaccination schedule is needed. What side effects may I notice from receiving this medication? Side effects that you should report to your care team as soon as possible: Allergic reactions--skin rash, itching, hives, swelling of the face, lips, tongue, or throat Infection--fever, chills, cough, sore throat, wounds that don't heal, pain or trouble when passing urine, general feeling of discomfort or being unwell Liver injury--right upper belly pain, loss of appetite, nausea, light-colored stool, dark yellow or brown urine, yellowing skin or eyes, unusual weakness or fatigue Side effects that usually do not require medical attention (report to your care team if they continue or are bothersome): Fatigue Headache Pain, redness, or irritation at injection site Runny or stuffy nose Sore  throat This list may not describe all possible side effects. Call your doctor for medical advice about side effects. You may report side effects to FDA at 1-800-FDA-1088. Where should I keep my medication? Keep out of the reach of children and pets. Store in a refrigerator. Do not freeze. Protect from light. Keep it in the original carton until you are ready to take it. See product for storage information. Each product may have different instructions. Remove the dose from the carton about 30 to 45 minutes before it is time for you to take it. Get rid of any unused medication after the expiration date. To get rid of medications that are no longer needed or have expired: Take the medication to a medication take-back program. Check with your pharmacy or law enforcement to find a location. If you cannot return the medication, ask your pharmacist or care team how to get rid of this medication safely. NOTE: This sheet is a summary. It may not cover all possible information. If you have questions about this medicine, talk to your doctor, pharmacist, or health care provider.  2024 Elsevier/Gold Standard (2023-06-14 00:00:00)  Standing Labs We placed an order today for your standing lab work.  Please have your standing labs drawn in 1 month after starting Skyrizi and then every 3 months  Please have your labs drawn 2 weeks prior to your appointment so that the provider can discuss your lab results at your appointment, if possible.  Please note that you may see your imaging and lab results in MyChart before we have reviewed them. We will contact you once all results are reviewed. Please allow our office up to 72 hours to thoroughly review all of the results before contacting the office for clarification of your results.  WALK-IN LAB HOURS  Monday through Thursday from 8:00 am - 4:30 pm and Friday from 8:00 am-12:00 pm.  Patients with office visits requiring labs will be seen before walk-in labs.  You  may encounter longer than normal wait times. Please allow additional time. Wait times may be shorter on  Monday and Thursday afternoons.  We do not book appointments for walk-in labs. We appreciate your patience and understanding with our staff.   Labs are drawn by [facility]. Please bring your co-pay at the time of your lab draw.  You may receive a bill from [facility] for your lab work.  Please note if you are on Hydroxychloroquine and and an order has been placed for a Hydroxychloroquine level,  you will need to have it drawn 4 hours or more after your last dose.  If you wish to have your labs drawn at another location, please call the office 24 hours in advance so we can fax the orders.  The office is located at [address], [place], [place] [address]   If you have any questions regarding directions or hours of operation,  please call [phone].   As a reminder, please drink plenty of water prior to coming for your lab work. Thanks!   Vaccines You are taking a medication(s) that can suppress your immune system.  The following immunizations are recommended: Flu annually RSV Covid-19  Td/Tdap (tetanus, diphtheria, pertussis) every 10 years Pneumonia (Prevnar 15 then Pneumovax 23 at least 1 year apart.  Alternatively, can take Prevnar 20 without needing additional dose) Shingrix: 2 doses from 4 weeks to 6 months apart  Please check with your PCP to make sure you are up to date.  If you have signs or symptoms of an infection or start antibiotics: First, call your PCP for workup of your infection. Hold your medication through the infection, until you complete your antibiotics, and until symptoms resolve if you take the following: Injectable medication (Actemra, Benlysta, Cimzia, Cosentyx, Enbrel, Humira, Kevzara, Orencia, Remicade, Simponi, Stelara, Taltz, Tremfya) Methotrexate Leflunomide (Arava) Mycophenolate (Cellcept) Earma, Olumiant, or Rinvoq

## 2024-06-05 ENCOUNTER — Other Ambulatory Visit (HOSPITAL_COMMUNITY): Payer: Self-pay

## 2024-06-08 NOTE — Telephone Encounter (Signed)
 Fax had apparently failed to go through, sent to [facility] and then faxed via email. Will keep an eye out for receipt confirmation.

## 2024-06-16 ENCOUNTER — Encounter: Payer: Self-pay | Admitting: Rheumatology

## 2024-06-16 ENCOUNTER — Ambulatory Visit: Attending: Rheumatology | Admitting: Rheumatology

## 2024-06-16 VITALS — BP 120/79 | HR 69 | Temp 98.0°F | Resp 15 | Ht 65.0 in | Wt 212.8 lb

## 2024-06-16 DIAGNOSIS — L400 Psoriasis vulgaris: Secondary | ICD-10-CM

## 2024-06-16 DIAGNOSIS — K760 Fatty (change of) liver, not elsewhere classified: Secondary | ICD-10-CM

## 2024-06-16 DIAGNOSIS — R4589 Other symptoms and signs involving emotional state: Secondary | ICD-10-CM

## 2024-06-16 DIAGNOSIS — G4733 Obstructive sleep apnea (adult) (pediatric): Secondary | ICD-10-CM

## 2024-06-16 DIAGNOSIS — I10 Essential (primary) hypertension: Secondary | ICD-10-CM

## 2024-06-16 DIAGNOSIS — E782 Mixed hyperlipidemia: Secondary | ICD-10-CM

## 2024-06-16 DIAGNOSIS — M2560 Stiffness of unspecified joint, not elsewhere classified: Secondary | ICD-10-CM

## 2024-06-16 DIAGNOSIS — M7062 Trochanteric bursitis, left hip: Secondary | ICD-10-CM

## 2024-06-16 DIAGNOSIS — K5792 Diverticulitis of intestine, part unspecified, without perforation or abscess without bleeding: Secondary | ICD-10-CM

## 2024-06-16 DIAGNOSIS — I2510 Atherosclerotic heart disease of native coronary artery without angina pectoris: Secondary | ICD-10-CM

## 2024-06-16 DIAGNOSIS — L4050 Arthropathic psoriasis, unspecified: Secondary | ICD-10-CM | POA: Diagnosis not present

## 2024-06-16 DIAGNOSIS — R55 Syncope and collapse: Secondary | ICD-10-CM

## 2024-06-16 DIAGNOSIS — M722 Plantar fascial fibromatosis: Secondary | ICD-10-CM

## 2024-06-16 DIAGNOSIS — Z8719 Personal history of other diseases of the digestive system: Secondary | ICD-10-CM

## 2024-06-16 DIAGNOSIS — K51018 Ulcerative (chronic) pancolitis with other complication: Secondary | ICD-10-CM

## 2024-06-16 DIAGNOSIS — E1142 Type 2 diabetes mellitus with diabetic polyneuropathy: Secondary | ICD-10-CM

## 2024-06-16 DIAGNOSIS — E1121 Type 2 diabetes mellitus with diabetic nephropathy: Secondary | ICD-10-CM

## 2024-06-16 DIAGNOSIS — K219 Gastro-esophageal reflux disease without esophagitis: Secondary | ICD-10-CM

## 2024-06-16 DIAGNOSIS — Z79899 Other long term (current) drug therapy: Secondary | ICD-10-CM | POA: Diagnosis not present

## 2024-06-16 DIAGNOSIS — I480 Paroxysmal atrial fibrillation: Secondary | ICD-10-CM

## 2024-06-16 DIAGNOSIS — Z6834 Body mass index (BMI) 34.0-34.9, adult: Secondary | ICD-10-CM

## 2024-06-16 DIAGNOSIS — Z905 Acquired absence of kidney: Secondary | ICD-10-CM

## 2024-06-16 MED ORDER — CLOBETASOL PROPIONATE 0.05 % EX SOLN: 1.0000 | Freq: Two times a day (BID) | CUTANEOUS | 0 refills | Status: AC

## 2024-06-16 NOTE — Patient Instructions (Signed)
 Standing Labs We placed an order today for your standing lab work.   Please have your standing labs drawn in 1 month after starting Skyrizi and then every 3 months  Please have your labs drawn 2 weeks prior to your appointment so that the provider can discuss your lab results at your appointment, if possible.  Please note that you may see your imaging and lab results in MyChart before we have reviewed them. We will contact you once all results are reviewed. Please allow our office up to 72 hours to thoroughly review all of the results before contacting the office for clarification of your results.  WALK-IN LAB HOURS  Monday through Thursday from 8:00 am - 4:30 pm and Friday from 8:00 am-12:00 pm.  Patients with office visits requiring labs will be seen before walk-in labs.  You may encounter longer than normal wait times. Please allow additional time. Wait times may be shorter on  Monday and Thursday afternoons.  We do not book appointments for walk-in labs. We appreciate your patience and understanding with our staff.   Labs are drawn by [facility]. Please bring your co-pay at the time of your lab draw.  You may receive a bill from [facility] for your lab work.  Please note if you are on Hydroxychloroquine and and an order has been placed for a Hydroxychloroquine level,  you will need to have it drawn 4 hours or more after your last dose.  If you wish to have your labs drawn at another location, please call the office 24 hours in advance so we can fax the orders.  The office is located at [address], [place], [place] [address]   If you have any questions regarding directions or hours of operation,  please call [phone].   As a reminder, please drink plenty of water prior to coming for your lab work. Thanks!   Vaccines You are taking a medication(s) that can suppress your immune system.  The following immunizations are recommended: Flu annually RSV Covid-19  Td/Tdap  (tetanus, diphtheria, pertussis) every 10 years Pneumonia (Prevnar 15 then Pneumovax 23 at least 1 year apart.  Alternatively, can take Prevnar 20 without needing additional dose) Shingrix: 2 doses from 4 weeks to 6 months apart  Please check with your PCP to make sure you are up to date.   If you have signs or symptoms of an infection or start antibiotics: First, call your PCP for workup of your infection. Hold your medication through the infection, until you complete your antibiotics, and until symptoms resolve if you take the following: Injectable medication (Actemra, Benlysta, Cimzia, Cosentyx, Enbrel, Humira, Kevzara, Orencia, Remicade, Simponi, Stelara, Taltz, Tremfya) Methotrexate Leflunomide (Arava) Mycophenolate (Cellcept) Earma, Olumiant, or Rinvoq

## 2024-06-18 ENCOUNTER — Ambulatory Visit (HOSPITAL_COMMUNITY)
Admission: RE | Admit: 2024-06-18 | Discharge: 2024-06-18 | Disposition: A | Discharge status: Home / Self Care | Source: Ambulatory Visit | Attending: Physician Assistant | Admitting: Physician Assistant

## 2024-06-18 DIAGNOSIS — I6522 Occlusion and stenosis of left carotid artery: Secondary | ICD-10-CM | POA: Diagnosis present

## 2024-06-18 DIAGNOSIS — I2510 Atherosclerotic heart disease of native coronary artery without angina pectoris: Secondary | ICD-10-CM | POA: Diagnosis present

## 2024-06-18 DIAGNOSIS — I480 Paroxysmal atrial fibrillation: Secondary | ICD-10-CM | POA: Insufficient documentation

## 2024-06-18 DIAGNOSIS — R011 Cardiac murmur, unspecified: Secondary | ICD-10-CM | POA: Diagnosis present

## 2024-06-19 ENCOUNTER — Telehealth: Payer: Self-pay

## 2024-06-19 ENCOUNTER — Ambulatory Visit: Payer: Self-pay | Admitting: Physician Assistant

## 2024-06-19 DIAGNOSIS — I480 Paroxysmal atrial fibrillation: Secondary | ICD-10-CM

## 2024-06-19 DIAGNOSIS — R011 Cardiac murmur, unspecified: Secondary | ICD-10-CM

## 2024-06-19 DIAGNOSIS — I6523 Occlusion and stenosis of bilateral carotid arteries: Secondary | ICD-10-CM | POA: Insufficient documentation

## 2024-06-19 DIAGNOSIS — I2510 Atherosclerotic heart disease of native coronary artery without angina pectoris: Secondary | ICD-10-CM

## 2024-06-19 NOTE — Telephone Encounter (Signed)
 Patient called the office stating she had a missed call from someone with [facility] but they did not leave any voicemail, advised patient that I did not see where a call was placed to her from the office.

## 2024-06-24 ENCOUNTER — Ambulatory Visit: Payer: Self-pay | Admitting: Rheumatology

## 2024-06-24 DIAGNOSIS — R899 Unspecified abnormal finding in specimens from other organs, systems and tissues: Secondary | ICD-10-CM

## 2024-06-24 LAB — HEPATITIS B SURFACE ANTIGEN: Hepatitis B Surface Ag: NONREACTIVE

## 2024-06-24 LAB — CBC WITH DIFFERENTIAL/PLATELET
Absolute Lymphocytes: 1778 {cells}/uL (ref 850–3900)
Absolute Monocytes: 380 {cells}/uL (ref 200–950)
Basophils Absolute: 53 {cells}/uL (ref 0–200)
Basophils Relative: 0.7 %
Eosinophils Absolute: 213 {cells}/uL (ref 15–500)
Eosinophils Relative: 2.8 %
HCT: 39 % (ref 35.9–46.0)
Hemoglobin: 12.7 g/dL (ref 11.7–15.5)
MCH: 29.9 pg (ref 27.0–33.0)
MCHC: 32.6 g/dL (ref 31.6–35.4)
MCV: 91.8 fL (ref 81.4–101.7)
MPV: 10.1 fL (ref 7.5–12.5)
Monocytes Relative: 5 %
Neutro Abs: 5176 {cells}/uL (ref 1500–7800)
Neutrophils Relative %: 68.1 %
Platelets: 179 Thousand/uL (ref 140–400)
RBC: 4.25 Million/uL (ref 3.80–5.10)
RDW: 14 % (ref 11.0–15.0)
Total Lymphocyte: 23.4 %
WBC: 7.6 Thousand/uL (ref 3.8–10.8)

## 2024-06-24 LAB — COMPREHENSIVE METABOLIC PANEL WITH GFR
AG Ratio: 1.7 (calc) (ref 1.0–2.5)
ALT: 32 U/L — ABNORMAL HIGH (ref 6–29)
AST: 26 U/L (ref 10–35)
Albumin: 4 g/dL (ref 3.6–5.1)
Alkaline phosphatase (APISO): 79 U/L (ref 37–153)
BUN: 16 mg/dL (ref 7–25)
CO2: 27 mmol/L (ref 20–32)
Calcium: 9.1 mg/dL (ref 8.6–10.4)
Chloride: 100 mmol/L (ref 98–110)
Creat: 1 mg/dL (ref 0.50–1.03)
Globulin: 2.4 g/dL (ref 1.9–3.7)
Glucose, Bld: 134 mg/dL — ABNORMAL HIGH (ref 65–99)
Potassium: 4.4 mmol/L (ref 3.5–5.3)
Sodium: 137 mmol/L (ref 135–146)
Total Bilirubin: 0.9 mg/dL (ref 0.2–1.2)
Total Protein: 6.4 g/dL (ref 6.1–8.1)
eGFR: 65 mL/min/1.73m2 (ref >=60)

## 2024-06-24 LAB — PROTEIN ELECTROPHORESIS, SERUM, WITH REFLEX
Abnormal Protein Band1: 0.4 g/dL — ABNORMAL HIGH
Albumin ELP: 3.6 g/dL — ABNORMAL LOW (ref 3.8–4.8)
Alpha 1: 0.3 g/dL (ref 0.2–0.3)
Alpha 2: 0.7 g/dL (ref 0.5–0.9)
Beta 2: 0.6 g/dL — ABNORMAL HIGH (ref 0.2–0.5)
Beta Globulin: 0.4 g/dL (ref 0.4–0.6)
Gamma Globulin: 0.6 g/dL — ABNORMAL LOW (ref 0.8–1.7)
Total Protein: 6.3 g/dL (ref 6.1–8.1)

## 2024-06-24 LAB — HEPATITIS C ANTIBODY: Hepatitis C Ab: NONREACTIVE

## 2024-06-24 LAB — HEMOGLOBIN A1C
Hgb A1c MFr Bld: 6.8 % — ABNORMAL HIGH (ref <5.7)
Mean Plasma Glucose: 148 mg/dL
eAG (mmol/L): 8.2 mmol/L

## 2024-06-24 LAB — IGG, IGA, IGM
IgG (Immunoglobin G), Serum: 700 mg/dL (ref 600–1640)
IgM, Serum: 52 mg/dL (ref 50–300)
Immunoglobulin A: 437 mg/dL — ABNORMAL HIGH (ref 47–310)

## 2024-06-24 LAB — HEPATITIS B CORE ANTIBODY, IGM: Hep B C IgM: NONREACTIVE

## 2024-06-24 LAB — IFE INTERPRETATION

## 2024-06-24 LAB — QUANTIFERON-TB GOLD PLUS
Mitogen-NIL: >10 [IU]/mL
NIL: 0.02 [IU]/mL
QuantiFERON-TB Gold Plus: NEGATIVE
TB1-NIL: 0.01 [IU]/mL
TB2-NIL: 0 [IU]/mL

## 2024-06-24 LAB — SEDIMENTATION RATE: Sed Rate: 22 mm/h (ref 0–30)

## 2024-06-24 NOTE — Progress Notes (Signed)
 CBC normal, sed rate normal, hepatitis B and C nonreactive, TB Gold negative, hemoglobin A1c elevated at 6.8, LFTs mildly elevated at 32.  Glucose is elevated at 134..  IgM mildly elevated, IFE positive for IgA kappa monoclonal band.  Please refer to hematology for evaluation.

## 2024-06-25 ENCOUNTER — Other Ambulatory Visit: Payer: Self-pay | Admitting: Family Medicine

## 2024-06-25 DIAGNOSIS — Z1231 Encounter for screening mammogram for malignant neoplasm of breast: Secondary | ICD-10-CM

## 2024-06-29 ENCOUNTER — Other Ambulatory Visit: Payer: Self-pay | Admitting: *Deleted

## 2024-06-29 DIAGNOSIS — R899 Unspecified abnormal finding in specimens from other organs, systems and tissues: Secondary | ICD-10-CM

## 2024-07-02 ENCOUNTER — Telehealth (HOSPITAL_BASED_OUTPATIENT_CLINIC_OR_DEPARTMENT_OTHER): Payer: Self-pay | Admitting: *Deleted

## 2024-07-02 NOTE — Telephone Encounter (Signed)
° °  Name: Courtney Olson  DOB: 1966/04/30  MRN: [id number]   Primary Cardiologist: Arun K Thukkani, MD  Chart reviewed as part of pre-operative protocol coverage. [name] was last seen on 06/04/2024 by Dr. Wendel.  Per Dr. Wendel She has a low risk for cardiovascular complications around low risk procedures such as endoscopy. She can hold her Xarelto  for 24 hours.  Therefore, based on ACC/AHA guidelines, the patient would be an acceptable risk for the planned procedure without further cardiovascular testing.   I will route this recommendation to the requesting party via Epic fax function and remove from pre-op pool. Please call with questions.  Barnie Hila, NP 07/02/2024, 12:09 PM

## 2024-07-02 NOTE — Telephone Encounter (Signed)
° °  Pre-operative Risk Assessment    Patient Name: Courtney Olson  DOB: Feb 02, 1966 MRN: [id number]   Date of last office visit: 06/04/24 DR/ WENDEL Date of next office visit: NONE   Request for Surgical Clearance    Procedure:  ENDOSCOPY  Date of Surgery:  Clearance 08/11/24                                Surgeon:  DR. DIANNA Surgeon's Group or Practice Name:  [facility] Phone number:  [phone] Fax number:  [phone]   Type of Clearance Requested:   - Medical  - Pharmacy:  Hold Rivaroxaban  (Xarelto )     Type of Anesthesia:  PROPOFOL    Additional requests/questions:    Signed, Evaline Waltman   07/02/2024, 9:49 AM

## 2024-07-02 NOTE — Telephone Encounter (Signed)
 Please advise holding Xarelto  prior to endoscopy on 1/27.  Last labs December 2025  Thank you!  DW

## 2024-07-02 NOTE — Telephone Encounter (Signed)
 Dr. Wendel,   You saw this patient on 11/20. Per office protocol, will you please comment on medical clearance for endoscopy on 1/27?  Please route your response to P [facility] Preop. I will communicate with requesting office once you have given recommendations.   Thank you!  Barnie Hila, NP

## 2024-07-07 ENCOUNTER — Telehealth: Payer: Self-pay | Admitting: *Deleted

## 2024-07-07 NOTE — Telephone Encounter (Signed)
 Patient contacted the office to follow up on PA for [facility]. Please reach out to patient to advise.

## 2024-07-17 ENCOUNTER — Ambulatory Visit: Admitting: Physician Assistant

## 2024-07-23 NOTE — Telephone Encounter (Signed)
 Called [employer] for update on Skyrizi prior authorization that was faxed. Per rep, they never received fax and also don't see that patient was ever a member with [employer].  Called patient for insurance information as there is nothing scanned into chart. Left VM requesting return call with insurance  Resubmitted to [employer] via CMM Key: MERILYNN Sherry Pennant, PharmD, MPH, BCPS, CPP Clinical Pharmacist

## 2024-07-27 ENCOUNTER — Inpatient Hospital Stay

## 2024-07-27 ENCOUNTER — Ambulatory Visit

## 2024-07-27 ENCOUNTER — Telehealth: Payer: Self-pay | Admitting: Rheumatology

## 2024-07-27 ENCOUNTER — Ambulatory Visit
Admission: RE | Admit: 2024-07-27 | Discharge: 2024-07-27 | Disposition: A | Source: Ambulatory Visit | Attending: Family Medicine | Admitting: Family Medicine

## 2024-07-27 ENCOUNTER — Inpatient Hospital Stay: Attending: Nurse Practitioner | Admitting: Nurse Practitioner

## 2024-07-27 ENCOUNTER — Other Ambulatory Visit: Payer: Self-pay | Admitting: *Deleted

## 2024-07-27 VITALS — BP 122/74 | HR 75 | Temp 98.0°F | Resp 18 | Ht 65.0 in | Wt 208.6 lb

## 2024-07-27 DIAGNOSIS — D472 Monoclonal gammopathy: Secondary | ICD-10-CM

## 2024-07-27 DIAGNOSIS — R899 Unspecified abnormal finding in specimens from other organs, systems and tissues: Secondary | ICD-10-CM

## 2024-07-27 DIAGNOSIS — Z1231 Encounter for screening mammogram for malignant neoplasm of breast: Secondary | ICD-10-CM

## 2024-07-27 LAB — SAVE SMEAR(SSMR), FOR PROVIDER SLIDE REVIEW

## 2024-07-27 LAB — CBC WITH DIFFERENTIAL (CANCER CENTER ONLY)
Abs Immature Granulocytes: 0.02 K/uL (ref 0.00–0.07)
Basophils Absolute: 0.1 K/uL (ref 0.0–0.1)
Basophils Relative: 1 %
Eosinophils Absolute: 0.2 K/uL (ref 0.0–0.5)
Eosinophils Relative: 3 %
HCT: 38.1 % (ref 36.0–46.0)
Hemoglobin: 12.8 g/dL (ref 12.0–15.0)
Immature Granulocytes: 0 %
Lymphocytes Relative: 26 %
Lymphs Abs: 1.5 K/uL (ref 0.7–4.0)
MCH: 30.7 pg (ref 26.0–34.0)
MCHC: 33.6 g/dL (ref 30.0–36.0)
MCV: 91.4 fL (ref 80.0–100.0)
Monocytes Absolute: 0.4 K/uL (ref 0.1–1.0)
Monocytes Relative: 6 %
Neutro Abs: 3.7 K/uL (ref 1.7–7.7)
Neutrophils Relative %: 64 %
Platelet Count: 181 K/uL (ref 150–400)
RBC: 4.17 MIL/uL (ref 3.87–5.11)
RDW: 14.3 % (ref 11.5–15.5)
WBC Count: 5.8 K/uL (ref 4.0–10.5)
nRBC: 0 % (ref 0.0–0.2)

## 2024-07-27 LAB — LACTATE DEHYDROGENASE: LDH: 225 U/L (ref 105–235)

## 2024-07-27 LAB — CMP (CANCER CENTER ONLY)
ALT: 35 U/L (ref 0–44)
AST: 34 U/L (ref 15–41)
Albumin: 4.3 g/dL (ref 3.5–5.0)
Alkaline Phosphatase: 85 U/L (ref 38–126)
Anion gap: 8 (ref 5–15)
BUN: 19 mg/dL (ref 6–20)
CO2: 32 mmol/L (ref 22–32)
Calcium: 10.1 mg/dL (ref 8.9–10.3)
Chloride: 101 mmol/L (ref 98–111)
Creatinine: 1.14 mg/dL — ABNORMAL HIGH (ref 0.44–1.00)
GFR, Estimated: 56 mL/min — ABNORMAL LOW
Glucose, Bld: 178 mg/dL — ABNORMAL HIGH (ref 70–99)
Potassium: 4.8 mmol/L (ref 3.5–5.1)
Sodium: 141 mmol/L (ref 135–145)
Total Bilirubin: 1.2 mg/dL (ref 0.0–1.2)
Total Protein: 6.9 g/dL (ref 6.5–8.1)

## 2024-07-27 NOTE — Telephone Encounter (Signed)
 Patient came in giving her insurance card and RX card   Pt did not have RX card just a picture  RX Group # : [id number] RX BIN: [id number] RX PCN: [id number]  Member ID: [id number]   Pt stated she is very upset it took this long to have someone contact her about this due to her being in pain. Pt stated she is having psoriasis all over

## 2024-07-27 NOTE — Telephone Encounter (Signed)
 Received a fax regarding Prior Authorization from [facility] for SKYRIZI SQ. Authorization has been DENIED because patient must try and fail or be unable to take BOTH ustekinumab [place]on, [place], [place]) and adalimumab (Hadlima, Simlandi).  Case #: [id number]  Patient has history of heart failure so can argue against TNFi however do not see any particular contraindication to ustekinumab (it is a pre-filled syringe so if patient has diagnosed needle-phobia may be able to argue against use)  Niki Cosman, PharmD, MPH, BCPS, CPP Clinical Pharmacist

## 2024-07-27 NOTE — Progress Notes (Signed)
 New Hematology/Oncology Consult   Requesting MD: Dr. Maya Nash  [phone]      Reason for Consult: Abnormal IFE  HPI: Courtney Olson is a 59 year old woman referred for evaluation of abnormal labs 06/16/2024.  Serum protein electrophoresis showed an M spike 0.4 g/dL; immunofixation with IgA kappa monoclonal band present; IgA elevated 437, IgG 700 and IgM 52.  Chemistry panel with normal creatinine and calcium ; CBC unremarkable.   Past Medical History:  Diagnosis Date   Aortic calcification    Atrial tachycardia    Atrophic vaginitis 01/2006   Bradycardia 05/2014   CAD (coronary artery disease)    cardiac cath 05/21/2019 showing 805 mRCA, 60% D1 and D2 that were very small and patent LAD and LCx.  She underwent DES to the [facility] with residual 25% stenosis in the dRCA   Chronic pancolonic ulcerative colitis (HCC) 2015   unknown if chronic.   CKD (chronic kidney disease), stage II    Cytomegaloviral hepatitis (HCC) 2005   Diabetes mellitus without complication (HCC) 01/2006   on Ace inhibitor to protect kidneys   Gallstones 01/2013   GERD (gastroesophageal reflux disease)    Heart failure (HCC) 10/26/2020   Hyperlipidemia    Hypertension    Kidney tumor 03/2013   Right   NSVT (nonsustained ventricular tachycardia) (HCC)    Obesity    OSA (obstructive sleep apnea)    PAF (paroxysmal atrial fibrillation) (HCC)    Pancreatitis 2014   Postmenopausal    Premature atrial contractions    PVC's (premature ventricular contractions)    RA (rheumatoid arthritis) (HCC)    Syncope and collapse    Ulcerative colitis (HCC) 2015     Past Surgical History:  Procedure Laterality Date   CATARACT EXTRACTION Bilateral    CESAREAN SECTION     CHOLECYSTECTOMY  01/13/2013   COLONOSCOPY  07/16/2013   Colitis   CORONARY LITHOTRIPSY N/A 03/02/2022   Procedure: CORONARY LITHOTRIPSY;  Surgeon: Dann Candyce RAMAN, MD;  Location: [facility] INVASIVE CV LAB;  Service: Cardiovascular;  Laterality:  N/A;   CORONARY STENT INTERVENTION N/A 05/21/2019   Procedure: CORONARY STENT INTERVENTION;  Surgeon: Dann Candyce RAMAN, MD;  Location: [facility] INVASIVE CV LAB;  Service: Cardiovascular;  Laterality: N/A;   CORONARY STENT INTERVENTION Right 06/27/2021   Procedure: CORONARY STENT INTERVENTION;  Surgeon: Dann Candyce RAMAN, MD;  Location: [facility] INVASIVE CV LAB;  Service: Cardiovascular;  Laterality: Right;   CORONARY STENT INTERVENTION N/A 03/02/2022   Procedure: CORONARY STENT INTERVENTION;  Surgeon: Dann Candyce RAMAN, MD;  Location: [facility] INVASIVE CV LAB;  Service: Cardiovascular;  Laterality: N/A;   CORONARY ULTRASOUND/IVUS N/A 03/02/2022   Procedure: Intravascular Ultrasound/IVUS;  Surgeon: Dann Candyce RAMAN, MD;  Location: [facility] INVASIVE CV LAB;  Service: Cardiovascular;  Laterality: N/A;   DILATION AND CURETTAGE OF UTERUS  07/16/2000   EYE SURGERY Bilateral    12/2023, 01/2024   HERNIA REPAIR  01/13/2013   Kidney and Bladder Stent and tube placed Right 03/16/2013   LEFT HEART CATH AND CORONARY ANGIOGRAPHY N/A 06/27/2021   Procedure: LEFT HEART CATH AND CORONARY ANGIOGRAPHY;  Surgeon: Dann Candyce RAMAN, MD;  Location: [facility] INVASIVE CV LAB;  Service: Cardiovascular;  Laterality: N/A;   LEFT HEART CATH AND CORONARY ANGIOGRAPHY N/A 03/02/2022   Procedure: LEFT HEART CATH AND CORONARY ANGIOGRAPHY;  Surgeon: Dann Candyce RAMAN, MD;  Location: [facility] INVASIVE CV LAB;  Service: Cardiovascular;  Laterality: N/A;   LEFT HEART CATH AND CORONARY ANGIOGRAPHY N/A 11/27/2023   Procedure: LEFT HEART CATH AND  CORONARY ANGIOGRAPHY;  Surgeon: Darron Deatrice LABOR, MD;  Location: [facility] INVASIVE CV LAB;  Service: Cardiovascular;  Laterality: N/A;   PARTIAL NEPHRECTOMY Right 02/13/2013   Tumor Removal    Procedure to remove kidney and bladder stent and tube Right 04/15/2013   RIGHT/LEFT HEART CATH AND CORONARY ANGIOGRAPHY N/A 05/21/2019   Procedure: RIGHT/LEFT HEART CATH AND CORONARY ANGIOGRAPHY;  Surgeon: Dann Candyce RAMAN, MD;  Location: [facility] INVASIVE CV LAB;  Service: Cardiovascular;  Laterality: N/A;   TOTAL ABDOMINAL HYSTERECTOMY W/ BILATERAL SALPINGOOPHORECTOMY  07/16/2000   Secondary to AUB    Current Medications[1]:    Allergies[2]:  FH: Father has dementia. Sister has breast cancer. Multiple family members with diabetes.   SOCIAL HISTORY: She lives in [place] with her husband.  She has 3 children.  She owns a travel business.  No tobacco use.  Social EtOH intake.  Review of Systems: No fevers or sweats.  Good appetite.  Weight varies slightly.  No bleeding.  No recurrent severe infections.  She recently had bronchitis.  She reports multiple plaque-like skin lesions.  Hair loss.  Intermittent cough.  No shortness of breath.  Periodic dysphagia.  No nausea or vomiting.  No change in bowel habits.  No urinary symptoms.  She reports neuropathy in the feet characterized by intermittent numbness/tingling.  This has been occurring for several years.  She occasionally notes numbness in the fingertips.  Physical Exam:  Blood pressure 122/74, pulse 75, temperature 98 F (36.7 C), temperature source Temporal, resp. rate 18, height 5' 5 (1.651 m), weight 208 lb 9.6 oz (94.6 kg), last menstrual period 07/16/2000, SpO2 100%.  HEENT: No thrush or ulcers. Lungs: Lungs clear bilaterally. Cardiac: Regular rate and rhythm. Abdomen: No hepatosplenomegaly. Vascular: No leg edema. Lymph nodes: No palpable cervical, supraclavicular, axillary or inguinal lymph nodes. Neurologic: Alert and oriented. Skin: Multiple plaque-like skin lesions mainly on extremities, a few small lesions on the back.  LABS:   Recent Labs    07/27/24 1409  WBC 5.8  HGB 12.8  HCT 38.1  PLT 181    No results for input(s): NA, K, CL, CO2, GLUCOSE, BUN, CREATININE, CALCIUM  in the last 72 hours.    RADIOLOGY:  No results found.  Assessment and Plan:   Serum monoclonal protein Psoriatic  arthritis Psoriasis Osteoarthritis History of seronegative rheumatoid arthritis treated in the past with methotrexate and Enbrel, medications discontinued when she was found to have a kidney mass History of right kidney mass status post partial right nephrectomy CAD Diabetes  Ms. Hazan was referred for evaluation of a low level serum monoclonal protein.  She likely has monoclonal gammopathy of unknown significance.  It is possible the monoclonal protein is related to a rheumatologic condition.  We will check serum light chains.  Our plan at present is for observation.  She will return for an office visit and labs in approximately 4 months.  She will contact the office in the interim with any problems.  Patient seen with Dr. Cloretta.    Olam Ned, NP 07/27/2024, 2:38 PM This was a shared visit with Olam Ned.  Ms. Wisman was interviewed and examined.  She is referred for evaluation of a low level serum monoclonal protein.  She has a complex medical history including psoriasis/psoriatic arthritis, inflammatory bowel disease, coronary artery disease, diabetic nephropathy, diabetic neuropathy, and paroxysmal atrial fibrillation.  We discussed the differential diagnosis with Ms. Bales.  The serum monoclonal protein likely represents a monoclonal gammopathy of unknown significance versus  a monoclonal protein related to a systemic inflammatory condition.  She does not have other laboratory findings such as anemia, hypercalcemia, renal failure, and low immunoglobulin levels to suggest a plasma cell dyscrasia.  There is no clinical evidence of a lymphoproliferative disorder.  I recommend observation with laboratory follow-up in 4 months.  She plans to continue follow-up with Dr. Dolphus for management of psoriatic arthropathy.  Arvella Hof, MD    [1]  Current Outpatient Medications:    atorvastatin  (LIPITOR) 40 MG tablet, Take 1 tablet (40 mg total) by mouth daily., Disp: 90 tablet, Rfl: 3    BIOTIN PO, Take 1 tablet by mouth daily., Disp: , Rfl:    Cholecalciferol (VITAMIN D3 PO), Take 1 capsule by mouth daily., Disp: , Rfl:    clobetasol  (TEMOVATE ) 0.05 % external solution, Apply 1 Application topically 2 (two) times daily., Disp: 50 mL, Rfl: 0   clobetasol  cream (TEMOVATE ) 0.05 %, Apply 1 Application topically 2 (two) times daily., Disp: , Rfl:    Cyanocobalamin (VITAMIN B12 PO), Take 1 tablet by mouth daily., Disp: , Rfl:    ezetimibe  (ZETIA ) 10 MG tablet, Take 1 tablet (10 mg total) by mouth daily., Disp: 90 tablet, Rfl: 3   fluticasone (FLONASE) 50 MCG/ACT nasal spray, Place 1 spray into both nostrils daily as needed for allergies., Disp: , Rfl:    HUMALOG KWIKPEN 100 UNIT/ML KwikPen, Inject 20 Units into the skin in the morning and at bedtime., Disp: , Rfl:    hydrocortisone 2.5 % cream, Apply 1 Application topically as needed (for itching)., Disp: , Rfl:    isosorbide  mononitrate (IMDUR ) 30 MG 24 hr tablet, Take 1 tablet (30 mg total) by mouth at bedtime., Disp: 90 tablet, Rfl: 0   magnesium  oxide (MAG-OX) 400 MG tablet, Take 1 tablet (400 mg total) by mouth in the morning, at noon, and at bedtime., Disp: 270 tablet, Rfl: 3   mesalamine  (LIALDA ) 1.2 g EC tablet, Take 1.2 g by mouth daily. , Disp: , Rfl:    metoprolol  succinate (TOPROL -XL) 25 MG 24 hr tablet, Take 1 tablet (25 mg total) by mouth daily., Disp: 90 tablet, Rfl: 3   Multiple Vitamin (MULTIVITAMIN) tablet, Take 1 tablet by mouth daily. Pack Heart healthy, Disp: , Rfl:    nitroGLYCERIN  (NITROSTAT ) 0.4 MG SL tablet, Place 1 tablet (0.4 mg total) under the tongue every 5 (five) minutes as needed., Disp: 25 tablet, Rfl: 2   ramipril  (ALTACE ) 2.5 MG capsule, Take 1 capsule (2.5 mg total) by mouth daily., Disp: 90 capsule, Rfl: 3   rivaroxaban  (XARELTO ) 20 MG TABS tablet, Take 1 tablet (20 mg total) by mouth daily., Disp: 90 tablet, Rfl: 3   TOUJEO  SOLOSTAR 300 UNIT/ML Solostar Pen, Inject 20 Units into the skin in the  morning and at bedtime., Disp: , Rfl:    TRULICITY  4.5 MG/0.5ML SOAJ, Inject 1.5 mg into the skin once a week. On Friday, Disp: , Rfl:    ALPRAZolam  (XANAX ) 0.25 MG tablet, Take 1 tablet (0.25 mg total) by mouth 2 (two) times daily as needed. (Patient not taking: Reported on 07/27/2024), Disp: 15 tablet, Rfl: 0   EPINEPHrine  0.3 mg/0.3 mL IJ SOAJ injection, Inject 0.3 mLs into the skin as needed for anaphylaxis. (Patient not taking: Reported on 07/27/2024), Disp: , Rfl: 2   furosemide  (LASIX ) 20 MG tablet, Take 1 tablet (20 mg total) by mouth as needed for fluid or edema. (Patient not taking: Reported on 07/27/2024), Disp: 30 tablet, Rfl: 11  ketoconazole (NIZORAL) 2 % cream, Apply 1 Application topically 2 (two) times daily as needed. (Patient not taking: Reported on 07/27/2024), Disp: , Rfl:  [2]  Allergies Allergen Reactions   Orange (Diagnostic) Anaphylaxis, Diarrhea, Hives, Itching, Nausea And Vomiting, Palpitations, Rash, Shortness Of Breath and Swelling   Empagliflozin Other (See Comments)    burning   Metformin Hcl Other (See Comments)    Gi upset   Nitrofurantoin Itching    Ears burning   Bactrim [Sulfamethoxazole-Trimethoprim] Rash

## 2024-07-27 NOTE — Telephone Encounter (Signed)
 Ok to apply for Ustekinumab.

## 2024-07-28 LAB — KAPPA/LAMBDA LIGHT CHAINS
Kappa free light chain: 57.9 mg/L — ABNORMAL HIGH (ref 3.3–19.4)
Kappa, lambda light chain ratio: 4.02 — ABNORMAL HIGH (ref 0.26–1.65)
Lambda free light chains: 14.4 mg/L (ref 5.7–26.3)

## 2024-07-29 ENCOUNTER — Encounter: Payer: Self-pay | Admitting: Nurse Practitioner

## 2024-07-29 NOTE — Telephone Encounter (Signed)
 Received clearance from cardiology to proceed with TNFi. Patient previously on Enbrel until 2014 but discontinued with renal mass. Now s/p nephrectomy.

## 2024-07-29 NOTE — Telephone Encounter (Signed)
 ATC patient regarding Nelma denial to confirm ok with Stelara. Unable to reach. Left VM requesting return call. MyChart message  Sherry Pennant, PharmD, MPH, BCPS, CPP Clinical Pharmacist

## 2024-07-30 ENCOUNTER — Telehealth: Payer: Self-pay

## 2024-07-30 DIAGNOSIS — Z79899 Other long term (current) drug therapy: Secondary | ICD-10-CM

## 2024-07-30 DIAGNOSIS — L4050 Arthropathic psoriasis, unspecified: Secondary | ICD-10-CM

## 2024-07-30 DIAGNOSIS — L400 Psoriasis vulgaris: Secondary | ICD-10-CM

## 2024-07-30 NOTE — Telephone Encounter (Signed)
 Patient is Stelara new start  Submitted an URGENT Prior Authorization request to [employer] for STELARA SQ via CoverMyMeds. Will update once we receive a response.  Key: [id number]

## 2024-07-30 NOTE — Telephone Encounter (Signed)
 Hi Devki, I was reviewing patient's chart.  Patient has history of recurrent melanoma for that reason we decided not to use anti-TNF's.  If insurance still denies the Skyrizi then we should use another IL-23 drug.

## 2024-08-03 ENCOUNTER — Telehealth: Payer: Self-pay | Admitting: *Deleted

## 2024-08-03 ENCOUNTER — Encounter: Payer: Self-pay | Admitting: *Deleted

## 2024-08-03 NOTE — Telephone Encounter (Signed)
 Left VM that she has recalled that her aunt died from multiple myeloma

## 2024-08-05 ENCOUNTER — Other Ambulatory Visit: Payer: Self-pay | Admitting: Physician Assistant

## 2024-08-05 DIAGNOSIS — I480 Paroxysmal atrial fibrillation: Secondary | ICD-10-CM

## 2024-08-05 NOTE — Telephone Encounter (Signed)
 Xarelto  20mg  refill request received. Pt is 59 years old, weight-94.6kg, Crea-1.14 on 07/27/24, last seen by Dr. Wendel on 06/04/24, Diagnosis-Afib, CrCl-80.33 mL/min; Dose is appropriate based on dosing criteria. Will send in refill to requested pharmacy.

## 2024-08-06 ENCOUNTER — Telehealth: Payer: Self-pay | Admitting: *Deleted

## 2024-08-06 NOTE — Telephone Encounter (Signed)
 Received a fax regarding Prior Authorization from [employer] for STELARA SQ. Authorization has been DENIED because patient must try biosimilaris [name], [name], [name]).  Submitted an URGENT Prior Authorization request to [employer] for STARJEMZA (USTEKINUMAB-HMNY) via FAX on CoverMyMeds. Will update once we receive a response.  Key: [id number]

## 2024-08-06 NOTE — Telephone Encounter (Signed)
 Per Olam Ned, NP  Please let her know Dr. Cloretta has reviewed labs-he does not feel labs are consistent with multiple myeloma.  Follow-up as scheduled.

## 2024-08-07 ENCOUNTER — Other Ambulatory Visit (HOSPITAL_COMMUNITY): Payer: Self-pay

## 2024-08-07 MED ORDER — STARJEMZA 45 MG/0.5ML ~~LOC~~ SOSY: 45.0000 mg | PREFILLED_SYRINGE | Freq: Once | SUBCUTANEOUS | 0 refills | Status: DC

## 2024-08-07 NOTE — Telephone Encounter (Signed)
 Received notification from [facility] regarding a prior authorization for STARJEMZA  (USTEKINUMAB-HMNY ). Authorization has been APPROVED from 08/06/2024 to 02/03/2025. Approval letter sent to scan center.  Patient must fill through [employer]. Phone: [phone] Fax: [phone]  Authorization # [id number]  Sherry Pennant, PharmD, MPH, BCPS, CPP Clinical Pharmacist

## 2024-08-11 NOTE — Telephone Encounter (Signed)
 Verbal clarification for Starjemza  rx provided.

## 2024-08-13 MED ORDER — STARJEMZA 45 MG/0.5ML ~~LOC~~ SOSY: 45.0000 mg | PREFILLED_SYRINGE | Freq: Once | SUBCUTANEOUS | 0 refills | Status: AC

## 2024-08-13 MED ORDER — STARJEMZA 45 MG/0.5ML ~~LOC~~ SOSY: 45.0000 mg | PREFILLED_SYRINGE | Freq: Once | SUBCUTANEOUS | 0 refills | Status: DC

## 2024-08-13 NOTE — Addendum Note (Signed)
 Addended by: DAYNE SHERRY RAMAN on: 08/13/2024 02:33 PM   Modules accepted: Orders

## 2024-08-20 ENCOUNTER — Telehealth: Payer: Self-pay

## 2024-08-20 NOTE — Telephone Encounter (Signed)
 Patient contacted the office and left a voicemail stated she got a call that Starjemza  had not been approved and was asking that something be sent to her via email or texted to her.   Contacted patient to advise that we do not send things via email or text and I could send a fax , patient stated that husband talked to them an had gotten it situated.

## 2024-09-15 ENCOUNTER — Ambulatory Visit: Admitting: Physician Assistant

## 2024-11-24 ENCOUNTER — Inpatient Hospital Stay: Admitting: Nurse Practitioner

## 2024-11-24 ENCOUNTER — Inpatient Hospital Stay
